# Patient Record
Sex: Male | Born: 1955 | Race: Black or African American | Hispanic: No | Marital: Married | Smoking: Former smoker
Health system: Southern US, Community
[De-identification: ages and names within clinical notes are randomized; demographics above are authoritative.]

## PROBLEM LIST (undated history)

## (undated) DIAGNOSIS — I10 Essential (primary) hypertension: Secondary | ICD-10-CM

## (undated) DIAGNOSIS — M5430 Sciatica, unspecified side: Secondary | ICD-10-CM

## (undated) DIAGNOSIS — J329 Chronic sinusitis, unspecified: Secondary | ICD-10-CM

## (undated) HISTORY — PX: HIP SURGERY: SHX245

## (undated) HISTORY — PX: BACK SURGERY: SHX140

## (undated) HISTORY — PX: HERNIA REPAIR: SHX51

## (undated) HISTORY — PX: NECK SURGERY: SHX720

---

## 2010-03-04 ENCOUNTER — Ambulatory Visit: Payer: Self-pay | Admitting: Interventional Radiology

## 2010-03-04 ENCOUNTER — Emergency Department (HOSPITAL_BASED_OUTPATIENT_CLINIC_OR_DEPARTMENT_OTHER): Admission: EM | Admit: 2010-03-04 | Discharge: 2010-03-04 | Payer: Self-pay | Admitting: Emergency Medicine

## 2012-07-23 ENCOUNTER — Emergency Department (HOSPITAL_BASED_OUTPATIENT_CLINIC_OR_DEPARTMENT_OTHER)
Admission: EM | Admit: 2012-07-23 | Discharge: 2012-07-23 | Disposition: A | Discharge status: Home / Self Care | Payer: Self-pay | Attending: Emergency Medicine | Admitting: Emergency Medicine

## 2012-07-23 ENCOUNTER — Encounter (HOSPITAL_BASED_OUTPATIENT_CLINIC_OR_DEPARTMENT_OTHER): Payer: Self-pay | Admitting: *Deleted

## 2012-07-23 DIAGNOSIS — H6120 Impacted cerumen, unspecified ear: Secondary | ICD-10-CM | POA: Insufficient documentation

## 2012-07-23 DIAGNOSIS — R42 Dizziness and giddiness: Secondary | ICD-10-CM | POA: Insufficient documentation

## 2012-07-23 MED ORDER — MECLIZINE HCL 12.5 MG PO TABS: 25.0000 mg | ORAL_TABLET | Freq: Three times a day (TID) | ORAL | Status: AC | PRN

## 2012-07-23 NOTE — ED Notes (Signed)
States that hes been dizzy all day, everytime he stands up "everything is spinning". Patient states that his ears have been "stopped up" for about 3 weeks. Denies N/V

## 2012-07-23 NOTE — ED Provider Notes (Signed)
History  This chart was scribed for Rolan Bucco, MD by Shari Heritage. The patient was seen in room MH12/MH12. Patient's care was started at 1632.     CSN: [id number]  Arrival date & time 07/23/12  1632   First MD Initiated Contact with Patient 07/23/12 1647      Chief Complaint  Patient presents with  . Dizziness    (Consider location/radiation/quality/duration/timing/severity/associated sxs/prior treatment) The history is provided by the patient. No language interpreter was used.   Phillip Hughes is a 56 y.o. male who presents to the Emergency Department complaining of dizziness onset 2 days ago. Patient says that when he woke up on Friday and tried to stand up, the room started to spin. The dizziness has been persistent today. Patient says that when he makes a movement to stand or when bends over at the waist, the spinning starts. He has also been experiencing ear congestion for 3 weeks, but there is no otalgia. Feels that his ears are "stopped up" Patient says that his hearing is normal. He states that he sometimes hears ringing in his left ear. There is no popping sensation. He says that he has balance problems, only with the spinning. No numbness or weakness to extremities.  No speech problems.  No Headache. He denies any obvious head injuries or trauma. No chest pain, abdominal pain, HA, nausea, vomiting, numbness, weakness or tingling. Patient denies nasal or sinus congestion. He has a history of osteoarthritis in his right hip. Patient reports no other significant past medical, surgical or family history.  No PCP  History reviewed. No pertinent past medical history.  History reviewed. No pertinent past surgical history.  No family history on file.  History  Substance Use Topics  . Smoking status: Never Smoker   . Smokeless tobacco: Not on file  . Alcohol Use: No      Review of Systems  Constitutional: Negative for fever, chills, diaphoresis and fatigue.  HENT: Negative  for ear pain, congestion, rhinorrhea and sneezing.   Eyes: Negative.   Respiratory: Negative for cough, chest tightness and shortness of breath.   Cardiovascular: Negative for chest pain and leg swelling.  Gastrointestinal: Negative for nausea, vomiting, abdominal pain, diarrhea and blood in stool.  Genitourinary: Negative for frequency, hematuria, flank pain and difficulty urinating.  Musculoskeletal: Negative for back pain and arthralgias.  Skin: Negative for rash.  Neurological: Positive for dizziness. Negative for speech difficulty, weakness, numbness and headaches.    Allergies  Review of patient's allergies indicates no known allergies.  Home Medications   Current Outpatient Rx  Name Route Sig Dispense Refill  . NAPROXEN 500 MG PO TABS Oral Take 500 mg by mouth 2 (two) times daily as needed. For pain    . NAPROXEN SODIUM 220 MG PO TABS Oral Take 440 mg by mouth daily as needed. For hip pain    . MECLIZINE HCL 12.5 MG PO TABS Oral Take 2 tablets (25 mg total) by mouth 3 (three) times daily as needed. 30 tablet 0    BP 130/79  Pulse 85  Temp 97.7 F (36.5 C) (Oral)  Resp 20  SpO2 99%  Physical Exam  Constitutional: He is oriented to person, place, and time. He appears well-developed and well-nourished.  HENT:  Head: Normocephalic and atraumatic.       Cerumen impactions to TMs bilaterally.  Eyes: Pupils are equal, round, and reactive to light.       Mild horizontal nystagmus, no vertical nystagmus  Neck: Normal  range of motion. Neck supple.  Cardiovascular: Normal rate, regular rhythm and normal heart sounds.   Pulmonary/Chest: Effort normal and breath sounds normal. No respiratory distress. He has no wheezes. He has no rales. He exhibits no tenderness.  Abdominal: Soft. Bowel sounds are normal. There is no tenderness. There is no rebound and no guarding.  Musculoskeletal: Normal range of motion. He exhibits no edema.  Lymphadenopathy:    He has no cervical adenopathy.    Neurological: He is alert and oriented to person, place, and time. He has normal strength. No cranial nerve deficit or sensory deficit. GCS eye subscore is 4. GCS verbal subscore is 5. GCS motor subscore is 6.       Finger to nose intact. Gait normal  Skin: Skin is warm and dry. No rash noted.  Psychiatric: He has a normal mood and affect.    ED Course  Procedures (including critical care time) DIAGNOSTIC STUDIES: Oxygen Saturation is 99% on room air, normal by my interpretation.    COORDINATION OF CARE: 4:50pm- Patient informed of current plan for treatment and evaluation and agrees with plan at this time.    Labs Reviewed - No data to display  No results found.   1. Vertigo   2. Cerumen impaction       MDM  I attempted to remove cerumen manually, but he is impacted against TM.  Doubt infection as pt does not have ear pain.  Advised to use OTC ear wax removal kit and if no improvement, referred him for f/u with ENT.  Feel that his symptoms are consistent with peripheral vertigo.  No neuro deficits.  No headache.  Gait normal.  Will tx with meclizine.        I personally performed the services described in this documentation, which was scribed in my presence.  The recorded information has been reviewed and considered.    Rolan Bucco, MD 07/23/12 [phone]

## 2012-07-23 NOTE — ED Notes (Signed)
Pt c/o dizziness "when I sit up" since Friday. Pt states that once he is standing, he doesn't experience dizziness. Pt states bilateral ears have " been stopped up" x 3 weeks. Today, he states his left ear is worse then his right.

## 2013-03-03 ENCOUNTER — Emergency Department (HOSPITAL_BASED_OUTPATIENT_CLINIC_OR_DEPARTMENT_OTHER)
Admission: EM | Admit: 2013-03-03 | Discharge: 2013-03-03 | Disposition: A | Discharge status: Home / Self Care | Payer: Self-pay | Attending: Emergency Medicine | Admitting: Emergency Medicine

## 2013-03-03 ENCOUNTER — Encounter (HOSPITAL_BASED_OUTPATIENT_CLINIC_OR_DEPARTMENT_OTHER): Payer: Self-pay | Admitting: *Deleted

## 2013-03-03 DIAGNOSIS — R21 Rash and other nonspecific skin eruption: Secondary | ICD-10-CM | POA: Insufficient documentation

## 2013-03-03 MED ORDER — TRIAMCINOLONE ACETONIDE 0.025 % EX OINT: TOPICAL_OINTMENT | Freq: Two times a day (BID) | CUTANEOUS | Status: DC

## 2013-03-03 MED ORDER — HYDROXYZINE HCL 25 MG PO TABS: 25.0000 mg | ORAL_TABLET | Freq: Four times a day (QID) | ORAL | Status: DC | PRN

## 2013-03-03 MED ORDER — PREDNISONE 10 MG PO TABS
ORAL_TABLET | ORAL | Status: AC
  Administered 2013-03-03: 10 mg
  Filled 2013-03-03: qty 1

## 2013-03-03 MED ORDER — PREDNISONE 20 MG PO TABS: 40.0000 mg | ORAL_TABLET | Freq: Every day | ORAL | Status: DC

## 2013-03-03 MED ORDER — HYDROXYZINE HCL 25 MG PO TABS
ORAL_TABLET | ORAL | Status: AC
  Administered 2013-03-03: 25 mg
  Filled 2013-03-03: qty 1

## 2013-03-03 MED ORDER — PREDNISONE 50 MG PO TABS
ORAL_TABLET | ORAL | Status: AC
  Administered 2013-03-03: 50 mg
  Filled 2013-03-03: qty 1

## 2013-03-03 NOTE — ED Notes (Addendum)
Pt states he used some new cologne on Monday. Wed began breaking out with a rash to his face, neck and arm (different-"comes every year") +itching Took Benadryl.

## 2013-03-03 NOTE — ED Provider Notes (Signed)
History     CSN: [id number]  Arrival date & time 03/03/13  1924   None     Chief Complaint  Patient presents with  . Rash    (Consider location/radiation/quality/duration/timing/severity/associated sxs/prior treatment) HPI Comments: Pt presenting to the ED for rash of neck and face after using a new colonge on Monday.  Also complains of rash to right AC fossa which he states comes every spring but will spontaneously resolve.  Has been applying cortisone cream and abx ointment to affected areas and taking benadryl without relief of sx.  No recent fever.  Patient is a 57 y.o. male presenting with rash. The history is provided by the patient.  Rash   History reviewed. No pertinent past medical history.  History reviewed. No pertinent past surgical history.  History reviewed. No pertinent family history.  History  Substance Use Topics  . Smoking status: Never Smoker   . Smokeless tobacco: Not on file  . Alcohol Use: No      Review of Systems  Skin: Positive for rash.  All other systems reviewed and are negative.    Allergies  Review of patient's allergies indicates no known allergies.  Home Medications   Current Outpatient Rx  Name  Route  Sig  Dispense  Refill  . naproxen (NAPROSYN) 500 MG tablet   Oral   Take 500 mg by mouth 2 (two) times daily as needed. For pain         . naproxen sodium (ANAPROX) 220 MG tablet   Oral   Take 440 mg by mouth daily as needed. For hip pain           BP 137/82  Pulse 85  Temp(Src) 97.8 F (36.6 C) (Oral)  Resp 20  Ht 5\' 7"  (1.702 m)  Wt 160 lb (72.576 kg)  BMI 25.05 kg/m2  SpO2 100%  Physical Exam  Nursing note and vitals reviewed. Constitutional: He is oriented to person, place, and time. He appears well-developed and well-nourished.  HENT:  Head: Normocephalic and atraumatic.  Mouth/Throat: Oropharynx is clear and moist.  Eyes: Conjunctivae and EOM are normal. Pupils are equal, round, and reactive to light.   Neck: Normal range of motion.  Cardiovascular: Normal rate, regular rhythm and normal heart sounds.   Pulmonary/Chest: Effort normal and breath sounds normal. He has no wheezes.  Abdominal: Soft. Bowel sounds are normal.  Neurological: He is alert and oriented to person, place, and time.  Skin: Skin is warm and dry. Rash noted. Rash is urticarial (face and neck).     Psychiatric: He has a normal mood and affect.    ED Course  Procedures (including critical care time)  Labs Reviewed - No data to display No results found.   1. Rash and nonspecific skin eruption       MDM   Pt presenting to the ED for rash of neck and face after using a new colonge on Monday.  Only applied to neck and face.  Has been taking benadryl without relief of sx.  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  Area is not TTP, no increased warmth, no abscess, fluctuance, or drainage noted- low suspicion for cellulitic changes.  Pt given prednisone 60mg  and atarax 25mg  in ED.  Rxs given for home use and Instructed on care of rash.  Return precautions advised.         Garlon Hatchet, PA-C 03/04/13 1642

## 2013-03-05 NOTE — ED Provider Notes (Signed)
Medical screening examination/treatment/procedure(s) were performed by non-physician practitioner and as supervising physician I was immediately available for consultation/collaboration.  Abad Manard, MD 03/05/13 0831 

## 2014-08-02 ENCOUNTER — Emergency Department (HOSPITAL_BASED_OUTPATIENT_CLINIC_OR_DEPARTMENT_OTHER)
Admission: EM | Admit: 2014-08-02 | Discharge: 2014-08-02 | Disposition: A | Discharge status: Home / Self Care | Payer: Self-pay | Attending: Emergency Medicine | Admitting: Emergency Medicine

## 2014-08-02 ENCOUNTER — Encounter (HOSPITAL_BASED_OUTPATIENT_CLINIC_OR_DEPARTMENT_OTHER): Payer: Self-pay | Admitting: Emergency Medicine

## 2014-08-02 DIAGNOSIS — IMO0002 Reserved for concepts with insufficient information to code with codable children: Secondary | ICD-10-CM | POA: Insufficient documentation

## 2014-08-02 DIAGNOSIS — R112 Nausea with vomiting, unspecified: Secondary | ICD-10-CM | POA: Insufficient documentation

## 2014-08-02 DIAGNOSIS — Y9389 Activity, other specified: Secondary | ICD-10-CM | POA: Insufficient documentation

## 2014-08-02 DIAGNOSIS — Z79899 Other long term (current) drug therapy: Secondary | ICD-10-CM | POA: Insufficient documentation

## 2014-08-02 DIAGNOSIS — X30XXXA Exposure to excessive natural heat, initial encounter: Secondary | ICD-10-CM | POA: Insufficient documentation

## 2014-08-02 DIAGNOSIS — R42 Dizziness and giddiness: Secondary | ICD-10-CM | POA: Insufficient documentation

## 2014-08-02 DIAGNOSIS — R51 Headache: Secondary | ICD-10-CM | POA: Insufficient documentation

## 2014-08-02 DIAGNOSIS — Y9289 Other specified places as the place of occurrence of the external cause: Secondary | ICD-10-CM | POA: Insufficient documentation

## 2014-08-02 DIAGNOSIS — T675XXA Heat exhaustion, unspecified, initial encounter: Secondary | ICD-10-CM | POA: Insufficient documentation

## 2014-08-02 LAB — COMPREHENSIVE METABOLIC PANEL
ALBUMIN: 3.8 g/dL (ref 3.5–5.2)
ALK PHOS: 69 U/L (ref 39–117)
ALT: 30 U/L (ref 0–53)
AST: 29 U/L (ref 0–37)
Anion gap: 15 (ref 5–15)
BILIRUBIN TOTAL: 1 mg/dL (ref 0.3–1.2)
BUN: 10 mg/dL (ref 6–23)
CALCIUM: 9.7 mg/dL (ref 8.4–10.5)
CO2: 24 mEq/L (ref 19–32)
CREATININE: 1.1 mg/dL (ref 0.50–1.35)
Chloride: 97 mEq/L (ref 96–112)
GFR, EST AFRICAN AMERICAN: 83 mL/min — AB (ref >90)
GFR, EST NON AFRICAN AMERICAN: 72 mL/min — AB (ref >90)
GLUCOSE: 118 mg/dL — AB (ref 70–99)
POTASSIUM: 4 meq/L (ref 3.7–5.3)
Sodium: 136 mEq/L — ABNORMAL LOW (ref 137–147)
Total Protein: 8.4 g/dL — ABNORMAL HIGH (ref 6.0–8.3)

## 2014-08-02 LAB — CBC
HCT: 38.2 % — ABNORMAL LOW (ref 39.0–52.0)
Hemoglobin: 12.8 g/dL — ABNORMAL LOW (ref 13.0–17.0)
MCH: 29.4 pg (ref 26.0–34.0)
MCHC: 33.5 g/dL (ref 30.0–36.0)
MCV: 87.6 fL (ref 78.0–100.0)
PLATELETS: 188 10*3/uL (ref 150–400)
RBC: 4.36 MIL/uL (ref 4.22–5.81)
RDW: 12.2 % (ref 11.5–15.5)
WBC: 13.2 10*3/uL — AB (ref 4.0–10.5)

## 2014-08-02 LAB — I-STAT CG4 LACTIC ACID, ED: Lactic Acid, Venous: 1.03 mmol/L (ref 0.5–2.2)

## 2014-08-02 LAB — CK: Total CK: 421 U/L — ABNORMAL HIGH (ref 7–232)

## 2014-08-02 MED ORDER — SODIUM CHLORIDE 0.9 % IV BOLUS (SEPSIS)
1000.0000 mL | Freq: Once | INTRAVENOUS | Status: AC
  Administered 2014-08-02: 1000 mL via INTRAVENOUS

## 2014-08-02 MED ORDER — METOCLOPRAMIDE HCL 5 MG/ML IJ SOLN
10.0000 mg | Freq: Once | INTRAMUSCULAR | Status: AC
  Administered 2014-08-02: 10 mg via INTRAVENOUS
  Filled 2014-08-02: qty 2

## 2014-08-02 MED ORDER — DIPHENHYDRAMINE HCL 50 MG/ML IJ SOLN
25.0000 mg | Freq: Once | INTRAMUSCULAR | Status: AC
  Administered 2014-08-02: 25 mg via INTRAVENOUS
  Filled 2014-08-02: qty 1

## 2014-08-02 NOTE — ED Notes (Signed)
MD at bedside. 

## 2014-08-02 NOTE — ED Notes (Signed)
Pt lying in bed with eyes closed but rouses to voice. Pt sts he feels much better than when he came in.

## 2014-08-02 NOTE — ED Provider Notes (Signed)
CSN: [id number]     Arrival date & time 08/02/14  1633 History   First MD Initiated Contact with Patient 08/02/14 1636     Chief Complaint  Patient presents with  . Dizziness     (Consider location/radiation/quality/duration/timing/severity/associated sxs/prior Treatment) Patient is a 58 y.o. male presenting with dizziness. The history is provided by the patient.  Dizziness Quality:  Lightheadedness Severity:  Moderate Onset quality:  Gradual Duration:  1 day Timing:  Constant Progression:  Unchanged Chronicity:  New Context comment:  Working outside Relieved by:  Nothing Worsened by:  Nothing tried Associated symptoms: headaches, nausea and vomiting   Associated symptoms: no chest pain and no shortness of breath     History reviewed. No pertinent past medical history. History reviewed. No pertinent past surgical history. No family history on file. History  Substance Use Topics  . Smoking status: Never Smoker   . Smokeless tobacco: Not on file  . Alcohol Use: No    Review of Systems  Constitutional: Negative for fever and chills.  Respiratory: Negative for cough and shortness of breath.   Cardiovascular: Negative for chest pain and leg swelling.  Gastrointestinal: Positive for nausea and vomiting. Negative for abdominal pain.  Skin: Negative for rash.  Neurological: Positive for dizziness and headaches.  All other systems reviewed and are negative.     Allergies  Review of patient's allergies indicates no known allergies.  Home Medications   Prior to Admission medications   Medication Sig Start Date End Date Taking? Authorizing Provider  hydrOXYzine (ATARAX/VISTARIL) 25 MG tablet Take 1 tablet (25 mg total) by mouth every 6 (six) hours as needed for itching. 03/03/13   Larene Pickett, PA-C  naproxen (NAPROSYN) 500 MG tablet Take 500 mg by mouth 2 (two) times daily as needed. For pain    Historical Provider, MD  naproxen sodium (ANAPROX) 220 MG tablet Take 440  mg by mouth daily as needed. For hip pain    Historical Provider, MD  predniSONE (DELTASONE) 20 MG tablet Take 2 tablets (40 mg total) by mouth daily. 03/03/13   Larene Pickett, PA-C  triamcinolone (KENALOG) 0.025 % ointment Apply topically 2 (two) times daily. Do not apply to face 03/03/13   Larene Pickett, PA-C   BP 129/69  Pulse 120  Temp(Src) 98 F (36.7 C) (Oral)  Resp 16  Ht 5\' 7"  (1.702 m)  Wt 155 lb (70.308 kg)  BMI 24.27 kg/m2  SpO2 98% Physical Exam  Nursing note and vitals reviewed. Constitutional: He is oriented to person, place, and time. He appears well-developed and well-nourished. No distress.  HENT:  Head: Normocephalic and atraumatic.  Mouth/Throat: Oropharynx is clear and moist. No oropharyngeal exudate.  Eyes: EOM are normal. Pupils are equal, round, and reactive to light.  Neck: Normal range of motion. Neck supple.  Cardiovascular: Regular rhythm.  Tachycardia present.  Exam reveals no friction rub.   No murmur heard. Pulmonary/Chest: Effort normal and breath sounds normal. No respiratory distress. He has no wheezes. He has no rales.  Abdominal: He exhibits no distension. There is no tenderness. There is no rebound.  Musculoskeletal: Normal range of motion. He exhibits no edema.  Neurological: He is alert and oriented to person, place, and time. No cranial nerve deficit. He exhibits normal muscle tone. Coordination normal.  Skin: Skin is warm. No rash noted. He is not diaphoretic.    ED Course  Procedures (including critical care time) Labs Review Labs Reviewed  CBC - Abnormal; Notable  for the following:    WBC 13.2 (*)    Hemoglobin 12.8 (*)    HCT 38.2 (*)    All other components within normal limits  COMPREHENSIVE METABOLIC PANEL - Abnormal; Notable for the following:    Sodium 136 (*)    Glucose, Bld 118 (*)    Total Protein 8.4 (*)    GFR calc non Af Amer 72 (*)    GFR calc Af Amer 83 (*)    All other components within normal limits  CK -  Abnormal; Notable for the following:    Total CK 421 (*)    All other components within normal limits  I-STAT CG4 LACTIC ACID, ED    Imaging Review No results found.   EKG Interpretation None      MDM   Final diagnoses:  Heat exhaustion, initial encounter    32M here with headache, N/V. All began yesterday while working outside, began at that time. Unable to get cooled off last night. Vomited today. Persistent frontal headache that gradually started once he began feeling bad. No hx of headaches. No fevers, SOB, CP. Here afebrile, tachycardic. Normal BPs. Exam benign, normal cranial nerves, normal strength and sensation. Concern for dehydration, possible heat exhaustion. Will check labs, give fluids, given headache medicine. HA improving. Labs with elevated CK, normal otherwise. HRs improving with fluids. Clinical picture c/w heat exhaustion. With improvement, stable for discharge, given resource guide to establish f/u.  Evelina Bucy, MD 08/02/14 2011

## 2014-08-02 NOTE — Discharge Instructions (Signed)
Heat-Related Illness °Heat-related illnesses occur when the body is unable to properly cool itself. The body normally cools itself by sweating. However, under some conditions sweating is not enough. In these cases, a person's body temperature rises rapidly. Very high body temperatures may damage the brain or other vital organs. Some examples of heat-related illnesses include: °· Heat stroke. This occurs when the body is unable to regulate its temperature. The body's temperature rises rapidly, the sweating mechanism fails, and the body is unable to cool down. Body temperature may rise to 106° F (41° C) or higher within 10 to 15 minutes. Heat stroke can cause death or permanent disability if emergency treatment is not provided. °· Heat exhaustion. This is a milder form of heat-related illness that can develop after several days of exposure to high temperatures and not enough fluids. It is the body's response to an excessive loss of the water and salt contained in sweat. °· Heat cramps. These usually affect people who sweat a lot during heavy activity. This sweating drains the body's salt and moisture. The low salt level in the muscles causes painful cramps. Heat cramps may also be a symptom of heat exhaustion. Heat cramps usually occur in the abdomen, arms, or legs. Get medical attention for cramps if you have heart problems or are on a low-sodium diet. °Those that are at greatest risk for heat-related illnesses include:  °· The elderly. °· Infant and the very young. °· People with mental illness and chronic diseases. °· People who are overweight (obese). °· Young and healthy people can even succumb to heat if they participate in strenuous physical activities during hot weather. °CAUSES  °Several factors affect the body's ability to cool itself during extremely hot weather. When the humidity is high, sweat will not evaporate as quickly. This prevents the body from releasing heat quickly. Other factors that can affect  the body's ability to cool down include:  °· Age. °· Obesity. °· Fever. °· Dehydration. °· Heart disease. °· Mental illness. °· Poor circulation. °· Sunburn. °· Prescription drug use. °· Alcohol use. °SYMPTOMS  °Heat stroke: Warning signs of heat stroke vary, but may include: °· An extremely high body temperature (above 103°F orally). °· A fast, strong pulse. °· Dizziness. °· Confusion. °· Red, hot, and dry skin. °· No sweating. °· Throbbing headache. °· Feeling sick to your stomach (nauseous). °· Unconsciousness. °Heat exhaustion: Warning signs of heat exhaustion include: °· Heavy sweating. °· Tiredness. °· Headache. °· Paleness. °· Weakness. °· Feeling sick to your stomach (nauseous) or vomiting. °· Muscle cramps. °Heat cramps °· Muscle pains or spasms. °TREATMENT  °Heat stroke °· Get into a cool environment. An indoor place that is air-conditioned may be best. °· Take a cool shower or bath. Have someone around to make sure you are okay. °· Take your temperature. Make sure it is going down. °Heat exhaustion °· Drink plenty of fluids. Do not drink liquids that contain caffeine, alcohol, or large amounts of sugar. These cause you to lose more body fluid. Also, avoid very cold drinks. They can cause stomach cramps. °· Get into a cool environment. An indoor place that is air-conditioned may be best. °· Take a cool shower or bath. Have someone around to make sure you are okay. °· Put on lightweight clothing. °Heat cramps °· Stop whatever activity you were doing. Do not attempt to do that activity for at least 3 hours after the cramps have gone away. °· Get into a cool environment. An indoor   place that is air-conditioned may be best. °HOME CARE INSTRUCTIONS  °To protect your health when temperatures are extremely high, follow these tips: °· During heavy exercise in a hot environment, drink two to four glasses (16-32 ounces) of cool fluids each hour. Do not wait until you are thirsty to drink. Warning: If your caregiver  limits the amount of fluid you drink or has you on water pills, ask how much you should drink while the weather is hot. °· Do not drink liquids that contain caffeine, alcohol, or large amounts of sugar. These cause you to lose more body fluid. °· Avoid very cold drinks. They can cause stomach cramps. °· Wear appropriate clothing. Choose lightweight, light-colored, loose-fitting clothing. °· If you must be outdoors, try to limit your outdoor activity to morning and evening hours. Try to rest often in shady areas. °· If you are not used to working or exercising in a hot environment, start slowly and pick up the pace gradually. °· Stay cool in an air-conditioned place if possible. If your home does not have air conditioning, go to the shopping mall or public library. °· Taking a cool shower or bath may help you cool off. °SEEK MEDICAL CARE IF:  °· You see any of the symptoms listed above. You may be dealing with a life-threatening emergency. °· Symptoms worsen or last longer than 1 hour. °· Heat cramps do not get better in 1 hour. °MAKE SURE YOU:  °· Understand these instructions. °· Will watch your condition. °· Will get help right away if you are not doing well or get worse. °Document Released: 09/07/2008 Document Revised: 02/21/2012 Document Reviewed: 09/07/2008 °ExitCare® Patient Information ©2015 [employer]. This information is not intended to replace advice given to you by your health care provider. Make sure you discuss any questions you have with your health care provider. ° °Emergency Department Resource Guide °1) Find a Doctor and Pay Out of Pocket °Although you won't have to find out who is covered by your insurance plan, it is a good idea to ask around and get recommendations. You will then need to call the office and see if the doctor you have chosen will accept you as a new patient and what types of options they offer for patients who are self-pay. Some doctors offer discounts or will set up payment  plans for their patients who do not have insurance, but you will need to ask so you aren't surprised when you get to your appointment. ° °2) Contact Your Local Health Department °Not all health departments have doctors that can see patients for sick visits, but many do, so it is worth a call to see if yours does. If you don't know where your local health department is, you can check in your phone book. The CDC also has a tool to help you locate your state's health department, and many state websites also have listings of all of their local health departments. ° °3) Find a Walk-in Clinic °If your illness is not likely to be very severe or complicated, you may want to try a walk in clinic. These are popping up all over the country in pharmacies, drugstores, and shopping centers. They're usually staffed by nurse practitioners or physician assistants that have been trained to treat common illnesses and complaints. They're usually fairly quick and inexpensive. However, if you have serious medical issues or chronic medical problems, these are probably not your best option. ° °No Primary Care Doctor: °- Call Health Connect at    [phone] - they can help you locate a primary care doctor that  accepts your insurance, provides certain services, etc. °- Physician Referral Service- 1-800-533-3463 ° °Chronic Pain Problems: °Organization         Address  Phone   Notes  °[facility]  ([phone]) [phone] Patients need to be referred by their primary care doctor.  ° °Medication Assistance: °Organization         Address  Phone   Notes  °[place] [facility] [address] °[place], [place] [address] (336) 641-8030 --Must be a resident of [place] °-- Must have NO insurance coverage whatsoever (no Medicaid/ Medicare, etc.) °-- The pt. MUST have a primary care doctor that directs their care regularly and follows them in the community °  °[facility]  (866) 331-1348   °[employer]   (888) 892-1162   ° °Agencies that provide inexpensive medical care: °Organization         Address  Phone   Notes  °[facility]  ([phone]) 832-8035   °[facility]    (336) 832-7272   °[facility] [address] °[place], [place] [address] (336) 832-4777   °[facility] [place] [address], °[place] (336) 271-4999   °[facility]    (336) 373-0678   °[facility]    (336) 272-1050   °[facility] ° [address], [place] Phone:  (336) 832-4444, Fax:  (336) [phone] Hours of Operation:  9 am - 6 pm, M-F.  Also accepts Medicaid/Medicare and self-pay.  °[facility] ° [address], [place] Phone: (336) [phone], Fax: (336) [phone]. Hours of Operation:  8:30 am - 5:30 pm, M-F.  Also accepts Medicaid and self-pay.  °[facility] [place] [address], [place] Phone: (336) 878-6027   °[facility] [address], [place], [place] (336)723-1848, Ext. 123 Mondays & Thursdays: 7-9 AM.  First 15 patients are seen on a first come, first serve basis. °  ° °Medicaid-accepting [place] Providers: ° °Organization         Address  Phone   Notes  °[facility] [address], [place] ([phone]) [phone] Also accepts self-pay patients.  °[facility] [address], [place] ° (336) [phone]   °[facility] [address], [place] (336) 288-8857   °[facility] [address] [place] [address], [place] (336) [phone]   °Veita Bland [address], [place]  ° (336) [phone] Only accepts [place] Access Medicaid patients after they have their name applied to their card.  ° °Self-Pay (no insurance) in [place]: ° °Organization         Address  Phone   Notes  °Sickle Cell Patients, [facility] [address], [place] ([phone])  [phone]   °[facility] [address], [place] (336) 832-4400   °[facility] [place] ° [address], [place] (336) 992-4800   °[facility]/Dr. Osei-Bonsu ° [address] [place] [address], [place] or [address], [place] ([phone]) [phone] Phone number for both [place] and [place] locations is the same.  °[facility] [address], [place] (336) [phone]   °[facility] [address] [place] [address],    or [address] (336) 852-7530 °(336) 878-2260   °[facility] [address], [place] (336) 350-1642, phone; (336) 294-5005, fax Sees patients 1st and 3rd Saturday of every month.  Must not qualify for public or private insurance (i.e. Medicaid, Medicare, [place] Health Choice, Veterans' Benefits) • Household income should be no more than 200% of the poverty level •The clinic cannot treat you if you are pregnant or think you are pregnant • Sexually transmitted diseases are not treated at the clinic.  ° ° °Dental Care: °Organization         Address  Phone  Notes  °[place] [facility] [address], [place] (336) 641-6152 Accepts children up to age 21 who are enrolled in Medicaid or [place] Health Choice; pregnant women with a Medicaid card; and children who have applied for Medicaid or [place] Health Choice, but were declined, whose parents can pay a reduced fee at time of service.  °[place] [place]  [address], [place] (336) 641-7733 Accepts children up to age 21 who are enrolled in Medicaid or [place] Health Choice; pregnant women with a Medicaid card; and children who have applied for Medicaid or [place] Health Choice, but were declined, whose parents can pay a reduced fee at time of service.  °[facility] ° [address], [place] (336) 641-4533 Patients are  seen by appointment only. Walk-ins are not accepted. [facility] will see patients 18 years of age and older. °Monday - Tuesday (8am-5pm) °Most Wednesdays (8:30-5pm) °$30 per visit, cash only  °[facility] ° [address], [place] (336) [phone] Patients are seen by appointment only. Walk-ins are not accepted. [facility] will see patients 18 years of age and older. °One Wednesday Evening (Monthly: Volunteer Based).  $30 per visit, cash only  °[facility]  (919) [phone] for adults; Children under age 4, call Graduate Pediatric Dentistry at ([phone]) [phone]. Children aged 4-14, please call ([phone]) [phone] to request a pediatric application. ° Dental services are provided in all areas of dental care including fillings, crowns and bridges, complete and partial dentures, implants, gum treatment, root canals, and extractions. Preventive care is also provided. Treatment is provided to both adults and children. °Patients are selected via a lottery and there is often a waiting list. °  °[facility] [address], °[place] ° (336) 763-8833 [email or web address] °  °[facility] [address], [place], [place] (336)723-1848, Ext. 123 Second and Fourth Thursday of each month, opens at 6:30 AM; Clinic ends at 9 AM.  Patients are seen on a first-come first-served basis, and a limited number are seen during each clinic.  ° °[facility] ° [address], [place], [place] (336) [phone]   Eligibility Requirements °You must have lived in [place], [place], or [place] counties for at least the last three months. °  You cannot be eligible for state or federal sponsored healthcare insurance, including Veterans Administration, Medicaid, or Medicare. °  You generally cannot be eligible for healthcare insurance through your employer.  °  How to apply: °Eligibility screenings are held every Tuesday and Wednesday afternoon from 1:00 pm until 4:00  pm. You do not need an appointment for the interview!  °[facility] [address], [place], [place] [phone]   °[facility]  336-342-8273   °  [place] [facility]  [place]  867-511-2993    Behavioral Health Resources in the Community: Intensive Outpatient Programs Organization         Address  Phone  Notes  [facility] [address] [place], [place] 719-134-6934   [facility] Outpatient [address] [place], [place]   [facility] [address], [place], [place]   [place] [address],  [place], [place] or (410)729-7919   Substance Abuse Resources Organization         Address  Phone  Notes  [facility]  725-167-0162   [place]  (501)436-6992   [facility]   Chinita Pester  (984)769-6666   Residential & Outpatient Substance Abuse Program  (712)360-3116   Psychological Services Organization         Address  Phone  Notes  [facility]  [place]  709-743-4845   [place] [address] [place] or 810-421-1211    Mobile Crisis Teams Organization         Address  Phone  Notes  [facility], Mobile Crisis Care Unit  731-783-9109   [facility]  [address] [place], [place]   [place] [address], [place] [phone]    Self-Help/Support Groups Organization         Address  Phone             Notes  [place] [place] - variety of support groups  [place] Call for more information  Narcotics Anonymous (NA), [facility] [address], [employer] [place]  2 meetings at this location   Special educational needs teacher          Address  Phone  Notes  [facility] [place],    [place]  1-(614)804-9010   [facility]  [address], [place] [address], [place], [place]   [place], [place] [phone] Admissions: 8am-3pm M-F  Incentives Substance [place] [address]    [place], [place] [phone]   [facility] [address] [place], [place], [place]   [facility] [address],  [place], [place]   [facility] - Intensive Outpatient [address], [place], [place]   [facility] [place]  [place], [place] [phone] or 6151632354   [facility] [address], [place], [place] Accepts Medicaid  [facility] [facility] [address],  [place] [phone] Substance Abuse/Addiction Treatment   [facility] Organization         Address  Phone  Notes  [facility]  629-381-7717   Domenic Schwab, PhD [address] [place], [place]   (424) [phone] or 458-722-2082   Maynard Crewe [place], [place] [phone]   [place] [address], [place], [place] [phone] Insurance/Medicaid/sponsorship through [employer] and Families [address], [place] [address]  [place], [place] [phone] [place], [place] [phone]    Dr. Adele Schilder  [phone]   [facility] Dept. 1) [address] [place] 2) [place] 3)  [address], [place] [phone]  [phone]   [place] ([phone]) [phone] or [phone] (After Hours)

## 2014-08-02 NOTE — ED Notes (Signed)
Reports dizziness since yesterday when working in sun. Sts some nausea and vomiting. Pt ambulatory to room.

## 2014-08-05 ENCOUNTER — Encounter (HOSPITAL_BASED_OUTPATIENT_CLINIC_OR_DEPARTMENT_OTHER): Payer: Self-pay | Admitting: Emergency Medicine

## 2014-08-05 ENCOUNTER — Emergency Department (HOSPITAL_BASED_OUTPATIENT_CLINIC_OR_DEPARTMENT_OTHER)
Admission: EM | Admit: 2014-08-05 | Discharge: 2014-08-05 | Disposition: A | Discharge status: Home / Self Care | Payer: Self-pay | Attending: Emergency Medicine | Admitting: Emergency Medicine

## 2014-08-05 DIAGNOSIS — L03315 Cellulitis of perineum: Secondary | ICD-10-CM

## 2014-08-05 DIAGNOSIS — L259 Unspecified contact dermatitis, unspecified cause: Secondary | ICD-10-CM | POA: Insufficient documentation

## 2014-08-05 DIAGNOSIS — L03319 Cellulitis of trunk, unspecified: Principal | ICD-10-CM

## 2014-08-05 DIAGNOSIS — K6289 Other specified diseases of anus and rectum: Secondary | ICD-10-CM | POA: Insufficient documentation

## 2014-08-05 DIAGNOSIS — L02219 Cutaneous abscess of trunk, unspecified: Secondary | ICD-10-CM | POA: Insufficient documentation

## 2014-08-05 DIAGNOSIS — IMO0002 Reserved for concepts with insufficient information to code with codable children: Secondary | ICD-10-CM | POA: Insufficient documentation

## 2014-08-05 LAB — CBC WITH DIFFERENTIAL/PLATELET
BASOS PCT: 0 % (ref 0–1)
Basophils Absolute: 0 10*3/uL (ref 0.0–0.1)
EOS PCT: 1 % (ref 0–5)
Eosinophils Absolute: 0.1 10*3/uL (ref 0.0–0.7)
HCT: 34.9 % — ABNORMAL LOW (ref 39.0–52.0)
HEMOGLOBIN: 11.8 g/dL — AB (ref 13.0–17.0)
LYMPHS ABS: 1 10*3/uL (ref 0.7–4.0)
LYMPHS PCT: 14 % (ref 12–46)
MCH: 29.1 pg (ref 26.0–34.0)
MCHC: 33.8 g/dL (ref 30.0–36.0)
MCV: 86 fL (ref 78.0–100.0)
Monocytes Absolute: 0.8 10*3/uL (ref 0.1–1.0)
Monocytes Relative: 11 % (ref 3–12)
Neutro Abs: 5.1 10*3/uL (ref 1.7–7.7)
Neutrophils Relative %: 74 % (ref 43–77)
PLATELETS: 197 10*3/uL (ref 150–400)
RBC: 4.06 MIL/uL — AB (ref 4.22–5.81)
RDW: 11.5 % (ref 11.5–15.5)
WBC: 6.9 10*3/uL (ref 4.0–10.5)

## 2014-08-05 LAB — COMPREHENSIVE METABOLIC PANEL
ALT: 39 U/L (ref 0–53)
ANION GAP: 14 (ref 5–15)
AST: 58 U/L — ABNORMAL HIGH (ref 0–37)
Albumin: 3.2 g/dL — ABNORMAL LOW (ref 3.5–5.2)
Alkaline Phosphatase: 61 U/L (ref 39–117)
BUN: 11 mg/dL (ref 6–23)
CHLORIDE: 97 meq/L (ref 96–112)
CO2: 25 mEq/L (ref 19–32)
CREATININE: 0.9 mg/dL (ref 0.50–1.35)
Calcium: 9.8 mg/dL (ref 8.4–10.5)
GFR calc non Af Amer: >90 mL/min (ref >90)
GLUCOSE: 125 mg/dL — AB (ref 70–99)
POTASSIUM: 3.6 meq/L — AB (ref 3.7–5.3)
SODIUM: 136 meq/L — AB (ref 137–147)
Total Bilirubin: 1 mg/dL (ref 0.3–1.2)
Total Protein: 8.3 g/dL (ref 6.0–8.3)

## 2014-08-05 MED ORDER — HYDROMORPHONE HCL PF 1 MG/ML IJ SOLN
1.0000 mg | Freq: Once | INTRAMUSCULAR | Status: AC
  Administered 2014-08-05: 1 mg via INTRAVENOUS
  Filled 2014-08-05: qty 1

## 2014-08-05 MED ORDER — SODIUM CHLORIDE 0.9 % IV SOLN
Freq: Once | INTRAVENOUS | Status: AC
  Administered 2014-08-05: 18:00:00 via INTRAVENOUS

## 2014-08-05 MED ORDER — CEPHALEXIN 500 MG PO CAPS: 500.0000 mg | ORAL_CAPSULE | Freq: Four times a day (QID) | ORAL | Status: DC

## 2014-08-05 MED ORDER — CEFTRIAXONE SODIUM 1 G IJ SOLR
INTRAMUSCULAR | Status: AC
  Filled 2014-08-05: qty 10

## 2014-08-05 MED ORDER — ONDANSETRON HCL 4 MG/2ML IJ SOLN
4.0000 mg | Freq: Once | INTRAMUSCULAR | Status: AC
  Administered 2014-08-05: 4 mg via INTRAVENOUS
  Filled 2014-08-05: qty 2

## 2014-08-05 MED ORDER — HYDROCODONE-ACETAMINOPHEN 5-325 MG PO TABS: 2.0000 | ORAL_TABLET | ORAL | Status: DC | PRN

## 2014-08-05 MED ORDER — CEFTRIAXONE SODIUM 1 G IJ SOLR
1.0000 g | Freq: Once | INTRAMUSCULAR | Status: AC
  Administered 2014-08-05: 1 g via INTRAVENOUS

## 2014-08-05 MED ORDER — SULFAMETHOXAZOLE-TRIMETHOPRIM 800-160 MG PO TABS: 1.0000 | ORAL_TABLET | Freq: Two times a day (BID) | ORAL | Status: AC

## 2014-08-05 NOTE — ED Notes (Signed)
C/o scrotal pain x 3 days-denies injry

## 2014-08-05 NOTE — ED Provider Notes (Signed)
CSN: [id number]     Arrival date & time 08/05/14  1629 History   First MD Initiated Contact with Patient 08/05/14 1718     Chief Complaint  Patient presents with  . Rectal Pain     (Consider location/radiation/quality/duration/timing/severity/associated sxs/prior Treatment) Patient is a 58 y.o. male presenting with rash. The history is provided by the patient. No language interpreter was used.  Rash Location:  Ano-genital Ano-genital rash location:  Groin Quality: redness and swelling   Severity:  Moderate Onset quality:  Gradual Duration:  2 days Timing:  Constant Progression:  Worsening Chronicity:  New Relieved by:  Nothing Worsened by:  Nothing tried Ineffective treatments:  None tried Associated symptoms: no nausea     History reviewed. No pertinent past medical history. Past Surgical History  Procedure Laterality Date  . Hernia repair     No family history on file. History  Substance Use Topics  . Smoking status: Never Smoker   . Smokeless tobacco: Not on file  . Alcohol Use: No    Review of Systems  Gastrointestinal: Negative for nausea.  Skin: Positive for rash.  All other systems reviewed and are negative.     Allergies  Review of patient's allergies indicates no known allergies.  Home Medications   Prior to Admission medications   Medication Sig Start Date End Date Taking? Authorizing Provider  hydrOXYzine (ATARAX/VISTARIL) 25 MG tablet Take 1 tablet (25 mg total) by mouth every 6 (six) hours as needed for itching. 03/03/13   Larene Pickett, PA-C  naproxen (NAPROSYN) 500 MG tablet Take 500 mg by mouth 2 (two) times daily as needed. For pain    Historical Provider, MD  naproxen sodium (ANAPROX) 220 MG tablet Take 440 mg by mouth daily as needed. For hip pain    Historical Provider, MD  predniSONE (DELTASONE) 20 MG tablet Take 2 tablets (40 mg total) by mouth daily. 03/03/13   Larene Pickett, PA-C  triamcinolone (KENALOG) 0.025 % ointment Apply  topically 2 (two) times daily. Do not apply to face 03/03/13   Larene Pickett, PA-C   BP 115/81  Pulse 110  Temp(Src) 98.3 F (36.8 C) (Oral)  Resp 20  Ht 5\' 7"  (1.702 m)  Wt 155 lb (70.308 kg)  BMI 24.27 kg/m2  SpO2 97% Physical Exam  Nursing note and vitals reviewed. Constitutional: He is oriented to person, place, and time. He appears well-developed and well-nourished.  HENT:  Head: Normocephalic and atraumatic.  Eyes: EOM are normal. Pupils are equal, round, and reactive to light.  Neck: Normal range of motion.  Cardiovascular: Normal rate and normal heart sounds.   Pulmonary/Chest: Effort normal.  Abdominal: He exhibits no distension.  Musculoskeletal: Normal range of motion.  Neurological: He is alert and oriented to person, place, and time.  Skin: There is erythema.  Erythema perineal area  No scrotal or rectal involvement.  Tender firm area.  Warm to touch,  (pt has eczema with open areas legs and base of scrotm)  Psychiatric: He has a normal mood and affect.    ED Course  Procedures (including critical care time) Labs Review Labs Reviewed  CBC WITH DIFFERENTIAL - Abnormal; Notable for the following:    RBC 4.06 (*)    Hemoglobin 11.8 (*)    HCT 34.9 (*)    All other components within normal limits  COMPREHENSIVE METABOLIC PANEL - Abnormal; Notable for the following:    Sodium 136 (*)    Potassium 3.6 (*)  Glucose, Bld 125 (*)    Albumin 3.2 (*)    AST 58 (*)    All other components within normal limits    Imaging Review No results found.   EKG Interpretation None      MDM I suspect cellulitis is second to open areas due to eczema.   I do not feel a fluctuant area.     Final diagnoses:  None    Pt given IV rocephin and pain medication.   Pt feels better.   Pt given rx for bactrim and keflex.  Hydrocodone for pain.   Pt advised to recheck here tomorrow    Fransico Meadow, PA-C 08/05/14 1856

## 2014-08-05 NOTE — Discharge Instructions (Signed)

## 2014-08-05 NOTE — ED Provider Notes (Signed)
History/physical exam/procedure(s) were performed by non-physician practitioner and as supervising physician I was immediately available for consultation/collaboration. I have reviewed all notes and am in agreement with care and plan.   Shaune Pollack, MD 08/05/14 2027

## 2014-08-06 ENCOUNTER — Emergency Department (HOSPITAL_BASED_OUTPATIENT_CLINIC_OR_DEPARTMENT_OTHER): Payer: Self-pay

## 2014-08-06 ENCOUNTER — Emergency Department (HOSPITAL_BASED_OUTPATIENT_CLINIC_OR_DEPARTMENT_OTHER)
Admission: EM | Admit: 2014-08-06 | Discharge: 2014-08-06 | Disposition: A | Discharge status: Home / Self Care | Payer: Self-pay | Attending: Emergency Medicine | Admitting: Emergency Medicine

## 2014-08-06 ENCOUNTER — Encounter (HOSPITAL_BASED_OUTPATIENT_CLINIC_OR_DEPARTMENT_OTHER): Payer: Self-pay | Admitting: Emergency Medicine

## 2014-08-06 DIAGNOSIS — L02215 Cutaneous abscess of perineum: Secondary | ICD-10-CM

## 2014-08-06 DIAGNOSIS — Z792 Long term (current) use of antibiotics: Secondary | ICD-10-CM | POA: Insufficient documentation

## 2014-08-06 DIAGNOSIS — Z4800 Encounter for change or removal of nonsurgical wound dressing: Secondary | ICD-10-CM | POA: Insufficient documentation

## 2014-08-06 DIAGNOSIS — Z79899 Other long term (current) drug therapy: Secondary | ICD-10-CM | POA: Insufficient documentation

## 2014-08-06 DIAGNOSIS — K612 Anorectal abscess: Secondary | ICD-10-CM | POA: Insufficient documentation

## 2014-08-06 DIAGNOSIS — IMO0002 Reserved for concepts with insufficient information to code with codable children: Secondary | ICD-10-CM | POA: Insufficient documentation

## 2014-08-06 MED ORDER — IOHEXOL 300 MG/ML  SOLN
100.0000 mL | Freq: Once | INTRAMUSCULAR | Status: AC | PRN
  Administered 2014-08-06: 100 mL via INTRAVENOUS

## 2014-08-06 MED ORDER — SODIUM CHLORIDE 0.9 % IV BOLUS (SEPSIS)
250.0000 mL | Freq: Once | INTRAVENOUS | Status: AC
  Administered 2014-08-06: 250 mL via INTRAVENOUS

## 2014-08-06 MED ORDER — SODIUM CHLORIDE 0.9 % IV SOLN: INTRAVENOUS | Status: DC

## 2014-08-06 MED ORDER — HYDROCODONE-ACETAMINOPHEN 5-325 MG PO TABS
1.0000 | ORAL_TABLET | Freq: Once | ORAL | Status: AC
Start: 2014-08-06 — End: 2014-08-06
  Administered 2014-08-06: 1 via ORAL
  Filled 2014-08-06: qty 1

## 2014-08-06 MED ORDER — ONDANSETRON HCL 4 MG/2ML IJ SOLN
4.0000 mg | Freq: Once | INTRAMUSCULAR | Status: AC
  Administered 2014-08-06: 4 mg via INTRAVENOUS
  Filled 2014-08-06: qty 2

## 2014-08-06 NOTE — Discharge Instructions (Signed)
Call Gen. surgery tomorrow referral information provided. For followup. Continue the antibiotics. Continue soaks and warm water. Return for any new or worse symptoms return for any swelling in the scrotal area immediately. Return for fevers return for persistent nausea vomiting or worsening of the swelling in the area. CT scan shows a abscess in the soft tissue tear that will require drainage

## 2014-08-06 NOTE — ED Notes (Signed)
Pt. Reports he is back for recheck.  Pt. Reports he has been taking his meds given yesterday.

## 2014-08-06 NOTE — ED Notes (Signed)
Pt discharged to home with family. NAD.  

## 2014-08-06 NOTE — ED Provider Notes (Addendum)
CSN: [id number]     Arrival date & time 08/06/14  1707 History   First MD Initiated Contact with Patient 08/06/14 1709     Chief Complaint  Patient presents with  . Follow-up     (Consider location/radiation/quality/duration/timing/severity/associated sxs/prior Treatment) The history is provided by the patient.   58 year old male seen August 21 no evidence of any swelling in the groin or buttocks area then. Seen August 24 13/21 24th he had developed some discomfort from below the right testicle back towards his anal area. Patient's been soaking it in warm water. Yesterday he was started on antibiotics Keflex and Septra. Patient here for recheck symptoms aren't any worse. Has no scrotal swelling not running fevers no nausea no vomiting no bowel pain no back pain. Patient urinating fine. Patient's past medical history is negative. Patient was started on Keflex and Septra stated above. Patient has some mild discomfort in the area probably 2-3/10.  History reviewed. No pertinent past medical history. Past Surgical History  Procedure Laterality Date  . Hernia repair     No family history on file. History  Substance Use Topics  . Smoking status: Never Smoker   . Smokeless tobacco: Not on file  . Alcohol Use: No    Review of Systems  Constitutional: Negative for fever.  HENT: Negative for congestion.   Eyes: Negative for visual disturbance.  Respiratory: Negative for shortness of breath.   Cardiovascular: Negative for chest pain.  Gastrointestinal: Negative for nausea, vomiting and abdominal pain.  Genitourinary: Negative for dysuria, urgency, flank pain, discharge, penile swelling, scrotal swelling, genital sores and testicular pain.  Musculoskeletal: Negative for back pain.  Skin: Negative for rash.  Neurological: Negative for headaches.  Hematological: Does not bruise/bleed easily.  Psychiatric/Behavioral: Negative for confusion.      Allergies  Review of patient's allergies  indicates no known allergies.  Home Medications   Prior to Admission medications   Medication Sig Start Date End Date Taking? Authorizing Provider  cephALEXin (KEFLEX) 500 MG capsule Take 1 capsule (500 mg total) by mouth 4 (four) times daily. 08/05/14   Fransico Meadow, PA-C  HYDROcodone-acetaminophen (NORCO/VICODIN) 5-325 MG per tablet Take 2 tablets by mouth every 4 (four) hours as needed. 08/05/14   Fransico Meadow, PA-C  hydrOXYzine (ATARAX/VISTARIL) 25 MG tablet Take 1 tablet (25 mg total) by mouth every 6 (six) hours as needed for itching. 03/03/13   Larene Pickett, PA-C  naproxen (NAPROSYN) 500 MG tablet Take 500 mg by mouth 2 (two) times daily as needed. For pain    Historical Provider, MD  naproxen sodium (ANAPROX) 220 MG tablet Take 440 mg by mouth daily as needed. For hip pain    Historical Provider, MD  predniSONE (DELTASONE) 20 MG tablet Take 2 tablets (40 mg total) by mouth daily. 03/03/13   Larene Pickett, PA-C  sulfamethoxazole-trimethoprim (BACTRIM DS,SEPTRA DS) 800-160 MG per tablet Take 1 tablet by mouth 2 (two) times daily. 08/05/14 08/12/14  Fransico Meadow, PA-C  triamcinolone (KENALOG) 0.025 % ointment Apply topically 2 (two) times daily. Do not apply to face 03/03/13   Larene Pickett, PA-C   BP 132/75  Pulse 82  Temp(Src) 98.2 F (36.8 C) (Oral)  Resp 16  SpO2 98% Physical Exam  Nursing note and vitals reviewed. Constitutional: He is oriented to person, place, and time. He appears well-developed and well-nourished.  HENT:  Head: Normocephalic and atraumatic.  Mouth/Throat: Oropharynx is clear and moist.  Eyes: Conjunctivae and EOM are  normal. Pupils are equal, round, and reactive to light.  Neck: Normal range of motion.  Cardiovascular: Normal rate, regular rhythm and normal heart sounds.   Pulmonary/Chest: Effort normal and breath sounds normal. No respiratory distress.  Abdominal: Soft. Bowel sounds are normal. There is no tenderness.  Genitourinary: Penis normal.   Normal penis. Scrotum with no swelling no tenderness testicles nontender no mass. No groin hernias. Perineal area on the posterior aspect of the scrotum to the perianal area on the right side has an area of induration. No drainage possible some tenderness but no severe tenderness. Again no scrotal swelling.  Musculoskeletal: Normal range of motion.  Neurological: He is alert and oriented to person, place, and time. No cranial nerve deficit. He exhibits abnormal muscle tone. Coordination normal.  Skin: Skin is warm. No rash noted.    ED Course  Procedures (including critical care time) Labs Review Labs Reviewed - No data to display  Imaging Review Ct Abdomen Pelvis W Contrast  08/06/2014   CLINICAL DATA:  Rectal and scrotal pain 3 days.  No injury.  EXAM: CT ABDOMEN AND PELVIS WITH CONTRAST  TECHNIQUE: Multidetector CT imaging of the abdomen and pelvis was performed using the standard protocol following bolus administration of intravenous contrast.  CONTRAST:  168mL OMNIPAQUE IOHEXOL 300 MG/ML  SOLN  COMPARISON:  None.  FINDINGS: Lung bases are within normal.  Abdominal images demonstrate a normal liver, spleen, gallbladder, pancreas and adrenal glands. Kidneys are normal in size without hydronephrosis or nephrolithiasis. There is a sub cm hypodensity over the upper pole right renal cortex too small to characterize but likely a cyst. Ureters are within normal. The appendix is normal. There is minimal calcified plaque over the abdominal aorta.  Pelvic images demonstrate a normal rectum, prostate and bladder. There are a few prominent bilateral inguinal and external iliac lymph nodes. Additional images inferior to the pelvis demonstrate subcutaneous edema over the posterior medial right inferior gluteal region worse towards the midline extending towards the posterior scrotum. There is a slightly rim enhancing oval fluid collection immediately deep to the skin just right of midline over the medial right  inferior gluteal crease/perineum measuring 2.2 x 6.6 cm in its transverse in AP dimensions. This area is only evaluated on the axial images.  There mild degenerate changes of the spine. There is severe degenerative change of the right hip and mild degenerate change of the left hip.  IMPRESSION: Oval rim enhancing fluid collection just right of midline over the inferior medial gluteal crease/perineum at the junction to the posterior scrotum measuring 2.2 x 6.6 cm in transverse and AP dimensions. There is adjacent subcutaneous edema over the posterior medial inferior gluteal region as well as mild reactive superficial inguinal and external iliac lymph nodes. Findings are likely due to the abscess/ infection.  Sub cm right renal cyst.  These results were called by telephone at the time of interpretation on 08/06/2014 at 9:55 pm to Dr. Fredia Sorrow , who verbally acknowledged these results.   Electronically Signed   By: Marin Olp M.D.   On: 08/06/2014 21:56   Results for orders placed during the hospital encounter of 08/05/14  CBC WITH DIFFERENTIAL      Result Value Ref Range   WBC 6.9  4.0 - 10.5 K/uL   RBC 4.06 (*) 4.22 - 5.81 MIL/uL   Hemoglobin 11.8 (*) 13.0 - 17.0 g/dL   HCT 34.9 (*) 39.0 - 52.0 %   MCV 86.0  78.0 - 100.0 fL  MCH 29.1  26.0 - 34.0 pg   MCHC 33.8  30.0 - 36.0 g/dL   RDW 11.5  11.5 - 15.5 %   Platelets 197  150 - 400 K/uL   Neutrophils Relative % 74  43 - 77 %   Neutro Abs 5.1  1.7 - 7.7 K/uL   Lymphocytes Relative 14  12 - 46 %   Lymphs Abs 1.0  0.7 - 4.0 K/uL   Monocytes Relative 11  3 - 12 %   Monocytes Absolute 0.8  0.1 - 1.0 K/uL   Eosinophils Relative 1  0 - 5 %   Eosinophils Absolute 0.1  0.0 - 0.7 K/uL   Basophils Relative 0  0 - 1 %   Basophils Absolute 0.0  0.0 - 0.1 K/uL  COMPREHENSIVE METABOLIC PANEL      Result Value Ref Range   Sodium 136 (*) 137 - 147 mEq/L   Potassium 3.6 (*) 3.7 - 5.3 mEq/L   Chloride 97  96 - 112 mEq/L   CO2 25  19 - 32 mEq/L    Glucose, Bld 125 (*) 70 - 99 mg/dL   BUN 11  6 - 23 mg/dL   Creatinine, Ser 0.90  0.50 - 1.35 mg/dL   Calcium 9.8  8.4 - 10.5 mg/dL   Total Protein 8.3  6.0 - 8.3 g/dL   Albumin 3.2 (*) 3.5 - 5.2 g/dL   AST 58 (*) 0 - 37 U/L   ALT 39  0 - 53 U/L   Alkaline Phosphatase 61  39 - 117 U/L   Total Bilirubin 1.0  0.3 - 1.2 mg/dL   GFR calc non Af Amer >90  >90 mL/min   GFR calc Af Amer >90  >90 mL/min   Anion gap 14  5 - 15     EKG Interpretation None      MDM   Final diagnoses:  Perineal abscess    CT scan shows a abscess in the perineal area. No direct involvement with scrotum or with the perirectal area. Patient has had symptoms for a few days. Started on antibiotics yesterday. We'll continue them. Based on the location recommend drainage by general surgery. Referral information provided. Patient has a per cautioned to return for any development of any scrotal swelling or any new or worse symptoms.    Fredia Sorrow, MD 08/06/14 [place], MD 08/06/14 [phone]

## 2015-06-02 ENCOUNTER — Encounter (HOSPITAL_BASED_OUTPATIENT_CLINIC_OR_DEPARTMENT_OTHER): Payer: Self-pay | Admitting: *Deleted

## 2015-06-02 ENCOUNTER — Emergency Department (HOSPITAL_BASED_OUTPATIENT_CLINIC_OR_DEPARTMENT_OTHER)
Admission: EM | Admit: 2015-06-02 | Discharge: 2015-06-02 | Disposition: A | Discharge status: Home / Self Care | Payer: Self-pay | Attending: Emergency Medicine | Admitting: Emergency Medicine

## 2015-06-02 DIAGNOSIS — Z792 Long term (current) use of antibiotics: Secondary | ICD-10-CM | POA: Insufficient documentation

## 2015-06-02 DIAGNOSIS — J301 Allergic rhinitis due to pollen: Secondary | ICD-10-CM | POA: Insufficient documentation

## 2015-06-02 DIAGNOSIS — Z9109 Other allergy status, other than to drugs and biological substances: Secondary | ICD-10-CM

## 2015-06-02 DIAGNOSIS — Z79899 Other long term (current) drug therapy: Secondary | ICD-10-CM | POA: Insufficient documentation

## 2015-06-02 DIAGNOSIS — Z7952 Long term (current) use of systemic steroids: Secondary | ICD-10-CM | POA: Insufficient documentation

## 2015-06-02 HISTORY — DX: Chronic sinusitis, unspecified: J32.9

## 2015-06-02 MED ORDER — CETIRIZINE-PSEUDOEPHEDRINE ER 5-120 MG PO TB12: 1.0000 | ORAL_TABLET | Freq: Every day | ORAL | Status: DC

## 2015-06-02 NOTE — ED Provider Notes (Signed)
CSN: [id number]     Arrival date & time 06/02/15  2127 History   First MD Initiated Contact with Patient 06/02/15 2234     Chief Complaint  Patient presents with  . Sinusitis     (Consider location/radiation/quality/duration/timing/severity/associated sxs/prior Treatment) HPI Phillip Hughes is a 59 y.o. male who comes in for evaluation of itchy eyes and nasal congestion. Patient states for the past week he has had watery eyes as well as nasal congestion. He denies fevers, chills,sore throat, facial pain, headache, cough, shortness of breath, runny nose. He has not tried anything to improve the symptoms. He reports being outside in the pollen exacerbates his symptoms. No other aggravating or modifying factors.  Past Medical History  Diagnosis Date  . Sinusitis    Past Surgical History  Procedure Laterality Date  . Hernia repair     No family history on file. History  Substance Use Topics  . Smoking status: Never Smoker   . Smokeless tobacco: Not on file  . Alcohol Use: No    Review of Systems A 10 point review of systems was completed and was negative except for pertinent positives and negatives as mentioned in the history of present illness     Allergies  Review of patient's allergies indicates no known allergies.  Home Medications   Prior to Admission medications   Medication Sig Start Date End Date Taking? Authorizing Provider  cephALEXin (KEFLEX) 500 MG capsule Take 1 capsule (500 mg total) by mouth 4 (four) times daily. 08/05/14   Fransico Meadow, PA-C  cetirizine-pseudoephedrine (ZYRTEC-D) 5-120 MG per tablet Take 1 tablet by mouth daily. 06/02/15   Comer Locket, PA-C  HYDROcodone-acetaminophen (NORCO/VICODIN) 5-325 MG per tablet Take 2 tablets by mouth every 4 (four) hours as needed. 08/05/14   Fransico Meadow, PA-C  hydrOXYzine (ATARAX/VISTARIL) 25 MG tablet Take 1 tablet (25 mg total) by mouth every 6 (six) hours as needed for itching. 03/03/13   Larene Pickett, PA-C   naproxen (NAPROSYN) 500 MG tablet Take 500 mg by mouth 2 (two) times daily as needed. For pain    Historical Provider, MD  naproxen sodium (ANAPROX) 220 MG tablet Take 440 mg by mouth daily as needed. For hip pain    Historical Provider, MD  predniSONE (DELTASONE) 20 MG tablet Take 2 tablets (40 mg total) by mouth daily. 03/03/13   Larene Pickett, PA-C  triamcinolone (KENALOG) 0.025 % ointment Apply topically 2 (two) times daily. Do not apply to face 03/03/13   Larene Pickett, PA-C   BP 131/71 mmHg  Pulse 82  Temp(Src) 97.6 F (36.4 C) (Oral)  Resp 18  Ht 5\' 7"  (1.702 m)  Wt 155 lb (70.308 kg)  BMI 24.27 kg/m2  SpO2 98% Physical Exam  Constitutional:  Awake, alert, nontoxic appearance.  HENT:  Head: Atraumatic.  Eyes: Conjunctivae are normal. Right eye exhibits no discharge. Left eye exhibits no discharge.  Neck: Normal range of motion. Neck supple.  Pulmonary/Chest: Effort normal. He exhibits no tenderness.  Abdominal: Soft. There is no tenderness. There is no rebound.  Musculoskeletal: He exhibits no tenderness.  Baseline ROM, no obvious new focal weakness.  Lymphadenopathy:    He has no cervical adenopathy.  Neurological:  Mental status and motor strength appears baseline for patient and situation.  Skin: No rash noted.  Psychiatric: He has a normal mood and affect.  Nursing note and vitals reviewed.   ED Course  Procedures (including critical care time) Labs Review Labs Reviewed -  No data to display  Imaging Review No results found.   EKG Interpretation None     Meds given in ED:  Medications - No data to display  New Prescriptions   CETIRIZINE-PSEUDOEPHEDRINE (ZYRTEC-D) 5-120 MG PER TABLET    Take 1 tablet by mouth daily.   Filed Vitals:   06/02/15 2135  BP: 131/71  Pulse: 82  Temp: 97.6 F (36.4 C)  TempSrc: Oral  Resp: 18  Height: 5\' 7"  (1.702 m)  Weight: 155 lb (70.308 kg)  SpO2: 98%    MDM  Vitals stable - WNL -afebrile Pt resting  comfortably in ED. Patient with symptoms likely secondary to seasonal allergies. No evidence of acute bacterial infection at this time. DC with anti-histamine's and decongestant. Discussed further evaluation and management by his primary care. I discussed all relevant lab findings and imaging results with pt and they verbalized understanding. Discussed f/u with PCP within 48 hrs and return precautions, pt very amenable to plan.  Final diagnoses:  Pollen allergies        Comer Locket, PA-C 06/02/15 2308  Blanchie Dessert, MD 06/02/15 2356

## 2015-06-02 NOTE — Discharge Instructions (Signed)
Take your medications as prescribed. Follow-up with primary care for further evaluation and management of your symptoms. Return to ED for any worsening symptoms  Allergies Allergies may happen from anything your body is sensitive to. This may be food, medicines, pollens, chemicals, and nearly anything around you in everyday life that produces allergens. An allergen is anything that causes an allergy producing substance. Heredity is often a factor in causing these problems. This means you may have some of the same allergies as your parents. Food allergies happen in all age groups. Food allergies are some of the most severe and life threatening. Some common food allergies are cow's milk, seafood, eggs, nuts, wheat, and soybeans. SYMPTOMS   Swelling around the mouth.  An itchy red rash or hives.  Vomiting or diarrhea.  Difficulty breathing. SEVERE ALLERGIC REACTIONS ARE LIFE-THREATENING. This reaction is called anaphylaxis. It can cause the mouth and throat to swell and cause difficulty with breathing and swallowing. In severe reactions only a trace amount of food (for example, peanut oil in a salad) may cause death within seconds. Seasonal allergies occur in all age groups. These are seasonal because they usually occur during the same season every year. They may be a reaction to molds, grass pollens, or tree pollens. Other causes of problems are house dust mite allergens, pet dander, and mold spores. The symptoms often consist of nasal congestion, a runny itchy nose associated with sneezing, and tearing itchy eyes. There is often an associated itching of the mouth and ears. The problems happen when you come in contact with pollens and other allergens. Allergens are the particles in the air that the body reacts to with an allergic reaction. This causes you to release allergic antibodies. Through a chain of events, these eventually cause you to release histamine into the blood stream. Although it is  meant to be protective to the body, it is this release that causes your discomfort. This is why you were given anti-histamines to feel better. If you are unable to pinpoint the offending allergen, it may be determined by skin or blood testing. Allergies cannot be cured but can be controlled with medicine. Hay fever is a collection of all or some of the seasonal allergy problems. It may often be treated with simple over-the-counter medicine such as diphenhydramine. Take medicine as directed. Do not drink alcohol or drive while taking this medicine. Check with your caregiver or package insert for child dosages. If these medicines are not effective, there are many new medicines your caregiver can prescribe. Stronger medicine such as nasal spray, eye drops, and corticosteroids may be used if the first things you try do not work well. Other treatments such as immunotherapy or desensitizing injections can be used if all else fails. Follow up with your caregiver if problems continue. These seasonal allergies are usually not life threatening. They are generally more of a nuisance that can often be handled using medicine. HOME CARE INSTRUCTIONS   If unsure what causes a reaction, keep a diary of foods eaten and symptoms that follow. Avoid foods that cause reactions.  If hives or rash are present:  Take medicine as directed.  You may use an over-the-counter antihistamine (diphenhydramine) for hives and itching as needed.  Apply cold compresses (cloths) to the skin or take baths in cool water. Avoid hot baths or showers. Heat will make a rash and itching worse.  If you are severely allergic:  Following a treatment for a severe reaction, hospitalization is often required for  closer follow-up.  Wear a medic-alert bracelet or necklace stating the allergy.  You and your family must learn how to give adrenaline or use an anaphylaxis kit.  If you have had a severe reaction, always carry your anaphylaxis kit  or EpiPen with you. Use this medicine as directed by your caregiver if a severe reaction is occurring. Failure to do so could have a fatal outcome. SEEK MEDICAL CARE IF:  You suspect a food allergy. Symptoms generally happen within 30 minutes of eating a food.  Your symptoms have not gone away within 2 days or are getting worse.  You develop new symptoms.  You want to retest yourself or your child with a food or drink you think causes an allergic reaction. Never do this if an anaphylactic reaction to that food or drink has happened before. Only do this under the care of a caregiver. SEEK IMMEDIATE MEDICAL CARE IF:   You have difficulty breathing, are wheezing, or have a tight feeling in your chest or throat.  You have a swollen mouth, or you have hives, swelling, or itching all over your body.  You have had a severe reaction that has responded to your anaphylaxis kit or an EpiPen. These reactions may return when the medicine has worn off. These reactions should be considered life threatening. MAKE SURE YOU:   Understand these instructions.  Will watch your condition.  Will get help right away if you are not doing well or get worse. Document Released: 02/22/2003 Document Revised: 03/26/2013 Document Reviewed: 07/29/2008 [facility] Patient Information 2015 [place], [place]. This information is not intended to replace advice given to you by your health care provider. Make sure you discuss any questions you have with your health care provider.

## 2015-06-02 NOTE — ED Notes (Signed)
Eyes swollen. States he has sinusitis.

## 2017-02-27 ENCOUNTER — Encounter (HOSPITAL_BASED_OUTPATIENT_CLINIC_OR_DEPARTMENT_OTHER): Payer: Self-pay | Admitting: Emergency Medicine

## 2017-02-27 ENCOUNTER — Emergency Department (HOSPITAL_BASED_OUTPATIENT_CLINIC_OR_DEPARTMENT_OTHER)
Admission: EM | Admit: 2017-02-27 | Discharge: 2017-02-27 | Disposition: A | Discharge status: Home / Self Care | Payer: Medicare Other | Attending: Emergency Medicine | Admitting: Emergency Medicine

## 2017-02-27 DIAGNOSIS — L509 Urticaria, unspecified: Secondary | ICD-10-CM | POA: Diagnosis not present

## 2017-02-27 DIAGNOSIS — R21 Rash and other nonspecific skin eruption: Secondary | ICD-10-CM | POA: Diagnosis present

## 2017-02-27 MED ORDER — EPINEPHRINE 0.3 MG/0.3ML IJ SOAJ: 0.3000 mg | Freq: Once | INTRAMUSCULAR | 1 refills | Status: AC

## 2017-02-27 MED ORDER — PREDNISONE 10 MG (21) PO TBPK: ORAL_TABLET | ORAL | 0 refills | Status: DC

## 2017-02-27 MED ORDER — PREDNISONE 50 MG PO TABS
60.0000 mg | ORAL_TABLET | Freq: Once | ORAL | Status: AC
  Administered 2017-02-27: 60 mg via ORAL
  Filled 2017-02-27: qty 1

## 2017-02-27 NOTE — Discharge Instructions (Signed)
Carry the epipen with you in case you develop a severe allergic reaction as we discussed, consider seeing an allergist, like Dr Donneta Romberg for allergy testing

## 2017-02-27 NOTE — ED Provider Notes (Signed)
[facility] Provider Note   CSN: [id number] Arrival date & time: 02/27/17  4627  By signing my name below, I, Phillip Hughes, attest that this documentation has been prepared under the direction and in the presence of physician practitioner, Dorie Rank, MD. Electronically Signed: Dora Hughes, Scribe. 02/27/2017. 12:51 AM.  History   Chief Complaint Chief Complaint  Patient presents with  . Rash    The history is provided by the patient. No language interpreter was used.    HPI Comments: Phillip Hughes is a 61 y.o. male who presents to the Emergency Department complaining of a generalized rash to his extremities that began about an hour ago. He states he ate an [employer] from [employer] for the first time shortly prior to onset of his rash. No h/o the same. He took Benadryl PTA with some improvement of the rash. Pt denies SOB, wheezing, or any other associated symptoms.  Past Medical History:  Diagnosis Date  . Sinusitis     There are no active problems to display for this patient.   Past Surgical History:  Procedure Laterality Date  . HERNIA REPAIR         Home Medications    Prior to Admission medications   Medication Sig Start Date End Date Taking? Authorizing Provider  cephALEXin (KEFLEX) 500 MG capsule Take 1 capsule (500 mg total) by mouth 4 (four) times daily. 08/05/14   Fransico Meadow, PA-C  cetirizine-pseudoephedrine (ZYRTEC-D) 5-120 MG per tablet Take 1 tablet by mouth daily. 06/02/15   Comer Locket, PA-C  EPINEPHrine 0.3 mg/0.3 mL IJ SOAJ injection Inject 0.3 mLs (0.3 mg total) into the muscle once. For severe allergic reaction 02/27/17 02/27/17  Dorie Rank, MD  HYDROcodone-acetaminophen (NORCO/VICODIN) 5-325 MG per tablet Take 2 tablets by mouth every 4 (four) hours as needed. 08/05/14   Fransico Meadow, PA-C  hydrOXYzine (ATARAX/VISTARIL) 25 MG tablet Take 1 tablet (25 mg total) by mouth every 6 (six) hours as needed for itching. 03/03/13    Larene Pickett, PA-C  naproxen (NAPROSYN) 500 MG tablet Take 500 mg by mouth 2 (two) times daily as needed. For pain    Historical Provider, MD  naproxen sodium (ANAPROX) 220 MG tablet Take 440 mg by mouth daily as needed. For hip pain    Historical Provider, MD  predniSONE (STERAPRED UNI-PAK 21 TAB) 10 MG (21) TBPK tablet Take 6 tabs by mouth daily  for 1 days, then 5 tabs for 1 days, then 4 tabs for 1 days, then 3 tabs for 1 days, 2 tabs for 1 days, then 1 tab by mouth daily for 1 days 02/27/17   Dorie Rank, MD  triamcinolone (KENALOG) 0.025 % ointment Apply topically 2 (two) times daily. Do not apply to face 03/03/13   Larene Pickett, PA-C    Family History No family history on file.  Social History Social History  Substance Use Topics  . Smoking status: Never Smoker  . Smokeless tobacco: Not on file  . Alcohol use No     Allergies   Patient has no known allergies.   Review of Systems Review of Systems  Respiratory: Negative for shortness of breath and wheezing.   Skin: Positive for rash.  All other systems reviewed and are negative.   Physical Exam Updated Vital Signs BP 140/72 (BP Location: Left Arm)   Pulse 93   Temp 97.6 F (36.4 C) (Oral)   Resp 18   Ht 5\' 7"  (1.702 m)  Wt 74.8 kg   SpO2 100%   BMI 25.84 kg/m   Physical Exam  Constitutional: He appears well-developed and well-nourished. No distress.  HENT:  Head: Normocephalic and atraumatic.  Mouth/Throat: Oropharynx is clear and moist. No uvula swelling.  Eyes: Conjunctivae are normal. Right eye exhibits no discharge. Left eye exhibits no discharge. No scleral icterus.  Neck: Neck supple. No tracheal deviation present.  Cardiovascular: Normal rate, regular rhythm and intact distal pulses.   Pulmonary/Chest: Effort normal and breath sounds normal. No stridor. No respiratory distress. He has no wheezes. He has no rales.  Abdominal: Soft. Bowel sounds are normal. He exhibits no distension. There is no  tenderness. There is no rebound and no guarding.  Musculoskeletal: He exhibits no edema or tenderness.  Neurological: He is alert. He has normal strength. No cranial nerve deficit (no facial droop, extraocular movements intact, no slurred speech) or sensory deficit. He exhibits normal muscle tone. He displays no seizure activity. Coordination normal.  Skin: Skin is warm and dry. Rash noted. Rash is urticarial.  Psychiatric: He has a normal mood and affect.  Nursing note and vitals reviewed.   ED Treatments / Results     Procedures Procedures (including critical care time)  DIAGNOSTIC STUDIES: Oxygen Saturation is 100% on RA, normal by my interpretation.    COORDINATION OF CARE: 12:56 AM Discussed treatment plan with pt at bedside and pt agreed to plan.  Medications Ordered in ED Medications  predniSONE (DELTASONE) tablet 60 mg (not administered)     Initial Impression / Assessment and Plan / ED Course  I have reviewed the triage vital signs and the nursing notes.  Pertinent labs & imaging results that were available during my care of the patient were reviewed by me and considered in my medical decision making (see chart for details).   patient has an urticarial rash. He has no other systemic symptoms.  It's unclear what triggered this reaction although it's possible he could be related to something he ate. I will discharge him home with an EpiPen. Continue oral steroids. He will continue the antihistamines.  Outpatient referral to an allergist  Final Clinical Impressions(s) / ED Diagnoses   Final diagnoses:  Urticaria    New Prescriptions New Prescriptions   EPINEPHRINE 0.3 MG/0.3 ML IJ SOAJ INJECTION    Inject 0.3 mLs (0.3 mg total) into the muscle once. For severe allergic reaction   PREDNISONE (STERAPRED UNI-PAK 21 TAB) 10 MG (21) TBPK TABLET    Take 6 tabs by mouth daily  for 1 days, then 5 tabs for 1 days, then 4 tabs for 1 days, then 3 tabs for 1 days, 2 tabs for 1  days, then 1 tab by mouth daily for 1 days   I personally performed the services described in this documentation, which was scribed in my presence.  The recorded information has been reviewed and is accurate.    Dorie Rank, MD 02/27/17 [phone]

## 2017-02-27 NOTE — ED Triage Notes (Signed)
PT parents to ED with complaints of rash on arms legs and trunk that started tonight after eating an Enoch from [employer]. Pt took benadryl at midnight.

## 2018-02-26 ENCOUNTER — Encounter (HOSPITAL_BASED_OUTPATIENT_CLINIC_OR_DEPARTMENT_OTHER): Payer: Self-pay | Admitting: *Deleted

## 2018-02-26 ENCOUNTER — Other Ambulatory Visit: Payer: Self-pay

## 2018-02-26 ENCOUNTER — Emergency Department (HOSPITAL_BASED_OUTPATIENT_CLINIC_OR_DEPARTMENT_OTHER): Payer: Medicare Other

## 2018-02-26 ENCOUNTER — Emergency Department (HOSPITAL_BASED_OUTPATIENT_CLINIC_OR_DEPARTMENT_OTHER)
Admission: EM | Admit: 2018-02-26 | Discharge: 2018-02-26 | Disposition: A | Discharge status: Home / Self Care | Payer: Medicare Other | Attending: Emergency Medicine | Admitting: Emergency Medicine

## 2018-02-26 DIAGNOSIS — J069 Acute upper respiratory infection, unspecified: Secondary | ICD-10-CM

## 2018-02-26 DIAGNOSIS — B9789 Other viral agents as the cause of diseases classified elsewhere: Secondary | ICD-10-CM

## 2018-02-26 MED ORDER — BENZONATATE 100 MG PO CAPS: 100.0000 mg | ORAL_CAPSULE | Freq: Three times a day (TID) | ORAL | 0 refills | Status: DC

## 2018-02-26 MED ORDER — FLUTICASONE PROPIONATE 50 MCG/ACT NA SUSP: 1.0000 | Freq: Every day | NASAL | 2 refills | Status: DC

## 2018-02-26 MED ORDER — IBUPROFEN 400 MG PO TABS
600.0000 mg | ORAL_TABLET | Freq: Once | ORAL | Status: AC
  Administered 2018-02-26: 11:00:00 600 mg via ORAL
  Filled 2018-02-26: qty 1

## 2018-02-26 NOTE — ED Provider Notes (Signed)
[facility] EMERGENCY DEPARTMENT Provider Note   CSN: [id number] Arrival date & time: 02/26/18  1046     History   Chief Complaint Chief Complaint  Patient presents with  . Generalized Body Aches    HPI Phillip Hughes is a 62 y.o. male.  HPI  Patient with no significant medical history presents with complaint of body aches nasal congestion cough and headache.  Symptoms began 3 days ago.  He has not had any fever.  No vomiting or abdominal pain.  No difficulty breathing.  He tried his daughter's Flonase this morning which seemed to help his nose.  He has 2 family members that have viral respiratory infections at this time.  No recent travel.  No chest pain.  No sore throat or difficulty swallowing.  There are no other associated systemic symptoms, there are no other alleviating or modifying factors.   Past Medical History:  Diagnosis Date  . Sinusitis     There are no active problems to display for this patient.   Past Surgical History:  Procedure Laterality Date  . HERNIA REPAIR         Home Medications    Prior to Admission medications   Medication Sig Start Date End Date Taking? Authorizing Provider  benzonatate (TESSALON) 100 MG capsule Take 1 capsule (100 mg total) by mouth every 8 (eight) hours. 02/26/18   Mabe, Forbes Cellar, MD  fluticasone (FLONASE) 50 MCG/ACT nasal spray Place 1 spray into both nostrils daily. 02/26/18   Mabe, Forbes Cellar, MD    Family History History reviewed. No pertinent family history.  Social History Social History   Tobacco Use  . Smoking status: Never Smoker  Substance Use Topics  . Alcohol use: No  . Drug use: No     Allergies   Patient has no known allergies.   Review of Systems Review of Systems  ROS reviewed and all otherwise negative except for mentioned in HPI   Physical Exam Updated Vital Signs BP (!) 154/91 (BP Location: Right Arm)   Pulse 85   Temp (!) 97.5 F (36.4 C) (Oral)   Resp 18   Ht 5\' 7"   (1.702 m)   Wt 72.6 kg (160 lb)   SpO2 99%   BMI 25.06 kg/m  Vitals reviewed Physical Exam  Physical Examination: General appearance - alert, well appearing, and in no distress Mental status - alert, oriented to person, place, and time Eyes - no conjunctival injection, no scleral icterus Mouth - mucous membranes moist, pharynx normal without lesions Neck - supple, no significant adenopathy Chest - clear to auscultation, no wheezes, rales or rhonchi, symmetric air entry Heart - normal rate, regular rhythm, normal S1, S2, no murmurs, rubs, clicks or gallops Abdomen - soft, nontender, nondistended, no masses or organomegaly Extremities - peripheral pulses normal, no swelling Skin - normal coloration and turgor, no rashes   ED Treatments / Results  Labs (all labs ordered are listed, but only abnormal results are displayed) Labs Reviewed - No data to display  EKG  EKG Interpretation None       Radiology Dg Chest 2 View  Result Date: 02/26/2018 CLINICAL DATA:  Cough. EXAM: CHEST - 2 VIEW COMPARISON:  January 13, 2016 FINDINGS: The left costophrenic angle was excluded from the image. The heart, hila, mediastinum, visualized portions of the lungs, and pleura are normal. No cause for cough identified. IMPRESSION: No active cardiopulmonary disease. Electronically Signed   By: Dorise Bullion III M.D   On:  02/26/2018 11:46    Procedures Procedures (including critical care time)  Medications Ordered in ED Medications  ibuprofen (ADVIL,MOTRIN) tablet 600 mg (600 mg Oral Given 02/26/18 1117)     Initial Impression / Assessment and Plan / ED Course  I have reviewed the triage vital signs and the nursing notes.  Pertinent labs & imaging results that were available during my care of the patient were reviewed by me and considered in my medical decision making (see chart for details).     Patient presenting with complaint of cough nasal congestion and sore throat.  Symptoms have been  ongoing for the past several days.  He has sick contacts with similar viral symptoms.  His lungs are clear and he has normal work of breathing.  His oropharynx is clear.  He is overall nontoxic in appearance.  Chest x-ray reassuring.  He states Flonase has helped him with congestion so given prescription for Flonase and Tessalon Perles for cough.  Discharged with strict return precautions.  Pt agreeable with plan.  Final Clinical Impressions(s) / ED Diagnoses   Final diagnoses:  Viral URI with cough    ED Discharge Orders        Ordered    fluticasone (FLONASE) 50 MCG/ACT nasal spray  Daily     02/26/18 1159    benzonatate (TESSALON) 100 MG capsule  Every 8 hours     02/26/18 1159       Mabe, Forbes Cellar, MD 02/26/18 1328

## 2018-02-26 NOTE — Discharge Instructions (Signed)
Return to the ED with any concerns including difficulty breathing, vomiting and not able to keep down liquids, decreased urine output, decreased level of alertness/lethargy, or any other alarming symptoms  °

## 2018-02-26 NOTE — ED Triage Notes (Signed)
Cough, body aches, HA x 2 days. Ambulatory.

## 2019-06-06 ENCOUNTER — Other Ambulatory Visit: Payer: Self-pay

## 2019-06-06 ENCOUNTER — Encounter (HOSPITAL_BASED_OUTPATIENT_CLINIC_OR_DEPARTMENT_OTHER): Payer: Self-pay

## 2019-06-06 ENCOUNTER — Emergency Department (HOSPITAL_BASED_OUTPATIENT_CLINIC_OR_DEPARTMENT_OTHER): Payer: Medicare Other

## 2019-06-06 ENCOUNTER — Emergency Department (HOSPITAL_BASED_OUTPATIENT_CLINIC_OR_DEPARTMENT_OTHER)
Admission: EM | Admit: 2019-06-06 | Discharge: 2019-06-06 | Disposition: A | Discharge status: Home / Self Care | Payer: Medicare Other | Attending: Emergency Medicine | Admitting: Emergency Medicine

## 2019-06-06 DIAGNOSIS — Z79899 Other long term (current) drug therapy: Secondary | ICD-10-CM | POA: Insufficient documentation

## 2019-06-06 DIAGNOSIS — Z87891 Personal history of nicotine dependence: Secondary | ICD-10-CM | POA: Insufficient documentation

## 2019-06-06 DIAGNOSIS — M5431 Sciatica, right side: Secondary | ICD-10-CM | POA: Insufficient documentation

## 2019-06-06 DIAGNOSIS — Z96641 Presence of right artificial hip joint: Secondary | ICD-10-CM | POA: Insufficient documentation

## 2019-06-06 MED ORDER — KETOROLAC TROMETHAMINE 60 MG/2ML IM SOLN
60.0000 mg | Freq: Once | INTRAMUSCULAR | Status: AC
  Administered 2019-06-06: 18:00:00 60 mg via INTRAMUSCULAR
  Filled 2019-06-06: qty 2

## 2019-06-06 MED ORDER — CYCLOBENZAPRINE HCL 10 MG PO TABS: 10.0000 mg | ORAL_TABLET | Freq: Two times a day (BID) | ORAL | 0 refills | Status: DC | PRN

## 2019-06-06 NOTE — ED Notes (Signed)
ED Provider at bedside. 

## 2019-06-06 NOTE — ED Notes (Signed)
Patient transported to X-ray 

## 2019-06-06 NOTE — Discharge Instructions (Signed)
Take 1000 mg of Tylenol 4 times a day, 600 mg of Motrin 3 times a day over the next several days.  Take Flexeril as needed for muscle relaxant.  Do not mix with alcohol or drugs.

## 2019-06-06 NOTE — ED Triage Notes (Signed)
Pt c/o sudden onset pain to right buttock area down right leg x 3 hours-denies injury-NAD-to triage in w/c

## 2019-06-06 NOTE — ED Provider Notes (Addendum)
[facility] Provider Note   CSN: [id number] Arrival date & time: 06/06/19  1743    History   Chief Complaint Chief Complaint  Patient presents with  . Leg Pain    HPI Phillip Hughes is a 63 y.o. male.     The history is provided by the patient.  Leg Pain Location:  Buttock Injury: no   Buttock location:  R buttock Pain details:    Quality:  Cramping and aching   Radiates to:  Does not radiate   Severity:  Mild   Onset quality:  Sudden   Timing:  Constant   Progression:  Unchanged Relieved by:  Nothing Worsened by:  Bearing weight Associated symptoms: stiffness   Associated symptoms: no back pain, no decreased ROM, no fatigue, no fever, no itching, no muscle weakness, no swelling and no tingling     Past Medical History:  Diagnosis Date  . Sinusitis     There are no active problems to display for this patient.   Past Surgical History:  Procedure Laterality Date  . BACK SURGERY    . HERNIA REPAIR    . HIP SURGERY    . NECK SURGERY          Home Medications    Prior to Admission medications   Medication Sig Start Date End Date Taking? Authorizing Provider  benzonatate (TESSALON) 100 MG capsule Take 1 capsule (100 mg total) by mouth every 8 (eight) hours. 02/26/18   Mabe, Forbes Cellar, MD  cyclobenzaprine (FLEXERIL) 10 MG tablet Take 1 tablet (10 mg total) by mouth 2 (two) times daily as needed for up to 10 doses for muscle spasms. 06/06/19   Ammiel Guiney, DO  fluticasone (FLONASE) 50 MCG/ACT nasal spray Place 1 spray into both nostrils daily. 02/26/18   Mabe, Forbes Cellar, MD    Family History No family history on file.  Social History Social History   Tobacco Use  . Smoking status: Former Research scientist (life sciences)  . Smokeless tobacco: Never Used  Substance Use Topics  . Alcohol use: No  . Drug use: No     Allergies   Patient has no known allergies.   Review of Systems Review of Systems  Constitutional: Negative for chills,  fatigue and fever.  HENT: Negative for ear pain and sore throat.   Eyes: Negative for pain and visual disturbance.  Respiratory: Negative for cough and shortness of breath.   Cardiovascular: Negative for chest pain and palpitations.  Gastrointestinal: Negative for abdominal pain and vomiting.  Genitourinary: Negative for dysuria and hematuria.  Musculoskeletal: Positive for gait problem and stiffness. Negative for arthralgias and back pain.  Skin: Negative for color change, itching and rash.  Neurological: Negative for seizures and syncope.  All other systems reviewed and are negative.    Physical Exam Updated Vital Signs BP 136/79 (BP Location: Right Arm)   Pulse 98   Temp 98.6 F (37 C) (Oral)   Resp 20   Ht 5\' 7"  (1.702 m)   Wt 74.8 kg   SpO2 100%   BMI 25.84 kg/m   Physical Exam Vitals signs and nursing note reviewed.  Constitutional:      General: He is not in acute distress.    Appearance: He is well-developed. He is not ill-appearing.  HENT:     Head: Normocephalic and atraumatic.     Nose: Nose normal.  Eyes:     Extraocular Movements: Extraocular movements intact.     Conjunctiva/sclera: Conjunctivae normal.  Pupils: Pupils are equal, round, and reactive to light.  Neck:     Musculoskeletal: Neck supple.  Cardiovascular:     Rate and Rhythm: Normal rate and regular rhythm.     Pulses: Normal pulses.     Heart sounds: Normal heart sounds. No murmur.  Pulmonary:     Effort: Pulmonary effort is normal. No respiratory distress.     Breath sounds: Normal breath sounds.  Abdominal:     Palpations: Abdomen is soft.     Tenderness: There is no abdominal tenderness.  Musculoskeletal: Normal range of motion.        General: Tenderness (TTP in right gluteal, hip area) present.     Comments: No midline spinal tenderness  Skin:    General: Skin is warm and dry.  Neurological:     General: No focal deficit present.     Mental Status: He is alert.     Sensory:  No sensory deficit.     Motor: No weakness.     Comments: 5+ out of 5 strength, normal sensation in the lower extremities      ED Treatments / Results  Labs (all labs ordered are listed, but only abnormal results are displayed) Labs Reviewed - No data to display  EKG None  Radiology Dg Hip Unilat With Pelvis 2-3 Views Right  Result Date: 06/06/2019 CLINICAL DATA:  63 year old male with history of right hip replacement presenting with sharp pain in the right buttock. EXAM: DG HIP (WITH OR WITHOUT PELVIS) 2-3V RIGHT COMPARISON:  None. 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 The arthroplasty components appear intact and in anatomic alignment. No evidence of hardware loosening. A small crescentic bony density inferior to the head of the femoral component is indeterminate, but possibly chronic. Clinical correlation is recommended. No other acute fracture identified. There is no dislocation. Mild arthritic changes of the left hip. The soft tissues are unremarkable. IMPRESSION: 1. Total right hip arthroplasty appears intact and in anatomic alignment. 2. Small crescentic bony density inferior to the head of the femoral component, indeterminate, but possibly chronic. Clinical correlation is recommended. Direct comparison with prior images, if available, may provide better evaluation. Electronically Signed   By: Anner Crete M.D.   On: 06/06/2019 19:42    Procedures Procedures (including critical care time)  Medications Ordered in ED Medications  ketorolac (TORADOL) injection 60 mg (60 mg Intramuscular Given 06/06/19 1814)     Initial Impression / Assessment and Plan / ED Course  I have reviewed the triage vital signs and the nursing notes.  Pertinent labs & imaging results that were available during my care of the patient were reviewed by me and considered in my medical decision making (see chart for details).        Phillip Hughes is a 63 year old male who presents to the  ED with right hip pain.  Patient with normal vitals.  No fever.  Patient with history of right hip replacement.  Denies any specific trauma.  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  No back pain.  Has been able to ambulate but his legs feel stiff.  Overall appears to have normal neurological exam.  Suspect muscle spasm.  Given his history of hip replacement we will get an x-ray to rule out hip injury.  X-ray showed no acute fracture or dislocation.  There is no obvious point tenderness at this  area but radiology says may be a small bone density.  Likely chronic.  Recommend follow-up with PCP, orthopedic if continues to have pain.  Patient given Toradol shot with some improvement.  Recommend Motrin, Tylenol.  Given Flexeril for muscle relaxant.  Written light duty for work and discharged from ED in good condition.  This chart was dictated using voice recognition software.  Despite best efforts to proofread,  errors can occur which can change the documentation meaning.   Final Clinical Impressions(s) / ED Diagnoses   Final diagnoses:  Sciatica of right side    ED Discharge Orders         Ordered    cyclobenzaprine (FLEXERIL) 10 MG tablet  2 times daily PRN     06/06/19 1826           Lennice Sites, DO 06/06/19 Napoleon, Biddle, DO 06/06/19 1950

## 2021-02-15 ENCOUNTER — Encounter (HOSPITAL_BASED_OUTPATIENT_CLINIC_OR_DEPARTMENT_OTHER): Payer: Self-pay | Admitting: Emergency Medicine

## 2021-02-15 ENCOUNTER — Emergency Department (HOSPITAL_BASED_OUTPATIENT_CLINIC_OR_DEPARTMENT_OTHER)
Admission: EM | Admit: 2021-02-15 | Discharge: 2021-02-16 | Disposition: A | Discharge status: Home / Self Care | Payer: Medicare HMO | Attending: Emergency Medicine | Admitting: Emergency Medicine

## 2021-02-15 ENCOUNTER — Other Ambulatory Visit: Payer: Self-pay

## 2021-02-15 DIAGNOSIS — Z87891 Personal history of nicotine dependence: Secondary | ICD-10-CM | POA: Insufficient documentation

## 2021-02-15 DIAGNOSIS — Z9889 Other specified postprocedural states: Secondary | ICD-10-CM | POA: Diagnosis not present

## 2021-02-15 DIAGNOSIS — M5441 Lumbago with sciatica, right side: Secondary | ICD-10-CM | POA: Diagnosis not present

## 2021-02-15 DIAGNOSIS — Z96643 Presence of artificial hip joint, bilateral: Secondary | ICD-10-CM | POA: Insufficient documentation

## 2021-02-15 DIAGNOSIS — M25551 Pain in right hip: Secondary | ICD-10-CM | POA: Diagnosis present

## 2021-02-15 DIAGNOSIS — M5431 Sciatica, right side: Secondary | ICD-10-CM

## 2021-02-15 MED ORDER — METHOCARBAMOL 500 MG PO TABS
500.0000 mg | ORAL_TABLET | Freq: Once | ORAL | Status: AC
  Administered 2021-02-15: 500 mg via ORAL
  Filled 2021-02-15: qty 1

## 2021-02-15 MED ORDER — OXYCODONE-ACETAMINOPHEN 5-325 MG PO TABS
1.0000 | ORAL_TABLET | Freq: Once | ORAL | Status: AC
  Administered 2021-02-15: 1 via ORAL
  Filled 2021-02-15: qty 1

## 2021-02-15 MED ORDER — KETOROLAC TROMETHAMINE 15 MG/ML IJ SOLN
15.0000 mg | Freq: Once | INTRAMUSCULAR | Status: AC
  Administered 2021-02-15: 15 mg via INTRAMUSCULAR
  Filled 2021-02-15: qty 1

## 2021-02-15 NOTE — ED Notes (Signed)
ED Provider at bedside. 

## 2021-02-15 NOTE — ED Provider Notes (Signed)
[facility]PART[facility] Provider Note   CSN: [id number] Arrival date & time: 02/15/21  1942     History Chief Complaint  Patient presents with  . Hip Pain    Phillip Hughes is a 65 y.o. male presenting for evaluation of leg pain.  Patient states for the past 5 days, he has had gradually worsening right leg pain.  It begins in his buttock and radiates down his entire leg.  It is severe, not to the point where he is having difficulty walking.  He denies fall, trauma, or injury.  He had similar symptoms a few years ago.  He has a history of a hip replacement on the side from 2016.  He denies fevers, chills, nausea, vomiting, abdominal pain, urinary symptoms, numbness or tingling.  He has not taken anything for his symptoms.  No pain on the left side.  Additional history obtained from chart review.  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 Seen in 2020 for sciatica    HPI     Past Medical History:  Diagnosis Date  . Sinusitis     There are no problems to display for this patient.   Past Surgical History:  Procedure Laterality Date  . BACK SURGERY    . HERNIA REPAIR    . HIP SURGERY    . NECK SURGERY         No family history on file.  Social History   Tobacco Use  . Smoking status: Former Research scientist (life sciences)  . Smokeless tobacco: Never Used  Vaping Use  . Vaping Use: Never used  Substance Use Topics  . Alcohol use: No  . Drug use: No    Home Medications Prior to Admission medications   Medication Sig Start Date End Date Taking? Authorizing Provider  HYDROcodone-acetaminophen (NORCO/VICODIN) 5-325 MG tablet Take 1 tablet by mouth every 6 (six) hours as needed for severe pain. 02/16/21  Yes Nakyiah Kuck, PA-C  methocarbamol (ROBAXIN) 500 MG tablet Take 1 tablet (500 mg total) by mouth 2 (two) times daily as needed for muscle spasms. 02/16/21  Yes Abaigeal Moomaw, PA-C  predniSONE (STERAPRED UNI-PAK 21 TAB) 10 MG  (21) TBPK tablet Take by mouth daily. Take 6 tabs PO qd x 2 days, 5 tabs x2 days, 4 tabs x2 days, 3 tabs x2 days, 2 tabs x2 days, 1 tab x 2 days 02/16/21  Yes Iviona Hole, PA-C  benzonatate (TESSALON) 100 MG capsule Take 1 capsule (100 mg total) by mouth every 8 (eight) hours. 02/26/18   Mabe, Forbes Cellar, MD  cyclobenzaprine (FLEXERIL) 10 MG tablet Take 1 tablet (10 mg total) by mouth 2 (two) times daily as needed for up to 10 doses for muscle spasms. 06/06/19   Curatolo, Adam, DO  fluticasone (FLONASE) 50 MCG/ACT nasal spray Place 1 spray into both nostrils daily. 02/26/18   Mabe, Forbes Cellar, MD    Allergies    Patient has no known allergies.  Review of Systems   Review of Systems  Musculoskeletal: Positive for arthralgias (R leg pain) and back pain.  All other systems reviewed and are negative.   Physical Exam Updated Vital Signs BP 132/77 (BP Location: Right Arm)   Pulse 84   Temp 98.1 F (36.7 C) (Oral)   Resp 16   Ht 5\' 7"  (1.702 m)   Wt 74.8 kg   SpO2 98%   BMI 25.84 kg/m   Physical Exam Vitals and nursing note reviewed.  Constitutional:  General: He is not in acute distress.    Appearance: He is well-developed and well-nourished.     Comments: Appears uncomfortable due to pain. In NAD  HENT:     Head: Normocephalic and atraumatic.  Eyes:     Extraocular Movements: Extraocular movements intact and EOM normal.     Conjunctiva/sclera: Conjunctivae normal.     Pupils: Pupils are equal, round, and reactive to light.  Cardiovascular:     Rate and Rhythm: Normal rate and regular rhythm.     Pulses: Normal pulses and intact distal pulses.  Pulmonary:     Effort: Pulmonary effort is normal. No respiratory distress.     Breath sounds: Normal breath sounds. No wheezing.  Abdominal:     General: There is no distension.     Palpations: Abdomen is soft. There is no mass.     Tenderness: There is no abdominal tenderness. There is no guarding or rebound.  Musculoskeletal:         General: Tenderness present.     Cervical back: Normal range of motion and neck supple.     Comments: TTP of R buttock. + SLR on R. No ttp of back or midline spine. No step offs. Pedal pulses 2+ bilaterally. No saddle anesthesia  Skin:    General: Skin is warm and dry.     Capillary Refill: Capillary refill takes less than 2 seconds.  Neurological:     Mental Status: He is alert and oriented to person, place, and time.  Psychiatric:        Mood and Affect: Mood and affect normal.     ED Results / Procedures / Treatments   Labs (all labs ordered are listed, but only abnormal results are displayed) Labs Reviewed - No data to display  EKG None  Radiology No results found.  Procedures Procedures   Medications Ordered in ED Medications  ketorolac (TORADOL) 15 MG/ML injection 15 mg (15 mg Intramuscular Given 02/15/21 2156)  methocarbamol (ROBAXIN) tablet 500 mg (500 mg Oral Given 02/15/21 2155)  oxyCODONE-acetaminophen (PERCOCET/ROXICET) 5-325 MG per tablet 1 tablet (1 tablet Oral Given 02/15/21 2307)    ED Course  I have reviewed the triage vital signs and the nursing notes.  Pertinent labs & imaging results that were available during my care of the patient were reviewed by me and considered in my medical decision making (see chart for details).    MDM Rules/Calculators/A&P                          Patient presenting for evaluation of right leg pain.  On exam, pain is reproducible with palpation over the buttock with a positive straight leg raise.  Radiates down his leg, likely sciatica.  History of previous sciatica, as well as lumbar and hip problems.  As he has not had trauma or injury, will not obtain x-rays.  Will treat symptomatically and reassess.  On reassessment after Toradol and Robaxin, patient reports significant improvement of symptoms.  However when he went to try to stand up, he was unable to do so due to pain.  Will give further pain control and reassess.  On  further evaluation, patient reports pain is improved.  He is able to ambulate, although still has a limp due to pain.  Discussed with patient continued pain control, use of prednisone, and importance of follow-up with Ortho and/or neurosurgery for further management.  At this time, patient appears safe for discharge. Return  precautions given.  Patient states he understands and agrees to plan.   Final Clinical Impression(s) / ED Diagnoses Final diagnoses:  Sciatica of right side    Rx / DC Orders ED Discharge Orders         Ordered    predniSONE (STERAPRED UNI-PAK 21 TAB) 10 MG (21) TBPK tablet  Daily        02/16/21 0005    methocarbamol (ROBAXIN) 500 MG tablet  2 times daily PRN        02/16/21 0005    HYDROcodone-acetaminophen (NORCO/VICODIN) 5-325 MG tablet  Every 6 hours PRN        02/16/21 0005           Suman Trivedi, PA-C 02/16/21 0008    Drenda Freeze, MD 02/18/21 1258

## 2021-02-15 NOTE — ED Triage Notes (Signed)
Reports right hip pain for the last few days.  Hx of the same with hip replacement.  Reports its hard to bear weight.

## 2021-02-16 MED ORDER — METHOCARBAMOL 500 MG PO TABS: 500.0000 mg | ORAL_TABLET | Freq: Two times a day (BID) | ORAL | 0 refills | Status: DC | PRN

## 2021-02-16 MED ORDER — PREDNISONE 10 MG (21) PO TBPK: ORAL_TABLET | Freq: Every day | ORAL | 0 refills | Status: DC

## 2021-02-16 MED ORDER — HYDROCODONE-ACETAMINOPHEN 5-325 MG PO TABS: 1.0000 | ORAL_TABLET | Freq: Four times a day (QID) | ORAL | 0 refills | Status: DC | PRN

## 2021-02-16 NOTE — Discharge Instructions (Signed)
Take prednisone as prescribed for pain and swelling.  Do not take other anti-inflammatories at the same time open (Advil, Motrin, ibuprofen, Aleve). You may supplement with Tylenol if you need further pain control. Use the muscle relaxer (robaxin) as needed for further pain control. Use Norco as needed for severe breakthrough pain.  Have caution, this may make you tired or groggy.  Do not drive or operate heavy machinery while taking this medicine. Use muscle creams (bengay, icy hot, salonpas) as needed for pain.  Follow up with your orthopedic doctor and/or neurosurgeon listed below for further evaluation of your symptoms.  You may benefit from physical therapy. Return to the ER if you develop high fevers, numbness, loss of bowel or bladder control, or any new or concerning symptoms.

## 2021-02-16 NOTE — ED Notes (Signed)
Pt standing in room with no assist needed

## 2021-02-16 NOTE — ED Notes (Signed)
ED Provider at bedside. 

## 2021-03-21 ENCOUNTER — Encounter (HOSPITAL_BASED_OUTPATIENT_CLINIC_OR_DEPARTMENT_OTHER): Payer: Self-pay | Admitting: Emergency Medicine

## 2021-03-21 ENCOUNTER — Emergency Department (HOSPITAL_BASED_OUTPATIENT_CLINIC_OR_DEPARTMENT_OTHER): Payer: Medicare HMO

## 2021-03-21 ENCOUNTER — Other Ambulatory Visit: Payer: Self-pay

## 2021-03-21 ENCOUNTER — Emergency Department (HOSPITAL_BASED_OUTPATIENT_CLINIC_OR_DEPARTMENT_OTHER)
Admission: EM | Admit: 2021-03-21 | Discharge: 2021-03-21 | Disposition: A | Discharge status: Home / Self Care | Payer: Medicare HMO | Attending: Emergency Medicine | Admitting: Emergency Medicine

## 2021-03-21 DIAGNOSIS — R519 Headache, unspecified: Secondary | ICD-10-CM | POA: Insufficient documentation

## 2021-03-21 DIAGNOSIS — I10 Essential (primary) hypertension: Secondary | ICD-10-CM

## 2021-03-21 DIAGNOSIS — Z87891 Personal history of nicotine dependence: Secondary | ICD-10-CM | POA: Diagnosis not present

## 2021-03-21 HISTORY — DX: Essential (primary) hypertension: I10

## 2021-03-21 LAB — CBC WITH DIFFERENTIAL/PLATELET
Abs Immature Granulocytes: 0 10*3/uL (ref 0.00–0.07)
Basophils Absolute: 0 10*3/uL (ref 0.0–0.1)
Basophils Relative: 1 %
Eosinophils Absolute: 0.2 10*3/uL (ref 0.0–0.5)
Eosinophils Relative: 4 %
HCT: 38 % — ABNORMAL LOW (ref 39.0–52.0)
Hemoglobin: 12.5 g/dL — ABNORMAL LOW (ref 13.0–17.0)
Immature Granulocytes: 0 %
Lymphocytes Relative: 47 %
Lymphs Abs: 2.1 10*3/uL (ref 0.7–4.0)
MCH: 29.1 pg (ref 26.0–34.0)
MCHC: 32.9 g/dL (ref 30.0–36.0)
MCV: 88.6 fL (ref 80.0–100.0)
Monocytes Absolute: 0.7 10*3/uL (ref 0.1–1.0)
Monocytes Relative: 15 %
Neutro Abs: 1.4 10*3/uL — ABNORMAL LOW (ref 1.7–7.7)
Neutrophils Relative %: 33 %
Platelets: 213 10*3/uL (ref 150–400)
RBC: 4.29 MIL/uL (ref 4.22–5.81)
RDW: 12.2 % (ref 11.5–15.5)
WBC: 4.4 10*3/uL (ref 4.0–10.5)
nRBC: 0 % (ref 0.0–0.2)

## 2021-03-21 LAB — BASIC METABOLIC PANEL
Anion gap: 9 (ref 5–15)
BUN: 12 mg/dL (ref 8–23)
CO2: 23 mmol/L (ref 22–32)
Calcium: 8.8 mg/dL — ABNORMAL LOW (ref 8.9–10.3)
Chloride: 104 mmol/L (ref 98–111)
Creatinine, Ser: 0.99 mg/dL (ref 0.61–1.24)
GFR, Estimated: >60 mL/min (ref >60)
Glucose, Bld: 95 mg/dL (ref 70–99)
Potassium: 3.4 mmol/L — ABNORMAL LOW (ref 3.5–5.1)
Sodium: 136 mmol/L (ref 135–145)

## 2021-03-21 LAB — TROPONIN I (HIGH SENSITIVITY): Troponin I (High Sensitivity): 3 ng/L (ref <18)

## 2021-03-21 MED ORDER — HYDROCHLOROTHIAZIDE 25 MG PO TABS: 25.0000 mg | ORAL_TABLET | Freq: Every day | ORAL | 0 refills | Status: DC

## 2021-03-21 NOTE — ED Provider Notes (Signed)
[place] [facility] DE[facility] Provider Note   CSN: [id number] Arrival date & time: 03/21/21  0143     History Chief Complaint  Patient presents with  . Hypertension  . Headache    Phillip Hughes is a 65 y.o. male.  The history is provided by the patient.  Hypertension This is a chronic problem. The current episode started more than 1 week ago. The problem occurs constantly. The problem has not changed since onset.Associated symptoms include headaches. Pertinent negatives include no chest pain, no abdominal pain and no shortness of breath. Nothing aggravates the symptoms. Nothing relieves the symptoms. Treatments tried: naproxen for headache  The treatment provided significant relief.  Headache Associated symptoms: no abdominal pain, no back pain, no congestion, no dizziness, no fever, no nausea, no neck pain, no numbness, no photophobia, no seizures, no vomiting and no weakness   Patient had a frontal headache and was noted to be hypertensive. No weakness, no numbness, no changes in vision or speech.  No neck pain or stiffness. Not sudden onset.  Took 2 alleve and symptoms resolved.  BP was still elevated so came in for check as he does not take medication for his HTN.  No CP, no SOB, no n/v/d.  No back nor neck pain.       Past Medical History:  Diagnosis Date  . Hypertension   . Sinusitis     There are no problems to display for this patient.   Past Surgical History:  Procedure Laterality Date  . BACK SURGERY    . HERNIA REPAIR    . HIP SURGERY    . NECK SURGERY         History reviewed. No pertinent family history.  Social History   Tobacco Use  . Smoking status: Former Research scientist (life sciences)  . Smokeless tobacco: Never Used  Vaping Use  . Vaping Use: Never used  Substance Use Topics  . Alcohol use: No  . Drug use: No    Home Medications Prior to Admission medications   Medication Sig Start Date End Date Taking? Authorizing Provider  atorvastatin (LIPITOR)  20 MG tablet Take 1 tablet by mouth daily. 11/02/20   [provider]  benzonatate (TESSALON) 100 MG capsule Take 1 capsule (100 mg total) by mouth every 8 (eight) hours. 02/26/18   Mabe, Forbes Cellar, MD  cyclobenzaprine (FLEXERIL) 10 MG tablet Take 1 tablet (10 mg total) by mouth 2 (two) times daily as needed for up to 10 doses for muscle spasms. 06/06/19   Curatolo, Adam, DO  fluticasone (FLONASE) 50 MCG/ACT nasal spray Place 1 spray into both nostrils daily. 02/26/18   Mabe, Forbes Cellar, MD  HYDROcodone-acetaminophen (NORCO/VICODIN) 5-325 MG tablet Take 1 tablet by mouth every 6 (six) hours as needed for severe pain. 02/16/21   Caccavale, Sophia, PA-C  methocarbamol (ROBAXIN) 500 MG tablet Take 1 tablet (500 mg total) by mouth 2 (two) times daily as needed for muscle spasms. 02/16/21   Caccavale, Sophia, PA-C  predniSONE (STERAPRED UNI-PAK 21 TAB) 10 MG (21) TBPK tablet Take by mouth daily. Take 6 tabs PO qd x 2 days, 5 tabs x2 days, 4 tabs x2 days, 3 tabs x2 days, 2 tabs x2 days, 1 tab x 2 days 02/16/21   Caccavale, Sophia, PA-C    Allergies    Gabapentin  Review of Systems   Review of Systems  Constitutional: Negative for diaphoresis and fever.  HENT: Negative for congestion.   Eyes: Negative for photophobia and visual disturbance.  Respiratory: Negative for shortness of breath.   Cardiovascular: Negative for chest pain.  Gastrointestinal: Negative for abdominal pain, nausea and vomiting.  Musculoskeletal: Negative for back pain and neck pain.  Skin: Negative for rash.  Neurological: Positive for headaches. Negative for dizziness, tremors, seizures, syncope, facial asymmetry, speech difficulty, weakness, light-headedness and numbness.  Psychiatric/Behavioral: Negative for agitation and confusion.  All other systems reviewed and are negative.   Physical Exam Updated Vital Signs BP (!) 161/91 (BP Location: Right Arm)   Pulse 71   Temp 98.2 F (36.8 C) (Oral)   Resp 18   Ht 5\' 7"  (1.702  m)   Wt 77.1 kg   SpO2 97%   BMI 26.63 kg/m   Physical Exam Vitals and nursing note reviewed.  Constitutional:      General: He is not in acute distress.    Appearance: He is well-developed. He is not diaphoretic.  HENT:     Head: Normocephalic and atraumatic.     Mouth/Throat:     Mouth: Mucous membranes are moist.  Eyes:     Extraocular Movements: Extraocular movements intact.     Pupils: Pupils are equal, round, and reactive to light.  Neck:     Meningeal: Brudzinski's sign and Kernig's sign absent.  Cardiovascular:     Rate and Rhythm: Normal rate and regular rhythm.     Pulses: Normal pulses.     Heart sounds: Normal heart sounds.  Pulmonary:     Effort: Pulmonary effort is normal.     Breath sounds: Normal breath sounds.  Abdominal:     General: Abdomen is flat. Bowel sounds are normal.     Palpations: Abdomen is soft.     Tenderness: There is no abdominal tenderness. There is no guarding.  Musculoskeletal:        General: Normal range of motion.     Cervical back: Normal range of motion and neck supple. No rigidity.  Skin:    General: Skin is warm and dry.     Capillary Refill: Capillary refill takes less than 2 seconds.  Neurological:     General: No focal deficit present.     Mental Status: He is alert and oriented to person, place, and time.     Cranial Nerves: No cranial nerve deficit.     Motor: No weakness.     Deep Tendon Reflexes: Reflexes normal.  Psychiatric:        Mood and Affect: Mood normal.        Behavior: Behavior normal.     ED Results / Procedures / Treatments   Labs (all labs ordered are listed, but only abnormal results are displayed) Results for orders placed or performed during the hospital encounter of 03/21/21  CBC with Differential/Platelet  Result Value Ref Range   WBC 4.4 4.0 - 10.5 K/uL   RBC 4.29 4.22 - 5.81 MIL/uL   Hemoglobin 12.5 (L) 13.0 - 17.0 g/dL   HCT 38.0 (L) 39.0 - 52.0 %   MCV 88.6 80.0 - 100.0 fL   MCH 29.1  26.0 - 34.0 pg   MCHC 32.9 30.0 - 36.0 g/dL   RDW 12.2 11.5 - 15.5 %   Platelets 213 150 - 400 K/uL   nRBC 0.0 0.0 - 0.2 %   Neutrophils Relative % 33 %   Neutro Abs 1.4 (L) 1.7 - 7.7 K/uL   Lymphocytes Relative 47 %   Lymphs Abs 2.1 0.7 - 4.0 K/uL   Monocytes Relative 15 %  Monocytes Absolute 0.7 0.1 - 1.0 K/uL   Eosinophils Relative 4 %   Eosinophils Absolute 0.2 0.0 - 0.5 K/uL   Basophils Relative 1 %   Basophils Absolute 0.0 0.0 - 0.1 K/uL   Immature Granulocytes 0 %   Abs Immature Granulocytes 0.00 0.00 - 0.07 K/uL  Basic metabolic panel  Result Value Ref Range   Sodium 136 135 - 145 mmol/L   Potassium 3.4 (L) 3.5 - 5.1 mmol/L   Chloride 104 98 - 111 mmol/L   CO2 23 22 - 32 mmol/L   Glucose, Bld 95 70 - 99 mg/dL   BUN 12 8 - 23 mg/dL   Creatinine, Ser 0.99 0.61 - 1.24 mg/dL   Calcium 8.8 (L) 8.9 - 10.3 mg/dL   GFR, Estimated >60 >60 mL/min   Anion gap 9 5 - 15  Troponin I (High Sensitivity)  Result Value Ref Range   Troponin I (High Sensitivity) 3 <18 ng/L   CT Head Wo Contrast  Result Date: 03/21/2021 CLINICAL DATA:  Hypertension and headache. EXAM: CT HEAD WITHOUT CONTRAST TECHNIQUE: Contiguous axial images were obtained from the base of the skull through the vertex without intravenous contrast. COMPARISON:  None. FINDINGS: Brain: No evidence of acute infarction, hemorrhage, hydrocephalus, extra-axial collection or mass lesion/mass effect. Vascular: No hyperdense vessel or unexpected calcification. Skull: Normal. Negative for fracture or focal lesion. Sinuses/Orbits: Very mild bilateral ethmoid sinus mucosal thickening is seen. Other: None. IMPRESSION: 1. No acute intracranial abnormality. 2. Very mild bilateral ethmoid sinus disease. Electronically Signed   By: Virgina Norfolk M.D.   On: 03/21/2021 02:47   DG Chest Portable 1 View  Result Date: 03/21/2021 CLINICAL DATA:  Hypertension and headache. EXAM: PORTABLE CHEST 1 VIEW COMPARISON:  February 26, 2018 FINDINGS: The  heart size and mediastinal contours are within normal limits. Both lungs are clear. Radiopaque operative hardware seen within the lower cervical spine. IMPRESSION: No active disease. Electronically Signed   By: Virgina Norfolk M.D.   On: 03/21/2021 02:48    EKG None  Radiology CT Head Wo Contrast  Result Date: 03/21/2021 CLINICAL DATA:  Hypertension and headache. EXAM: CT HEAD WITHOUT CONTRAST TECHNIQUE: Contiguous axial images were obtained from the base of the skull through the vertex without intravenous contrast. COMPARISON:  None. FINDINGS: Brain: No evidence of acute infarction, hemorrhage, hydrocephalus, extra-axial collection or mass lesion/mass effect. Vascular: No hyperdense vessel or unexpected calcification. Skull: Normal. Negative for fracture or focal lesion. Sinuses/Orbits: Very mild bilateral ethmoid sinus mucosal thickening is seen. Other: None. IMPRESSION: 1. No acute intracranial abnormality. 2. Very mild bilateral ethmoid sinus disease. Electronically Signed   By: Virgina Norfolk M.D.   On: 03/21/2021 02:47   DG Chest Portable 1 View  Result Date: 03/21/2021 CLINICAL DATA:  Hypertension and headache. EXAM: PORTABLE CHEST 1 VIEW COMPARISON:  February 26, 2018 FINDINGS: The heart size and mediastinal contours are within normal limits. Both lungs are clear. Radiopaque operative hardware seen within the lower cervical spine. IMPRESSION: No active disease. Electronically Signed   By: Virgina Norfolk M.D.   On: 03/21/2021 02:48    Procedures Procedures   Medications Ordered in ED Medications - No data to display  ED Course  I have reviewed the triage vital signs and the nursing notes.  Pertinent labs & imaging results that were available during my care of the patient were reviewed by me and considered in my medical decision making (see chart for details).    Chronic HTN not on medication.  Will start HCTZ.  No further headache, no signs of stroke on exam or head CT.  Patient  is stable for discharge with close follow up.  Charlene Cowdrey was evaluated in Emergency Department on 03/21/2021 for the symptoms described in the history of present illness. He was evaluated in the context of the global COVID-19 pandemic, which necessitated consideration that the patient might be at risk for infection with the SARS-CoV-2 virus that causes COVID-19. Institutional protocols and algorithms that pertain to the evaluation of patients at risk for COVID-19 are in a state of rapid change based on information released by regulatory bodies including the CDC and federal and state organizations. These policies and algorithms were followed during the patient's care in the ED.  Final Clinical Impression(s) / ED Diagnoses Return for intractable cough, coughing up blood, fevers >100.4 unrelieved by medication, shortness of breath, intractable vomiting, chest pain, shortness of breath, weakness, numbness, changes in speech, facial asymmetry, abdominal pain, passing out, Inability to tolerate liquids or food, cough, altered mental status or any concerns. No signs of systemic illness or infection. The patient is nontoxic-appearing on exam and vital signs are within normal limits.  I have reviewed the triage vital signs and the nursing notes. Pertinent labs & imaging results that were available during my care of the patient were reviewed by me and considered in my medical decision making (see chart for details). After history, exam, and medical workup I feel the patient has been appropriately medically screened and is safe for discharge home. Pertinent diagnoses were discussed with the patient. Patient was given return precautions.   Endiya Klahr, MD 03/21/21 [id number]

## 2021-03-21 NOTE — ED Triage Notes (Signed)
Patient arrived via POV c/o hypertension with headache. Patient seen by [facility], refused transport. Patient states s/s started at 1600. Patient states headache has resolved with 2 aleve approximately 45 minutes PTA. Patient is AO x 4, VS with elevated BP, normal gait.

## 2022-05-28 ENCOUNTER — Encounter (HOSPITAL_BASED_OUTPATIENT_CLINIC_OR_DEPARTMENT_OTHER): Payer: Self-pay

## 2022-05-28 ENCOUNTER — Emergency Department (HOSPITAL_BASED_OUTPATIENT_CLINIC_OR_DEPARTMENT_OTHER): Payer: Medicare Other

## 2022-05-28 ENCOUNTER — Emergency Department (HOSPITAL_BASED_OUTPATIENT_CLINIC_OR_DEPARTMENT_OTHER)
Admission: EM | Admit: 2022-05-28 | Discharge: 2022-05-28 | Disposition: A | Discharge status: Home / Self Care | Payer: Medicare Other | Attending: Emergency Medicine | Admitting: Emergency Medicine

## 2022-05-28 DIAGNOSIS — Z96641 Presence of right artificial hip joint: Secondary | ICD-10-CM | POA: Diagnosis not present

## 2022-05-28 DIAGNOSIS — M549 Dorsalgia, unspecified: Secondary | ICD-10-CM | POA: Insufficient documentation

## 2022-05-28 DIAGNOSIS — M79604 Pain in right leg: Secondary | ICD-10-CM | POA: Insufficient documentation

## 2022-05-28 DIAGNOSIS — M25551 Pain in right hip: Secondary | ICD-10-CM | POA: Insufficient documentation

## 2022-05-28 MED ORDER — MELOXICAM 7.5 MG PO TABS: 7.5000 mg | ORAL_TABLET | Freq: Every day | ORAL | 0 refills | Status: DC

## 2022-05-28 MED ORDER — OXYCODONE-ACETAMINOPHEN 5-325 MG PO TABS: 1.0000 | ORAL_TABLET | Freq: Four times a day (QID) | ORAL | 0 refills | Status: DC | PRN

## 2022-05-28 MED ORDER — OXYCODONE-ACETAMINOPHEN 5-325 MG PO TABS
1.0000 | ORAL_TABLET | Freq: Once | ORAL | Status: AC
  Administered 2022-05-28: 1 via ORAL
  Filled 2022-05-28: qty 1

## 2022-05-28 MED ORDER — CYCLOBENZAPRINE HCL 10 MG PO TABS: 10.0000 mg | ORAL_TABLET | Freq: Two times a day (BID) | ORAL | 0 refills | Status: DC | PRN

## 2022-05-28 NOTE — ED Provider Notes (Signed)
[facility] EMERGENCY DEPARTMENT Provider Note   CSN: [id number] Arrival date & time: 05/28/22  1005     History  Chief Complaint  Patient presents with   Leg Pain    Phillip Hughes is a 66 y.o. male.  He has a history of chronic back pain and right hip replacement right leg pain.  Complaining of worsening pain since Saturday when he helped somebody having some kitchen cabinets.  Pain is mostly in the right buttock and radiates down to about the knee.  Intermittently causes his leg to lock up and he cannot move.  Has been getting physical therapy for IT band.  Had some leftover meloxicam so took that without any improvement.  No abdominal pain no pain in his testicles.  No bowel or bladder incontinence.  No fever.  No trauma.  The history is provided by the patient and the spouse.  Leg Pain Location:  Hip, buttock and leg Time since incident:  5 days Injury: no   Hip location:  R hip Buttock location:  R buttock Leg location:  R upper leg Pain details:    Quality:  Aching and shooting   Severity:  Severe   Onset quality:  Gradual   Timing:  Intermittent   Progression:  Unchanged Chronicity:  Recurrent Dislocation: no   Relieved by:  Nothing Worsened by:  Bearing weight and exercise Ineffective treatments:  NSAIDs Associated symptoms: back pain   Associated symptoms: no fever, no muscle weakness, no numbness and no swelling        Home Medications Prior to Admission medications   Medication Sig Start Date End Date Taking? Authorizing Provider  atorvastatin (LIPITOR) 20 MG tablet Take 1 tablet by mouth daily. 11/02/20   [provider]  benzonatate (TESSALON) 100 MG capsule Take 1 capsule (100 mg total) by mouth every 8 (eight) hours. 02/26/18   Mabe, Forbes Cellar, MD  cyclobenzaprine (FLEXERIL) 10 MG tablet Take 1 tablet (10 mg total) by mouth 2 (two) times daily as needed for up to 10 doses for muscle spasms. 06/06/19   Curatolo, Adam, DO  fluticasone  (FLONASE) 50 MCG/ACT nasal spray Place 1 spray into both nostrils daily. 02/26/18   Mabe, Forbes Cellar, MD  hydrochlorothiazide (HYDRODIURIL) 25 MG tablet Take 1 tablet (25 mg total) by mouth daily. 03/21/21   Palumbo, April, MD  HYDROcodone-acetaminophen (NORCO/VICODIN) 5-325 MG tablet Take 1 tablet by mouth every 6 (six) hours as needed for severe pain. 02/16/21   Caccavale, Sophia, PA-C  methocarbamol (ROBAXIN) 500 MG tablet Take 1 tablet (500 mg total) by mouth 2 (two) times daily as needed for muscle spasms. 02/16/21   Caccavale, Sophia, PA-C  predniSONE (STERAPRED UNI-PAK 21 TAB) 10 MG (21) TBPK tablet Take by mouth daily. Take 6 tabs PO qd x 2 days, 5 tabs x2 days, 4 tabs x2 days, 3 tabs x2 days, 2 tabs x2 days, 1 tab x 2 days 02/16/21   Caccavale, Sophia, PA-C      Allergies    Gabapentin    Review of Systems   Review of Systems  Constitutional:  Negative for fever.  Gastrointestinal:  Negative for abdominal pain.  Genitourinary:  Negative for testicular pain.  Musculoskeletal:  Positive for back pain.  Skin:  Negative for rash.  Neurological:  Negative for weakness and numbness.    Physical Exam Updated Vital Signs BP 133/82 (BP Location: Right Arm)   Pulse 79   Temp 98.5 F (36.9 C) (Oral)   Resp  18   Ht '5\' 7"'$  (1.702 m)   Wt 71.7 kg   SpO2 99%   BMI 24.75 kg/m  Physical Exam Vitals and nursing note reviewed.  Constitutional:      General: He is not in acute distress.    Appearance: Normal appearance. He is well-developed.  HENT:     Head: Normocephalic and atraumatic.  Eyes:     Conjunctiva/sclera: Conjunctivae normal.  Cardiovascular:     Rate and Rhythm: Normal rate and regular rhythm.     Heart sounds: No murmur heard. Pulmonary:     Effort: Pulmonary effort is normal. No respiratory distress.     Breath sounds: Normal breath sounds.  Abdominal:     Palpations: Abdomen is soft.     Tenderness: There is no abdominal tenderness. There is no guarding or rebound.   Musculoskeletal:        General: Tenderness present. No swelling.     Cervical back: Neck supple.     Comments: He has some signs of muscle spasm in his right paralumbar and reproducible pain on palpation of sciatic notch and right buttock area.  No overlying skin changes.  Skin:    General: Skin is warm and dry.     Capillary Refill: Capillary refill takes less than 2 seconds.  Neurological:     General: No focal deficit present.     Mental Status: He is alert.     Sensory: No sensory deficit.     Motor: No weakness.     ED Results / Procedures / Treatments   Labs (all labs ordered are listed, but only abnormal results are displayed) Labs Reviewed - No data to display  EKG None  Radiology DG Hip Unilat With Pelvis 2-3 Views Right  Result Date: 05/28/2022 CLINICAL DATA:  Right hip pain.  Previous right hip arthroplasty. EXAM: DG HIP (WITH OR WITHOUT PELVIS) 2-3V RIGHT COMPARISON:  06/06/2019 FINDINGS: Bipolar hip prosthesis remains in appropriate position. No evidence of fracture or dislocation. No other bone lesions identified. IMPRESSION: Unremarkable appearance of right hip prosthesis. No acute findings. Electronically Signed   By: Marlaine Hind M.D.   On: 05/28/2022 11:31   DG Lumbar Spine Complete  Result Date: 05/28/2022 CLINICAL DATA:  Lumbar back pain. EXAM: LUMBAR SPINE - COMPLETE 4+ VIEW COMPARISON:  09/06/2017 FINDINGS: There is no evidence of lumbar spine fracture. Severe degenerative disc disease again seen at L2-3, with vacuum disc phenomenon and mild grade 1 degenerative retrolisthesis measuring approximately 4 mm. Lower lumbar facet DJD is seen bilaterally at L4-5 and L5-S1. No focal lytic or sclerotic bone lesions identified. IMPRESSION: No acute findings. Stable severe L2-3 degenerative disc disease and mild grade 1 degenerative retrolisthesis. Lower lumbar facet DJD. Electronically Signed   By: Marlaine Hind M.D.   On: 05/28/2022 11:30    Procedures Procedures     Medications Ordered in ED Medications - No data to display  ED Course/ Medical Decision Making/ A&P                           Medical Decision Making Amount and/or Complexity of Data Reviewed Radiology: ordered.  Risk Prescription drug management.  This patient complains of right hip and leg pain; this involves an extensive number of treatment Options and is a complaint that carries with it a high risk of complications and morbidity. The differential includes musculoskeletal pain, degenerative disc disease, radiculopathy, arthritis, fracture, dislocation  I ordered medication oxycodone  for his pain and reviewed PMP when indicated. I ordered imaging studies which included x-rays of the lumbar spine and right hip and pelvis and I independently    visualized and interpreted imaging which showed degenerative changes Additional history obtained from patient significant other Previous records obtained and reviewed orthopedic surgery 2 months ago with his chronic right hip pain Social determinants considered, no significant barriers Critical Interventions: None  After the interventions stated above, I reevaluated the patient and found patient to be neuro intact Admission and further testing considered, no indications for admission or further work-up at this time.  Will treat symptomatically recommended close follow-up with his treatment team.  Return instructions discussed          Final Clinical Impression(s) / ED Diagnoses Final diagnoses:  Right hip pain    Rx / DC Orders ED Discharge Orders          Ordered    cyclobenzaprine (FLEXERIL) 10 MG tablet  2 times daily PRN        05/28/22 1147    oxyCODONE-acetaminophen (PERCOCET/ROXICET) 5-325 MG tablet  Every 6 hours PRN        05/28/22 1147    meloxicam (MOBIC) 7.5 MG tablet  Daily        05/28/22 1147              Hayden Rasmussen, MD 05/28/22 1810

## 2022-05-28 NOTE — ED Triage Notes (Signed)
C/o right upper leg pain, radiating to right hip/back. Previous hip replacement in 2016

## 2022-05-28 NOTE — Discharge Instructions (Signed)
You were seen in the emergency department for pain in your right lower back and buttock radiating down your right leg.  You had x-rays of your spine and right hip that showed some arthritis changes.  We are prescribing you some anti-inflammatories muscle relaxant and pain medicine.  Please contact your primary care doctor for close follow-up.  Return to the emergency department if any worsening or concerning symptoms.

## 2022-06-27 IMAGING — DX DG CHEST 1V PORT
1 series · 1 of 1 positions shown · non-contrast
Comparison: February 26, 2018

CLINICAL DATA: Hypertension and headache.

EXAM:
PORTABLE CHEST 1 VIEW

[chest ap]
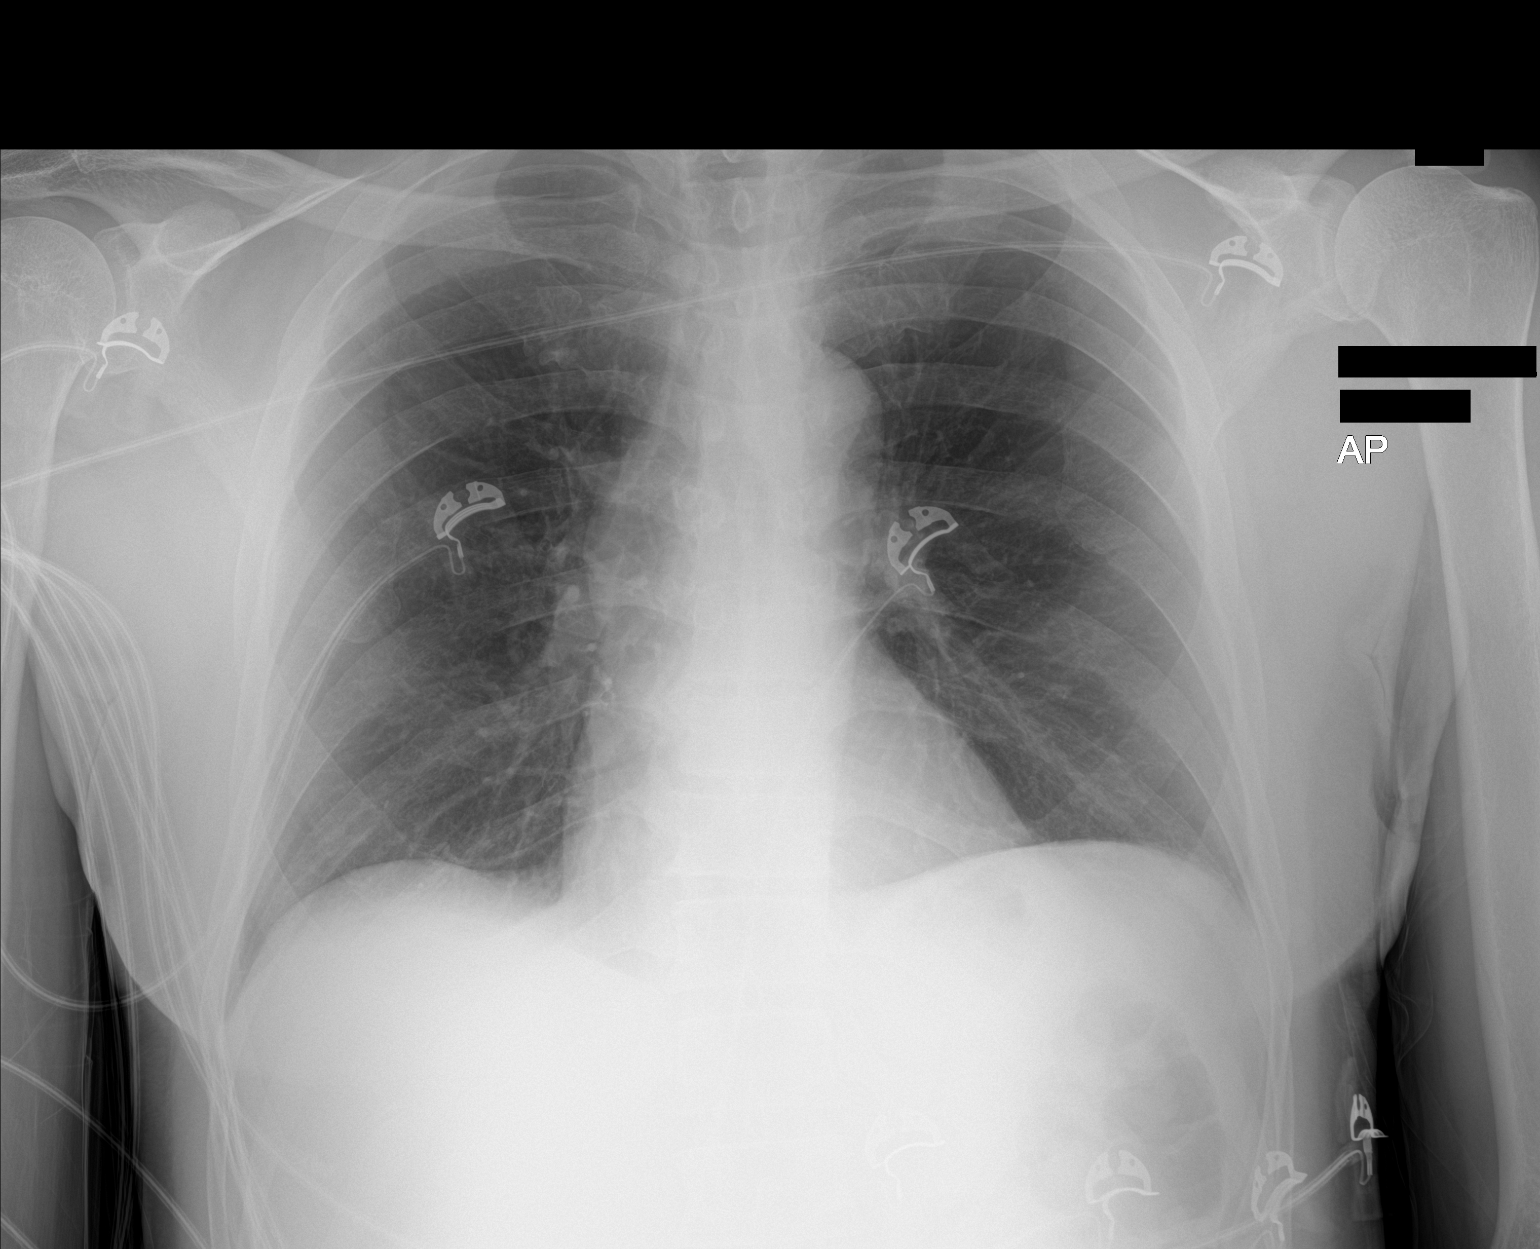

[1 of 1 positions shown; findings below may reference images not displayed]

FINDINGS: The heart size and mediastinal contours are within normal limits.
Both lungs are clear. Radiopaque operative hardware seen within the
lower cervical spine.
IMPRESSION: No active disease.

## 2022-07-16 ENCOUNTER — Emergency Department (HOSPITAL_BASED_OUTPATIENT_CLINIC_OR_DEPARTMENT_OTHER)
Admission: EM | Admit: 2022-07-16 | Discharge: 2022-07-17 | Disposition: A | Discharge status: Home / Self Care | Payer: Medicare Other | Attending: Emergency Medicine | Admitting: Emergency Medicine

## 2022-07-16 ENCOUNTER — Encounter (HOSPITAL_BASED_OUTPATIENT_CLINIC_OR_DEPARTMENT_OTHER): Payer: Self-pay | Admitting: Emergency Medicine

## 2022-07-16 ENCOUNTER — Other Ambulatory Visit: Payer: Self-pay

## 2022-07-16 DIAGNOSIS — Z87891 Personal history of nicotine dependence: Secondary | ICD-10-CM | POA: Insufficient documentation

## 2022-07-16 DIAGNOSIS — Z79899 Other long term (current) drug therapy: Secondary | ICD-10-CM | POA: Insufficient documentation

## 2022-07-16 DIAGNOSIS — M5441 Lumbago with sciatica, right side: Secondary | ICD-10-CM | POA: Insufficient documentation

## 2022-07-16 DIAGNOSIS — I10 Essential (primary) hypertension: Secondary | ICD-10-CM | POA: Insufficient documentation

## 2022-07-16 DIAGNOSIS — M545 Low back pain, unspecified: Secondary | ICD-10-CM | POA: Diagnosis present

## 2022-07-16 DIAGNOSIS — M7631 Iliotibial band syndrome, right leg: Secondary | ICD-10-CM | POA: Diagnosis not present

## 2022-07-16 DIAGNOSIS — M5431 Sciatica, right side: Secondary | ICD-10-CM

## 2022-07-16 HISTORY — DX: Sciatica, unspecified side: M54.30

## 2022-07-16 NOTE — ED Triage Notes (Signed)
Pt having right hip, knee and shin pain since April.  Pt has been getting PT.  Pt has been having issues with sciatica.   Pain has increased since last week.

## 2022-07-17 ENCOUNTER — Encounter (HOSPITAL_BASED_OUTPATIENT_CLINIC_OR_DEPARTMENT_OTHER): Payer: Self-pay | Admitting: Emergency Medicine

## 2022-07-17 MED ORDER — HYDROCODONE-ACETAMINOPHEN 5-325 MG PO TABS: 1.0000 | ORAL_TABLET | Freq: Four times a day (QID) | ORAL | 0 refills | Status: DC | PRN

## 2022-07-17 MED ORDER — NAPROXEN 250 MG PO TABS
500.0000 mg | ORAL_TABLET | Freq: Once | ORAL | Status: AC
  Administered 2022-07-17: 500 mg via ORAL
  Filled 2022-07-17: qty 2

## 2022-07-17 MED ORDER — MELOXICAM 15 MG PO TABS: 15.0000 mg | ORAL_TABLET | Freq: Every day | ORAL | 0 refills | Status: DC

## 2022-07-17 NOTE — ED Provider Notes (Signed)
[facility] Provider Note: Georgena Spurling, MD, FACEP  CSN: [id number] MRN: [id number] ARRIVAL: 07/16/22 at 2218 ROOM: [place]  Leg Pain   HISTORY OF PRESENT ILLNESS  07/17/22 12:48 AM Phillip Hughes is a 66 y.o. male with a history of sciatica since April.  He has been getting physical therapy for this at [facility].  The pain is in his right paralumbar region as well as his right lateral thigh (iliotibial band, per the patient) down to his lateral right knee.  The pain is worsened over the past week and he now rates it as an 8 out of 10, worse with movement.    Past Medical History:  Diagnosis Date   Hypertension    Sciatica    Sinusitis     Past Surgical History:  Procedure Laterality Date   BACK SURGERY     HERNIA REPAIR     HIP SURGERY     NECK SURGERY      History reviewed. No pertinent family history.  Social History   Tobacco Use   Smoking status: Former   Smokeless tobacco: Never  Scientific laboratory technician Use: Never used  Substance Use Topics   Alcohol use: No   Drug use: No    Prior to Admission medications   Medication Sig Start Date End Date Taking? Authorizing Provider  atorvastatin (LIPITOR) 20 MG tablet Take 1 tablet by mouth daily. 11/02/20   [provider]  hydrochlorothiazide (HYDRODIURIL) 25 MG tablet Take 1 tablet (25 mg total) by mouth daily. 03/21/21   Palumbo, April, MD  HYDROcodone-acetaminophen (NORCO/VICODIN) 5-325 MG tablet Take 1 tablet by mouth every 6 (six) hours as needed for severe pain. 07/17/22   Yoselyn Mcglade, MD  meloxicam (MOBIC) 15 MG tablet Take 1 tablet (15 mg total) by mouth daily. 07/17/22   Aydan Phoenix, MD    Allergies Gabapentin and Statins   REVIEW OF SYSTEMS  Negative except as noted here or in the History of Present Illness.   PHYSICAL EXAMINATION  Initial Vital Signs Blood pressure (!) 155/77, pulse 86, temperature 98.1 F (36.7 C), temperature source Oral, resp. rate 16, height  '5\' 7"'$  (1.702 m), weight 71.7 kg, SpO2 96 %.  Examination General: Well-developed, well-nourished male in no acute distress; appearance consistent with age of record HENT: normocephalic; atraumatic Eyes: Normal appearance Neck: supple Heart: regular rate and rhythm Lungs: clear to auscultation bilaterally Abdomen: soft; nondistended; nontender; bowel sounds present Back: Right paralumbar tenderness with positive straight leg raise on the right Extremities: No deformity; full range of motion; pulses normal; tenderness of right iliotibial band Neurologic: Awake, alert and oriented; motor function intact in all extremities and symmetric; no facial droop Skin: Warm and dry Psychiatric: Normal mood and affect   RESULTS  Summary of this visit's results, reviewed and interpreted by myself:   EKG Interpretation  Date/Time:    Ventricular Rate:    PR Interval:    QRS Duration:   QT Interval:    QTC Calculation:   R Axis:     Text Interpretation:         Laboratory Studies: No results found for this or any previous visit (from the past 24 hour(s)). Imaging Studies: No results found.  ED COURSE and MDM  Nursing notes, initial and subsequent vitals signs, including pulse oximetry, reviewed and interpreted by myself.  Vitals:   07/16/22 2232 07/16/22 2235  BP: (!) 155/77   Pulse: 86   Resp: 16  Temp: 98.1 F (36.7 C)   TempSrc: Oral   SpO2: 96%   Weight:  71.7 kg  Height:  '5\' 7"'$  (1.702 m)   Medications  naproxen (NAPROSYN) tablet 500 mg (has no administration in time range)   We will start patient on meloxicam for treatment of his iliotibial band inflammation and treat with a short course of a narcotic as well.  PROCEDURES  Procedures   ED DIAGNOSES     ICD-10-CM   1. Sciatica of right side  M54.31     2. Iliotibial band syndrome of right side  M76.31          Shamar Engelmann, Jenny Reichmann, MD 07/17/22 [phone]

## 2022-11-28 ENCOUNTER — Emergency Department (HOSPITAL_BASED_OUTPATIENT_CLINIC_OR_DEPARTMENT_OTHER)
Admission: EM | Admit: 2022-11-28 | Discharge: 2022-11-28 | Disposition: A | Discharge status: Home / Self Care | Payer: Medicare Other | Attending: Emergency Medicine | Admitting: Emergency Medicine

## 2022-11-28 ENCOUNTER — Encounter (HOSPITAL_BASED_OUTPATIENT_CLINIC_OR_DEPARTMENT_OTHER): Payer: Self-pay | Admitting: Emergency Medicine

## 2022-11-28 ENCOUNTER — Other Ambulatory Visit: Payer: Self-pay

## 2022-11-28 DIAGNOSIS — M25551 Pain in right hip: Secondary | ICD-10-CM | POA: Insufficient documentation

## 2022-11-28 DIAGNOSIS — I10 Essential (primary) hypertension: Secondary | ICD-10-CM | POA: Diagnosis not present

## 2022-11-28 DIAGNOSIS — M25561 Pain in right knee: Secondary | ICD-10-CM | POA: Diagnosis not present

## 2022-11-28 MED ORDER — DEXAMETHASONE SODIUM PHOSPHATE 10 MG/ML IJ SOLN
10.0000 mg | Freq: Once | INTRAMUSCULAR | Status: AC
  Administered 2022-11-28: 10 mg via INTRAMUSCULAR
  Filled 2022-11-28: qty 1

## 2022-11-28 MED ORDER — KETOROLAC TROMETHAMINE 30 MG/ML IJ SOLN
30.0000 mg | Freq: Once | INTRAMUSCULAR | Status: AC
  Administered 2022-11-28: 30 mg via INTRAMUSCULAR
  Filled 2022-11-28: qty 1

## 2022-11-28 MED ORDER — DICLOFENAC SODIUM 1 % EX GEL: 2.0000 g | Freq: Four times a day (QID) | CUTANEOUS | 0 refills | Status: AC | PRN

## 2022-11-28 NOTE — Discharge Instructions (Signed)

## 2022-11-28 NOTE — ED Notes (Signed)
Patient verbalized understanding of discharge instructions and reasons to return to the ED 

## 2022-11-28 NOTE — ED Provider Notes (Signed)
   Emergency Department Provider Note   I have reviewed the triage vital signs and the nursing notes.   HISTORY  Chief Complaint Hip Pain   HPI Phillip Hughes is a 66 y.o. male presents to the ED with right hip and knee pain. Patient reports this has been an ongoing but worsening pain to the right knee. He has been evaluated for hip and right leg pain in the past. No new injury. No CP or SOB. No fever.    Past Medical History:  Diagnosis Date   Hypertension    Sciatica    Sinusitis     Review of Systems  Constitutional: No fever/chills Cardiovascular: Denies chest pain. Respiratory: Denies shortness of breath. Gastrointestinal: No abdominal pain.  Musculoskeletal: Right leg/knee pain.   ____________________________________________   PHYSICAL EXAM:  VITAL SIGNS: ED Triage Vitals  Enc Vitals Group     BP 11/28/22 1312 (!) 153/77     Pulse Rate 11/28/22 1312 86     Resp 11/28/22 1312 18     Temp 11/28/22 1312 98.1 F (36.7 C)     Temp Source 11/28/22 1312 Oral     SpO2 11/28/22 1312 98 %     Weight 11/28/22 1313 164 lb (74.4 kg)   Constitutional: Alert and oriented. Well appearing and in no acute distress. Eyes: Conjunctivae are normal.  Head: Atraumatic. Nose: No congestion/rhinnorhea. Mouth/Throat: Mucous membranes are moist.  Oropharynx non-erythematous. Neck: No stridor.   Cardiovascular: Normal rate, regular rhythm. Good peripheral circulation. Grossly normal heart sounds.   Respiratory: Normal respiratory effort.  No retractions. Lungs CTAB. Gastrointestinal: Soft and nontender. No distention.  Musculoskeletal: No lower extremity tenderness nor edema. No gross deformities of extremities. No joint erythema/warmth.  Neurologic:  Normal speech and language. No gross focal neurologic deficits are appreciated.  Skin:  Skin is warm, dry and intact. No rash noted.  ____________________________________________   PROCEDURES  Procedure(s) performed:    Procedures  None ____________________________________________   INITIAL IMPRESSION / ASSESSMENT AND PLAN / ED COURSE  Pertinent labs & imaging results that were available during my care of the patient were reviewed by me and considered in my medical decision making (see chart for details).   This patient is Presenting for Evaluation of leg pain, which does require a range of treatment options, and is a complaint that involves a high risk of morbidity and mortality.  The Differential Diagnoses include MSK pain, fracture, dislocation, septic joint, fascitis, etc.   Radiologic Tests: Considered imaging but no bony tenderness or significant concern for new bony injury. Patient with chronic pain in this extremity. Plan for close ortho follow up.    Medical Decision Making: Summary:  Patient with acute on chronic pain in the right leg. No evidence of acute injury, septic joint, or DVT.   Reevaluation with update and discussion with patient. Plan for pain mgmt and close ortho follow up.   Patient's presentation is most consistent with acute illness / injury with system symptoms.   Disposition: discharge  ____________________________________________  FINAL CLINICAL IMPRESSION(S) / ED DIAGNOSES  Final diagnoses:  Right hip pain  Acute pain of right knee    Note:  This document was prepared using Dragon voice recognition software and may include unintentional dictation errors.  Nanda Quinton, MD, [facility] Emergency Medicine    Caleen Taaffe, Wonda Olds, MD 12/05/22 [phone]

## 2022-11-28 NOTE — ED Triage Notes (Signed)
Pt arrives pov, steady gait, c/o recurrent  RT hip and RT knee pain

## 2022-11-29 ENCOUNTER — Encounter: Payer: Self-pay | Admitting: Family Medicine

## 2022-11-29 ENCOUNTER — Ambulatory Visit (INDEPENDENT_AMBULATORY_CARE_PROVIDER_SITE_OTHER): Payer: Medicare Other | Admitting: Family Medicine

## 2022-11-29 VITALS — BP 130/70 | Ht 67.0 in | Wt 164.0 lb

## 2022-11-29 DIAGNOSIS — M5416 Radiculopathy, lumbar region: Secondary | ICD-10-CM | POA: Diagnosis not present

## 2022-11-29 DIAGNOSIS — G5732 Lesion of lateral popliteal nerve, left lower limb: Secondary | ICD-10-CM | POA: Insufficient documentation

## 2022-11-29 DIAGNOSIS — G629 Polyneuropathy, unspecified: Secondary | ICD-10-CM

## 2022-11-29 MED ORDER — KETOROLAC TROMETHAMINE 30 MG/ML IJ SOLN
30.0000 mg | Freq: Once | INTRAMUSCULAR | Status: AC
  Administered 2022-11-29: 30 mg via INTRAMUSCULAR

## 2022-11-29 MED ORDER — HYDROCODONE-ACETAMINOPHEN 5-325 MG PO TABS: 1.0000 | ORAL_TABLET | Freq: Four times a day (QID) | ORAL | 0 refills | Status: DC | PRN

## 2022-11-29 MED ORDER — CELECOXIB 200 MG PO CAPS: 200.0000 mg | ORAL_CAPSULE | Freq: Two times a day (BID) | ORAL | 2 refills | Status: DC | PRN

## 2022-11-29 NOTE — Patient Instructions (Signed)
Nice to meet you Please try heat  Please try the exercises  Please use the pain medicine as needed We have sent a referral for physical therapy  We'll make the referral for the nerve study/EMG  Please send me a message in [place] with any questions or updates.  Please see me back in 4 weeks.   --Dr. Raeford Razor

## 2022-11-29 NOTE — Assessment & Plan Note (Signed)
Acute on chronic in nature.  Reports right-sided radicular type pain.  Has a history of microdiscectomy.  Current symptoms have been ongoing since April with no improvement.  MRI was completed in September showing previous surgery -Counseled on home exercise therapy and supportive care. -Celebrex. -Referral to physical therapy. -Norco. -Could consider epidural

## 2022-11-29 NOTE — Assessment & Plan Note (Signed)
Acute on chronic in nature.  Reports bilateral feet having a burning sensation on the plantar aspect.  He also reports having nerve damage from her previous EMG from several years ago. -Counseled on supportive care. -Pursue EMG.

## 2022-11-29 NOTE — Progress Notes (Signed)
  Phillip Hughes - 66 y.o. male MRN [id number]  Date of birth: Jun 08, 1956  SUBJECTIVE:  Including CC & ROS.  No chief complaint on file.   Phillip Hughes is a 66 y.o. male that is presenting with acute on chronic right-sided leg pain.  He has a history of lumbar microdiscectomy.  Current symptoms been present since April.  Seem to be getting worse.  Also has had a right hip arthroplasty.  Notices the pain mainly in the right lateral aspect of the leg that goes down to his right foot.  He does endorse burning in the plantar aspect of his feet.   Independent review of the lumbar spine x-ray from 6/16 shows severe degenerative disc disease at L2-3 with mild retrolisthesis at this location. Independent review of the hip x-ray from 6/16 shows a arthroplasty of the right hip. Review of the MRI lumbar spine from 8/14 shows severe right foraminal stenosis at L2-3 and moderate spinal and left foraminal stenosis at this level as well.  Postop changes at L4-5  Review of Systems See HPI   HISTORY: Past Medical, Surgical, Social, and Family History Reviewed & Updated per EMR.   Pertinent Historical Findings include:  Past Medical History:  Diagnosis Date   Hypertension    Sciatica    Sinusitis     Past Surgical History:  Procedure Laterality Date   BACK SURGERY     HERNIA REPAIR     HIP SURGERY     NECK SURGERY       PHYSICAL EXAM:  VS: BP 130/70   Ht '5\' 7"'$  (1.702 m)   Wt 164 lb (74.4 kg)   BMI 25.69 kg/m  Physical Exam Gen: NAD, alert, cooperative with exam, well-appearing MSK:  Neurovascularly intact       ASSESSMENT & PLAN:   Lumbar radiculopathy Acute on chronic in nature.  Reports right-sided radicular type pain.  Has a history of microdiscectomy.  Current symptoms have been ongoing since April with no improvement.  MRI was completed in September showing previous surgery -Counseled on home exercise therapy and supportive care. -Celebrex. -Referral to physical  therapy. -Norco. -Could consider epidural  Polyneuropathy Acute on chronic in nature.  Reports bilateral feet having a burning sensation on the plantar aspect.  He also reports having nerve damage from her previous EMG from several years ago. -Counseled on supportive care. -Pursue EMG.

## 2022-12-01 ENCOUNTER — Encounter: Payer: Self-pay | Admitting: Family Medicine

## 2022-12-01 ENCOUNTER — Ambulatory Visit: Payer: Medicare Other | Admitting: Family Medicine

## 2022-12-01 ENCOUNTER — Ambulatory Visit: Payer: Self-pay

## 2022-12-01 VITALS — BP 136/74 | Ht 67.0 in | Wt 164.0 lb

## 2022-12-01 DIAGNOSIS — M222X1 Patellofemoral disorders, right knee: Secondary | ICD-10-CM | POA: Insufficient documentation

## 2022-12-01 DIAGNOSIS — M23203 Derangement of unspecified medial meniscus due to old tear or injury, right knee: Secondary | ICD-10-CM | POA: Insufficient documentation

## 2022-12-01 DIAGNOSIS — G629 Polyneuropathy, unspecified: Secondary | ICD-10-CM | POA: Diagnosis not present

## 2022-12-01 MED ORDER — TRIAMCINOLONE ACETONIDE 40 MG/ML IJ SUSP
40.0000 mg | Freq: Once | INTRAMUSCULAR | Status: AC
  Administered 2022-12-01: 40 mg via INTRA_ARTICULAR

## 2022-12-01 NOTE — Patient Instructions (Signed)
Good to see you Please try ice as needed  Please try the exercises  We have made a referral for the nerve study  Please send me a message in MyChart with any questions or updates.  Please see me back as scheduled.   --Dr. Raeford Razor

## 2022-12-01 NOTE — Progress Notes (Signed)
  Phillip Hughes - 66 y.o. male MRN [id number]  Date of birth: 1956/07/31  SUBJECTIVE:  Including CC & ROS.  No chief complaint on file.   Phillip Hughes is a 66 y.o. male that is presenting with acute worsening of his right knee pain.  Pain kept him up all night last night.  No injury.  No swelling or heat of the knee.    Review of Systems See HPI   HISTORY: Past Medical, Surgical, Social, and Family History Reviewed & Updated per EMR.   Pertinent Historical Findings include:  Past Medical History:  Diagnosis Date   Hypertension    Sciatica    Sinusitis     Past Surgical History:  Procedure Laterality Date   BACK SURGERY     HERNIA REPAIR     HIP SURGERY     NECK SURGERY       PHYSICAL EXAM:  VS: BP 136/74 (BP Location: Left Arm, Patient Position: Sitting)   Ht '5\' 7"'$  (1.702 m)   Wt 164 lb (74.4 kg)   BMI 25.69 kg/m  Physical Exam Gen: NAD, alert, cooperative with exam, well-appearing MSK:  Neurovascularly intact    Limited ultrasound: Right knee pain:  No effusion with the suprapatellar pouch. Normal-appearing lateral meniscus and lateral femoral condyle. Nonspecific hyperemia within the mid belly of the vastus lateralis and biceps femoris  Summary: No specific structural abnormalities.  Ultrasound and interpretation by Clearance Coots, MD  Aspiration/Injection Procedure Note Phillip Hughes [birth year]  Procedure: Injection Indications: Right knee pain  Procedure Details Consent: Risks of procedure as well as the alternatives and risks of each were explained to the (patient/caregiver).  Consent for procedure obtained. Time Out: Verified patient identification, verified procedure, site/side was marked, verified correct patient position, special equipment/implants available, medications/allergies/relevent history reviewed, required imaging and test results available.  Performed.  The area was cleaned with iodine and alcohol swabs.    The right knee superior  lateral suprapatellar pouch was injected using 1 cc of 1% lidocaine on a 22-gauge 1-1/2 inch needle.  The syringe was switched to mixture containing 1 cc's of 40 mg Kenalog and 4 cc's of 0.25% bupivacaine was injected.  Ultrasound was used. Images were obtained in long views showing the injection.     A sterile dressing was applied.  Patient did tolerate procedure well.     ASSESSMENT & PLAN:   Patellofemoral pain syndrome of right knee Acutely occurring knee pain.  The pain is anterior and lateral in nature.  Possible for component of radicular pain as the origin.  Does have a history of right hip arthroplasty. -Counseled on home exercise therapy and supportive care. -Injection today. -May need to consider epidural.

## 2022-12-01 NOTE — Assessment & Plan Note (Signed)
Acutely occurring knee pain.  The pain is anterior and lateral in nature.  Possible for component of radicular pain as the origin.  Does have a history of right hip arthroplasty. -Counseled on home exercise therapy and supportive care. -Injection today. -May need to consider epidural.

## 2022-12-01 NOTE — Addendum Note (Signed)
Addended by: Cresenciano Lick on: 12/01/2022 11:17 AM   Modules accepted: Orders

## 2022-12-08 ENCOUNTER — Other Ambulatory Visit: Payer: Self-pay

## 2022-12-08 ENCOUNTER — Emergency Department (HOSPITAL_BASED_OUTPATIENT_CLINIC_OR_DEPARTMENT_OTHER)
Admission: EM | Admit: 2022-12-08 | Discharge: 2022-12-08 | Disposition: A | Discharge status: Home / Self Care | Payer: Medicare Other | Attending: Emergency Medicine | Admitting: Emergency Medicine

## 2022-12-08 DIAGNOSIS — M5441 Lumbago with sciatica, right side: Secondary | ICD-10-CM | POA: Insufficient documentation

## 2022-12-08 DIAGNOSIS — Z79899 Other long term (current) drug therapy: Secondary | ICD-10-CM | POA: Insufficient documentation

## 2022-12-08 DIAGNOSIS — I10 Essential (primary) hypertension: Secondary | ICD-10-CM | POA: Diagnosis not present

## 2022-12-08 DIAGNOSIS — M545 Low back pain, unspecified: Secondary | ICD-10-CM | POA: Diagnosis present

## 2022-12-08 MED ORDER — KETOROLAC TROMETHAMINE 15 MG/ML IJ SOLN
30.0000 mg | Freq: Once | INTRAMUSCULAR | Status: AC
  Administered 2022-12-08: 30 mg via INTRAMUSCULAR
  Filled 2022-12-08: qty 2

## 2022-12-08 MED ORDER — PREDNISONE 20 MG PO TABS: 40.0000 mg | ORAL_TABLET | Freq: Every day | ORAL | 0 refills | Status: DC

## 2022-12-08 NOTE — Discharge Instructions (Signed)
Please use Tylenol or ibuprofen for pain.  You may use 600 mg ibuprofen every 6 hours or 1000 mg of Tylenol every 6 hours.  You may choose to alternate between the 2.  This would be most effective.  Not to exceed 4 g of Tylenol within 24 hours.  Not to exceed 3200 mg ibuprofen 24 hours.  Please take the steroids and prescribing follow-up with Dr. Raeford Razor as well as the nerve clinic.

## 2022-12-08 NOTE — ED Provider Notes (Signed)
[facility] Provider Note   CSN: [id number] Arrival date & time: 12/08/22  8242     History  Chief Complaint  Patient presents with   Back Pain    Phillip Hughes is a 66 y.o. male with past medical history significant for hypertension which is well-controlled on blood pressure medicine at this time, as well as sciatica who presents with concern for right-sided pain radiating into right leg and behind the knee.  Patient is being seen and evaluated for sciatica as well as polyneuropathy, has had one-time shot of Decadron, Toradol, and narcotic pain medication as well as Celebrex from emergency department as well as orthopedics, reports that he had minimal relief with Decadron, almost no relief with other pain medications.  Patient denies no frequent steroid use previously.  He denies any numbness, tingling, saddle anesthesia, previous history of cancer, chronic corticosteroid use, previous IV drug use.   Back Pain      Home Medications Prior to Admission medications   Medication Sig Start Date End Date Taking? Authorizing Provider  predniSONE (DELTASONE) 20 MG tablet Take 2 tablets (40 mg total) by mouth daily. 12/08/22  Yes Onell Mcmath H, PA-C  atorvastatin (LIPITOR) 20 MG tablet Take 1 tablet by mouth daily. 11/02/20   [provider]  celecoxib (CELEBREX) 200 MG capsule Take 1 capsule (200 mg total) by mouth 2 (two) times daily as needed. 11/29/22   Rosemarie Ax, MD  diclofenac Sodium (VOLTAREN) 1 % GEL Apply 2 g topically 4 (four) times daily as needed. 11/28/22   Long, Wonda Olds, MD  hydrochlorothiazide (HYDRODIURIL) 25 MG tablet Take 1 tablet (25 mg total) by mouth daily. 03/21/21   Palumbo, April, MD  HYDROcodone-acetaminophen (NORCO/VICODIN) 5-325 MG tablet Take 1 tablet by mouth every 6 (six) hours as needed for severe pain. 11/29/22   Rosemarie Ax, MD      Allergies    Gabapentin and Statins    Review of Systems    Review of Systems  Musculoskeletal:  Positive for back pain.  All other systems reviewed and are negative.   Physical Exam Updated Vital Signs BP 131/81   Pulse 87   Temp 98.9 F (37.2 C) (Oral)   Resp 17   SpO2 97%  Physical Exam Vitals and nursing note reviewed.  Constitutional:      General: He is not in acute distress.    Appearance: Normal appearance.  HENT:     Head: Normocephalic and atraumatic.  Eyes:     General:        Right eye: No discharge.        Left eye: No discharge.  Cardiovascular:     Rate and Rhythm: Normal rate and regular rhythm.     Pulses: Normal pulses.  Pulmonary:     Effort: Pulmonary effort is normal. No respiratory distress.  Musculoskeletal:        General: No deformity.     Comments: Patient with intact strength 5/5 bilateral lower extremities, some tenderness in the paraspinous muscles of the right lumbar spine.  No midline spinal tenderness throughout.  Normal gait and ambulation with some difficulty.  Skin:    General: Skin is warm and dry.     Capillary Refill: Capillary refill takes less than 2 seconds.  Neurological:     Mental Status: He is alert and oriented to person, place, and time.  Psychiatric:        Mood and Affect: Mood normal.  Behavior: Behavior normal.     ED Results / Procedures / Treatments   Labs (all labs ordered are listed, but only abnormal results are displayed) Labs Reviewed - No data to display  EKG None  Radiology No results found.  Procedures Procedures    Medications Ordered in ED Medications  ketorolac (TORADOL) 15 MG/ML injection 30 mg (has no administration in time range)    ED Course/ Medical Decision Making/ A&P                           Medical Decision Making Risk Prescription drug management.   Patient with back pain.  My emergent differential diagnosis includes slipped disc, compression fracture, spondylolisthesis, less clinical concern for epidural abscess or  osteomyelitis based on patient history.  Based on clinical presentation as well as recent history of think that ongoing sciatica related pain is most likely.  I reviewed previous evaluation in the emergency department and orthopedics for the same problem over the last several weeks.  Patient received Decadron IM x 1 several weeks ago, no other steroid use, he has been being treated with Norco, Toradol, OTC anti-inflammatories, and rehab exercises.  No neurological deficits. Patient is ambulatory. No warning symptoms of back pain including: fecal incontinence, urinary retention or overflow incontinence, night sweats, waking from sleep with back pain, unexplained fevers or weight loss, h/o cancer, IVDU, recent trauma. No concern for cauda equina, epidural abscess, or other serious cause of back pain.  Given this work-up, evaluation, physical exam I do not believe that radiographic imaging is indicated at this time.  Conservative measures such as rest, ice/heat, ibuprofen, Tylenol, and  prescription for prednisone X 5 days no improvement with other symptoms, and no longer trial of steroids tried at this time indicated with orthopedic follow-up if no improvement with conservative management.  Extensive return precautions given, patient discharged in stable condition at this time.  Final Clinical Impression(s) / ED Diagnoses Final diagnoses:  Acute right-sided low back pain with right-sided sciatica    Rx / DC Orders ED Discharge Orders          Ordered    predniSONE (DELTASONE) 20 MG tablet  Daily        12/08/22 1119              Dorien Chihuahua 12/08/22 1129    Davonna Belling, MD 12/09/22 [phone]

## 2022-12-08 NOTE — ED Triage Notes (Signed)
Patient presents to ED via POV from home. Here with right sided back pain that radiates down right leg. Denies recent injury or trauma. Denies urinary incontinence or saddle paraesthesia. Ambulatory with limping gait.

## 2022-12-14 ENCOUNTER — Encounter: Payer: Self-pay | Admitting: Medical

## 2022-12-14 ENCOUNTER — Other Ambulatory Visit (HOSPITAL_BASED_OUTPATIENT_CLINIC_OR_DEPARTMENT_OTHER): Payer: Self-pay

## 2022-12-14 ENCOUNTER — Ambulatory Visit (INDEPENDENT_AMBULATORY_CARE_PROVIDER_SITE_OTHER): Payer: Managed Care, Other (non HMO) | Admitting: Medical

## 2022-12-14 VITALS — BP 136/82 | HR 90 | Resp 18 | Ht 67.0 in | Wt 170.4 lb

## 2022-12-14 DIAGNOSIS — E785 Hyperlipidemia, unspecified: Secondary | ICD-10-CM

## 2022-12-14 DIAGNOSIS — M5416 Radiculopathy, lumbar region: Secondary | ICD-10-CM

## 2022-12-14 DIAGNOSIS — M222X1 Patellofemoral disorders, right knee: Secondary | ICD-10-CM

## 2022-12-14 DIAGNOSIS — I10 Essential (primary) hypertension: Secondary | ICD-10-CM | POA: Diagnosis not present

## 2022-12-14 MED ORDER — HYDROCODONE-ACETAMINOPHEN 5-325 MG PO TABS
1.0000 | ORAL_TABLET | Freq: Four times a day (QID) | ORAL | 0 refills | Status: DC | PRN
  Filled 2022-12-14: qty 20, 5d supply, fill #0

## 2022-12-14 NOTE — Patient Instructions (Addendum)
Htn- bp well controlled. Continue hctz 25 mg daily.  High cholesterol- continue atorvastatin. Cmp and lipid panel recently done.  Lumbar radicular pain and knee pain. Being seen by sports med MD and emg pending. Will refill norco pending emg studies.    Follow up one month or sooner if needed.

## 2022-12-14 NOTE — Progress Notes (Signed)
Subjective:    Patient ID: Phillip Hughes, male    DOB: [birth year], 67 y.o.   MRN: [id number]  HPI Pt in for first time. Was with [facility].  Pt former Sports coach. Pt does some exercise. Has bo flex. States he is eating healthy. States weaning off processed foods. Former smoker. Quit year 2000. Rare alcohol use. Only special occasions. 1 cup coffee a day.   High cholesterol- on atorvastatin 20 mg daily.  Htn- on hctz 25 mg daily. On since 2018.  Former rt hip replacement surgery, lumbar back surgery and neck surgery. Surgeries in 2016. Presently states dealing with sciatica type nerve pain. Pt has seen Dr. Raeford Razor recently for symptoms. Pt is having emg upcoming on December 24, 2021.  Pt recently used celebrex and then prednisone. He also had to use norco.  Formerly failed tramadol. He states tried tramadol 50 mg yesterday. Borrowed his wife tablet.  Pt also has some rt knee pain. He states DrSchmitz gave injection in past to knee and pain improved in past.  Currently declines pneumonia vaccine. Discussed with pt shingrix vaccine. Pt will think about.  Most recent labs showed ldl of 102. Recent metabolic panel shows good kidney function and normal liver enzymes.    Review of Systems  Constitutional:  Negative for chills, fatigue and fever.  HENT:  Negative for congestion, drooling and ear pain.   Respiratory:  Negative for cough, chest tightness, shortness of breath and wheezing.   Cardiovascular:  Negative for chest pain and palpitations.  Gastrointestinal:  Negative for abdominal pain, blood in stool, constipation, nausea and vomiting.  Genitourinary:  Negative for dysuria, flank pain and frequency.  Musculoskeletal:  Negative for back pain, joint swelling and neck stiffness.  Skin:  Negative for rash.  Neurological:  Negative for dizziness, speech difficulty, weakness, numbness and headaches.  Hematological:  Negative for adenopathy. Does not bruise/bleed easily.   Psychiatric/Behavioral:  Negative for behavioral problems, decreased concentration and hallucinations.    Past Medical History:  Diagnosis Date   Hypertension    Sciatica    Sinusitis      Social History   Socioeconomic History   Marital status: Married    Spouse name: Not on file   Number of children: Not on file   Years of education: Not on file   Highest education level: Not on file  Occupational History   Not on file  Tobacco Use   Smoking status: Former    Packs/day: 1.00    Years: 28.00    Total pack years: 28.00    Types: Cigarettes   Smokeless tobacco: Never  Vaping Use   Vaping Use: Never used  Substance and Sexual Activity   Alcohol use: Yes    Comment: rarely   Drug use: No   Sexual activity: Not on file  Other Topics Concern   Not on file  Social History Narrative   Not on file   Social Determinants of Health   Financial Resource Strain: Not on file  Food Insecurity: Not on file  Transportation Needs: Not on file  Physical Activity: Not on file  Stress: Not on file  Social Connections: Not on file  Intimate Partner Violence: Not on file    Past Surgical History:  Procedure Laterality Date   [place]      History reviewed. No pertinent family history.  Allergies  Allergen Reactions   Gabapentin Other (See Comments)    Mood Swings and Depression Mood Swings and Depression Mood Swings and Depression Mood Swings and Depression    Statins Other (See Comments)    Current Outpatient Medications on File Prior to Visit  Medication Sig Dispense Refill   atorvastatin (LIPITOR) 20 MG tablet Take 1 tablet by mouth daily.     celecoxib (CELEBREX) 200 MG capsule Take 1 capsule (200 mg total) by mouth 2 (two) times daily as needed. 60 capsule 2   diclofenac Sodium (VOLTAREN) 1 % GEL Apply 2 g topically 4 (four) times daily as needed. 100 g 0   hydrochlorothiazide (HYDRODIURIL) 25 MG tablet  Take 1 tablet (25 mg total) by mouth daily. 30 tablet 0   HYDROcodone-acetaminophen (NORCO/VICODIN) 5-325 MG tablet Take 1 tablet by mouth every 6 (six) hours as needed for severe pain. 15 tablet 0   predniSONE (DELTASONE) 20 MG tablet Take 2 tablets (40 mg total) by mouth daily. 10 tablet 0   No current facility-administered medications on file prior to visit.    BP 136/82   Pulse 90   Resp 18   Ht '5\' 7"'$  (1.702 m)   Wt 170 lb 6.4 oz (77.3 kg)   SpO2 99%   BMI 26.69 kg/m        Objective:   Physical Exam  General Appearance- Not in acute distress.    Chest and Lung Exam Auscultation: Breath sounds:-Normal. Clear even and unlabored. Adventitious sounds:- No Adventitious sounds.  Cardiovascular Auscultation:Rythm - Regular, rate and rythm. Heart Sounds -Normal heart sounds.  Abdomen Inspection:-Inspection Normal.  Palpation/Perucssion: Palpation and Percussion of the abdomen reveal- Non Tender, No Rebound tenderness, No rigidity(Guarding) and No Palpable abdominal masses.  Liver:-Normal.  Spleen:- Normal.   Back Mid lumbar spine tenderness to palpation. Pain on straight leg lift. Pain on lateral movements and flexion/extension of the spine.  Lower ext neurologic  L5-S1 sensation intact bilaterally. Normal patellar reflexes bilaterally. No foot drop bilaterally.       Assessment & Plan:   Patient Instructions  Htn- bp well controlled. Continue hctz 25 mg daily.  High cholesterol- continue atorvastatin. Cmp and lipid panel recently done.  Lumbar radicular pain and knee pain. Being seen by sports med MD and emg pending. Will refill norco pending emg studies.    Follow up one month or sooner if needed.   Mackie Pai, PA-C

## 2022-12-21 ENCOUNTER — Encounter: Payer: Self-pay | Admitting: Physical Therapy

## 2022-12-21 ENCOUNTER — Ambulatory Visit: Payer: Medicare (Managed Care) | Attending: Family Medicine | Admitting: Physical Therapy

## 2022-12-21 DIAGNOSIS — M5441 Lumbago with sciatica, right side: Secondary | ICD-10-CM | POA: Diagnosis not present

## 2022-12-21 DIAGNOSIS — G8929 Other chronic pain: Secondary | ICD-10-CM | POA: Insufficient documentation

## 2022-12-21 DIAGNOSIS — R252 Cramp and spasm: Secondary | ICD-10-CM | POA: Insufficient documentation

## 2022-12-21 DIAGNOSIS — M5416 Radiculopathy, lumbar region: Secondary | ICD-10-CM | POA: Diagnosis not present

## 2022-12-21 NOTE — Therapy (Signed)
OUTPATIENT PHYSICAL THERAPY THORACOLUMBAR EVALUATION   Patient Name: Phillip Hughes MRN: [id number] DOB:1956/10/24, 67 y.o., male Today's Date: 12/21/2022  END OF SESSION:  PT End of Session - 12/21/22 1548     Visit Number 1    Number of Visits 12    Date for PT Re-Evaluation 02/01/23    Authorization Type cigna    PT Start Time [phone]    PT Stop Time 0930    PT Time Calculation (min) 40 min    Activity Tolerance Patient tolerated treatment well    Behavior During Therapy [facility] for tasks assessed/performed             Past Medical History:  Diagnosis Date   Hypertension    Sciatica    Sinusitis    Past Surgical History:  Procedure Laterality Date   [place]     NECK SURGERY     Patient Active Problem List   Diagnosis Date Noted   Patellofemoral pain syndrome of right knee 12/01/2022   Lumbar radiculopathy 11/29/2022   Polyneuropathy 11/29/2022    PCP: Mackie Pai, PA-C  REFERRING PROVIDER: Rosemarie Ax, MD   REFERRING DIAG: M54.16 (ICD-10-CM) - Lumbar radiculopathy   Rationale for Evaluation and Treatment: Rehabilitation  THERAPY DIAG:  Chronic right-sided low back pain with right-sided sciatica  Cramp and spasm  ONSET DATE: April 2022  SUBJECTIVE:                                                                                                                                                                                           SUBJECTIVE STATEMENT: Patient reports history of back pain for years, he has had prior back surgery, but most recent episode started last April.  He has not done anything for his back other than pain medication, declined injections, but did try try chiropractic for 12 visits, got some relief but pain came back.  Has nerve study scheduled for Friday.  Has gone to ED 2x due to his back pain.   PERTINENT HISTORY:  R hip replacement, ACDF, L4-5 lumbar laminectomy 2017  PAIN:  Are you  having pain? Yes: NPRS scale: 6-10/10 Pain location: right side low back radiates down R leg mostly to knee, knee always feels sore, occasionally goes to shin Pain description: throbbing Aggravating factors: walking, standing Relieving factors: pain medication  PRECAUTIONS: None  WEIGHT BEARING RESTRICTIONS: No  FALLS:  Has patient fallen in last 6 months? Yes. Number of falls 1 -off ladder while trimming bushes, ladder was on grass and tipped. No falls due to  loss of balance.   LIVING ENVIRONMENT: Lives with: lives with their spouse Lives in: House/apartment Stairs: Yes: Internal: 15 steps; on right going up and to basement, has trouble going up, sometimes goes out basement door and walks around house rather than go up stairs.  Has following equipment at home: Single point cane and Walker - 2 wheeled  OCCUPATION: retired  PLOF: Independent, Psychologist, occupational work at [employer] to be able to play golf, had to stop playing golf due to pain  PATIENT GOALS: decrease pain, be able to play golf again  NEXT MD VISIT: 12/31/2022  OBJECTIVE:   DIAGNOSTIC FINDINGS:  05/28/2022 DG lumbar spine FINDINGS: There is no evidence of lumbar spine fracture. Severe degenerative disc disease again seen at L2-3, with vacuum disc phenomenon and mild grade 1 degenerative retrolisthesis measuring approximately 4 mm. Lower lumbar facet DJD is seen bilaterally at L4-5 and L5-S1. No focal lytic or sclerotic bone lesions identified.   IMPRESSION: No acute findings.   Stable severe L2-3 degenerative disc disease and mild grade 1 degenerative retrolisthesis.   Lower lumbar facet DJD.  PATIENT SURVEYS:  Modified Oswestry 31/50 =62%   SCREENING FOR RED FLAGS: Bowel or bladder incontinence: No Spinal tumors: No Cauda equina syndrome: No Compression fracture: No Abdominal aneurysm: No  COGNITION: Overall cognitive status: Within functional limits for tasks assessed     SENSATION: WFL  MUSCLE  LENGTH: Hamstrings: Right 60 deg; Left 60 deg   POSTURE: decreased lumbar lordosis  PALPATION: Tenderness R lumbar paraspinals, L2/L3.  Decreased mobility L3-5  LUMBAR ROM:   AROM eval  Flexion Almost to toes, increased knee pain  Extension Wnl, increased pain  Right lateral flexion Past knee, tight  Left lateral flexion Past knee, tight  Right rotation WNL, increased radicular pain  Left rotation WNL increased LBP   (Blank rows = not tested)  LOWER EXTREMITY ROM:     Passive  Right eval Left eval  Hip flexion 70 70  Hip internal rotation 10 25  Hip external rotation 30 30   (Blank rows = not tested)  LOWER EXTREMITY MMT:    MMT Right eval Left eval  Hip flexion 5 5  Hip extension 5 5  Hip abduction 5 5  Hip adduction 5 5  Knee flexion 5 5  Knee extension 5 5  Ankle dorsiflexion 5 5   (Blank rows = not tested)  LUMBAR SPECIAL TESTS:  Straight leg raise test: Negative and Slump test: Negative  FUNCTIONAL TESTS:  5 times sit to stand: 16 seconds, increased LBP  GAIT: Distance walked: 50 Assistive device utilized: None Level of assistance: Complete Independence Comments: very antalgic  TODAY'S TREATMENT:                                                                                                                              DATE:   12/21/2022 Therapeutic Exercise: to improve strength and mobility.  Demo, verbal and tactile cues  throughout for technique. Prone knee bends x 10 bil  Prone leg extensions x 10 bil  Prone press-ups on elbows x 10    PATIENT EDUCATION:  Education details: findings, POC, interventions.  Briefly discussed dry needling, interested, initial HEP.  Person educated: Patient Education method: Explanation, Demonstration, Verbal cues, and Handouts Education comprehension: verbalized understanding and returned demonstration  HOME EXERCISE PROGRAM: Access Code: [email or web address] URL: [email or web address] Date:  12/21/2022 Prepared by: Glenetta Hew  Exercises - Prone Knee Flexion  - 1 x daily - 7 x weekly - 3 sets - 10 reps - Prone Hip Extension  - 1 x daily - 7 x weekly - 3 sets - 10 reps - Prone Press Up On Elbows  - 1 x daily - 7 x weekly - 3 sets - 10 reps  ASSESSMENT:  CLINICAL IMPRESSION: Patient is a 67 y.o. male who was seen today for physical therapy evaluation and treatment for right sided low back pain with radiculopathy.  He reports pain started in April 2022 and radiates down to R knee.  He has history of prior back surgery and R THA, and noted tightness in both hips.  Patient was given initial HEP and would benefit from skilled physical therapy to improve hip/lumbopelvic mobility, decrease pain and improve QOL.   OBJECTIVE IMPAIRMENTS: Abnormal gait, decreased endurance, decreased mobility, difficulty walking, decreased ROM, hypomobility, increased fascial restrictions, increased muscle spasms, impaired flexibility, postural dysfunction, and pain.   ACTIVITY LIMITATIONS: carrying, lifting, bending, sitting, standing, squatting, sleeping, stairs, transfers, and locomotion level  PARTICIPATION LIMITATIONS: cleaning, laundry, shopping, and community activity  PERSONAL FACTORS: Past/current experiences, Time since onset of injury/illness/exacerbation, and 1-2 comorbidities: severe degenerative disc disease, R THA, chronic low back pain  are also affecting patient's functional outcome.   REHAB POTENTIAL: Good  CLINICAL DECISION MAKING: Evolving/moderate complexity  EVALUATION COMPLEXITY: Moderate   GOALS: Goals reviewed with patient? Yes  SHORT TERM GOALS: Target date: 01/04/2023   Patient will be independent with initial HEP.  Baseline: given Goal status: INITIAL  2.  Patient will report centralization of radicular symptoms.  Baseline: radiates to R knee Goal status: INITIAL  LONG TERM GOALS: Target date: 02/01/2023    Patient will be independent with advanced/ongoing  HEP to improve outcomes and carryover.  Baseline:  Goal status: INITIAL  2.  Patient will report 75% improvement in low back pain to improve QOL.  Baseline:  Goal status: INITIAL  3.  Patient will demonstrate full pain free lumbar ROM to perform ADLs.   Baseline: see objective Goal status: INITIAL  4.  Patient will report 15% improvement on modified Oswestry to demonstrate improved functional ability.  Baseline: 62% disability  Goal status: INITIAL   5.  Patient will tolerate 30 min of walking to return to golf. Baseline: unable Goal status: INITIAL  PLAN:  PT FREQUENCY: 1-2x/week  PT DURATION: 6 weeks  PLANNED INTERVENTIONS: Therapeutic exercises, Therapeutic activity, Neuromuscular re-education, Balance training, Gait training, Patient/Family education, Self Care, Joint mobilization, Stair training, Dry Needling, Electrical stimulation, Spinal mobilization, Cryotherapy, Moist heat, Traction, Ultrasound, Manual therapy, and Re-evaluation.  PLAN FOR NEXT SESSION: review and progress HEP- neutral spine, extension if tolerated starting in prone, can try traction for radicular symptoms, dry needling/manual therapy, modalities PRN   Rennie Natter, PT, DPT  12/21/2022, 4:08 PM

## 2022-12-23 ENCOUNTER — Ambulatory Visit: Payer: Medicare (Managed Care)

## 2022-12-23 DIAGNOSIS — M5441 Lumbago with sciatica, right side: Secondary | ICD-10-CM | POA: Diagnosis not present

## 2022-12-23 DIAGNOSIS — R252 Cramp and spasm: Secondary | ICD-10-CM

## 2022-12-23 DIAGNOSIS — G8929 Other chronic pain: Secondary | ICD-10-CM

## 2022-12-23 NOTE — Therapy (Signed)
OUTPATIENT PHYSICAL THERAPY TREATMENT   Patient Name: Phillip Hughes MRN: [id number] DOB:10/29/1956, 67 y.o., male Today's Date: 12/23/2022  END OF SESSION:  PT End of Session - 12/23/22 0907     Visit Number 2    Number of Visits 12    Date for PT Re-Evaluation 02/01/23    Authorization Type cigna    PT Start Time [phone]    PT Stop Time 0928    PT Time Calculation (min) 41 min    Activity Tolerance Patient tolerated treatment well    Behavior During Therapy [facility] for tasks assessed/performed              Past Medical History:  Diagnosis Date   Hypertension    Sciatica    Sinusitis    Past Surgical History:  Procedure Laterality Date   BACK SURGERY     HERNIA REPAIR     HIP SURGERY     NECK SURGERY     Patient Active Problem List   Diagnosis Date Noted   Patellofemoral pain syndrome of right knee 12/01/2022   Lumbar radiculopathy 11/29/2022   Polyneuropathy 11/29/2022    PCP: Mackie Pai, PA-C  REFERRING PROVIDER: Rosemarie Ax, MD   REFERRING DIAG: M54.16 (ICD-10-CM) - Lumbar radiculopathy   Rationale for Evaluation and Treatment: Rehabilitation  THERAPY DIAG:  Chronic right-sided low back pain with right-sided sciatica  Cramp and spasm  ONSET DATE: April 2022  SUBJECTIVE:                                                                                                                                                                                           SUBJECTIVE STATEMENT: Patient reports the hydrocodone they gave him has helped with pain. He is doing a nerve study tomorrow.  PERTINENT HISTORY:  R hip replacement, ACDF, L4-5 lumbar laminectomy 2017  PAIN:  Are you having pain? Yes: NPRS scale: 3/10 Pain location: right side low back radiates down R anterior thigh Pain description: throbbing Aggravating factors: walking, standing Relieving factors: pain medication  PRECAUTIONS: None  WEIGHT BEARING RESTRICTIONS: No  FALLS:  Has  patient fallen in last 6 months? Yes. Number of falls 1 -off ladder while trimming bushes, ladder was on grass and tipped. No falls due to loss of balance.   LIVING ENVIRONMENT: Lives with: lives with their spouse Lives in: House/apartment Stairs: Yes: Internal: 15 steps; on right going up and to basement, has trouble going up, sometimes goes out basement door and walks around house rather than go up stairs.  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 Severe degenerative disc disease again seen at L2-3, with vacuum disc phenomenon and mild grade 1 degenerative retrolisthesis measuring approximately 4 mm. Lower lumbar facet DJD is seen bilaterally at L4-5 and L5-S1. No focal lytic or sclerotic bone lesions identified.   IMPRESSION: No acute findings.   Stable severe L2-3 degenerative disc disease and mild grade 1 degenerative retrolisthesis.   Lower lumbar facet DJD.  PATIENT SURVEYS:  Modified Oswestry 31/50 =62%   SCREENING FOR RED FLAGS: Bowel or bladder incontinence: No Spinal tumors: No Cauda equina syndrome: No Compression fracture: No Abdominal aneurysm: No  COGNITION: Overall cognitive status: Within functional limits for tasks assessed     SENSATION: WFL  MUSCLE LENGTH: Hamstrings: Right 60 deg; Left 60 deg   POSTURE: decreased lumbar lordosis  PALPATION: Tenderness R lumbar paraspinals, L2/L3.  Decreased mobility L3-5  LUMBAR ROM:   AROM eval  Flexion Almost to toes, increased knee pain  Extension Wnl, increased pain  Right lateral flexion Past knee, tight  Left lateral flexion Past  knee, tight  Right rotation WNL, increased radicular pain  Left rotation WNL increased LBP   (Blank rows = not tested)  LOWER EXTREMITY ROM:     Passive  Right eval Left eval  Hip flexion 70 70  Hip internal rotation 10 25  Hip external rotation 30 30   (Blank rows = not tested)  LOWER EXTREMITY MMT:    MMT Right eval Left eval  Hip flexion 5 5  Hip extension 5 5  Hip abduction 5 5  Hip adduction 5 5  Knee flexion 5 5  Knee extension 5 5  Ankle dorsiflexion 5 5   (Blank rows = not tested)  LUMBAR SPECIAL TESTS:  Straight leg raise test: Negative and Slump test: Negative  FUNCTIONAL TESTS:  5 times sit to stand: 16 seconds, increased LBP  GAIT: Distance walked: 50 Assistive device utilized: None Level of assistance: Complete Independence Comments: very antalgic  TODAY'S TREATMENT:                                                                                                                              DATE:   12/23/22 Therapeutic Exercise: to improve strength and mobility.  Demo, verbal and tactile cues throughout for technique. POE  Prone knee bends x 10 bil Prone hip extension x 10 bil Prone quad and hip flexor stretch with strap 2x30 sec - more tight on L side Bridges x 10 - progressed x 10 with GTB  S/L clamshells GTB R/L x 10  S/L hip abduction x 20 R/L Superman x 5  12/21/2022 Therapeutic  Exercise: to improve strength and mobility.  Demo, verbal and tactile cues throughout for technique. Prone knee bends x 10 bil  Prone leg extensions x 10 bil  Prone press-ups on elbows x 10    PATIENT EDUCATION:  Education details: HEP update  Person educated: Patient Education method: Explanation, Demonstration, Verbal cues, and Handouts Education comprehension: verbalized understanding and returned demonstration  HOME EXERCISE PROGRAM: Access Code: [email or web address] URL: [email or web address] Date: 12/23/2022 Prepared by: Clarene Essex  Exercises -  Prone Knee Flexion  - 1 x daily - 7 x weekly - 3 sets - 10 reps - Prone Hip Extension  - 1 x daily - 7 x weekly - 3 sets - 10 reps - Prone Press Up On Elbows  - 1 x daily - 7 x weekly - 3 sets - 10 reps - Supine Bridge with Resistance Band  - 1 x daily - 7 x weekly - 3 sets - 10 reps - Sidelying Hip Abduction  - 1 x daily - 7 x weekly - 3 sets - 10 reps  ASSESSMENT:  CLINICAL IMPRESSION: Pt tolerated the progression of exercises well. We progressed lumbopelvic strengthening to tolerance. Focused on extension based exercises to reduce radicular sx and pain. Good response to initial treatment.  OBJECTIVE IMPAIRMENTS: Abnormal gait, decreased endurance, decreased mobility, difficulty walking, decreased ROM, hypomobility, increased fascial restrictions, increased muscle spasms, impaired flexibility, postural dysfunction, and pain.   ACTIVITY LIMITATIONS: carrying, lifting, bending, sitting, standing, squatting, sleeping, stairs, transfers, and locomotion level  PARTICIPATION LIMITATIONS: cleaning, laundry, shopping, and community activity  PERSONAL FACTORS: Past/current experiences, Time since onset of injury/illness/exacerbation, and 1-2 comorbidities: severe degenerative disc disease, R THA, chronic low back pain  are also affecting patient's functional outcome.   REHAB POTENTIAL: Good  CLINICAL DECISION MAKING: Evolving/moderate complexity  EVALUATION COMPLEXITY: Moderate   GOALS: Goals reviewed with patient? Yes  SHORT TERM GOALS: Target date: 01/04/2023   Patient will be independent with initial HEP.  Baseline: given Goal status: INITIAL  2.  Patient will report centralization of radicular symptoms.  Baseline: radiates to R knee Goal status: INITIAL  LONG TERM GOALS: Target date: 02/01/2023    Patient will be independent with advanced/ongoing HEP to improve outcomes and carryover.  Baseline:  Goal status: INITIAL  2.  Patient will report 75% improvement in low back pain to  improve QOL.  Baseline:  Goal status: INITIAL  3.  Patient will demonstrate full pain free lumbar ROM to perform ADLs.   Baseline: see objective Goal status: INITIAL  4.  Patient will report 15% improvement on modified Oswestry to demonstrate improved functional ability.  Baseline: 62% disability  Goal status: INITIAL   5.  Patient will tolerate 30 min of walking to return to golf. Baseline: unable Goal status: INITIAL  PLAN:  PT FREQUENCY: 1-2x/week  PT DURATION: 6 weeks  PLANNED INTERVENTIONS: Therapeutic exercises, Therapeutic activity, Neuromuscular re-education, Balance training, Gait training, Patient/Family education, Self Care, Joint mobilization, Stair training, Dry Needling, Electrical stimulation, Spinal mobilization, Cryotherapy, Moist heat, Traction, Ultrasound, Manual therapy, and Re-evaluation.  PLAN FOR NEXT SESSION: review and progress HEP- neutral spine, extension if tolerated starting in prone, can try traction for radicular symptoms, dry needling/manual therapy, modalities PRN   Artist Pais, PTA 12/23/2022, 9:32 AM

## 2022-12-24 DIAGNOSIS — M25561 Pain in right knee: Secondary | ICD-10-CM | POA: Diagnosis not present

## 2022-12-24 DIAGNOSIS — G5732 Lesion of lateral popliteal nerve, left lower limb: Secondary | ICD-10-CM | POA: Diagnosis not present

## 2022-12-24 DIAGNOSIS — M47896 Other spondylosis, lumbar region: Secondary | ICD-10-CM | POA: Diagnosis not present

## 2022-12-24 DIAGNOSIS — R202 Paresthesia of skin: Secondary | ICD-10-CM | POA: Diagnosis not present

## 2022-12-24 DIAGNOSIS — M549 Dorsalgia, unspecified: Secondary | ICD-10-CM | POA: Diagnosis not present

## 2022-12-24 DIAGNOSIS — M79606 Pain in leg, unspecified: Secondary | ICD-10-CM | POA: Diagnosis not present

## 2022-12-24 DIAGNOSIS — M5136 Other intervertebral disc degeneration, lumbar region: Secondary | ICD-10-CM | POA: Diagnosis not present

## 2022-12-24 DIAGNOSIS — G609 Hereditary and idiopathic neuropathy, unspecified: Secondary | ICD-10-CM | POA: Diagnosis not present

## 2022-12-28 ENCOUNTER — Ambulatory Visit: Payer: Medicare (Managed Care)

## 2022-12-28 DIAGNOSIS — R252 Cramp and spasm: Secondary | ICD-10-CM

## 2022-12-28 DIAGNOSIS — G8929 Other chronic pain: Secondary | ICD-10-CM

## 2022-12-28 DIAGNOSIS — M5441 Lumbago with sciatica, right side: Secondary | ICD-10-CM | POA: Diagnosis not present

## 2022-12-28 NOTE — Therapy (Signed)
OUTPATIENT PHYSICAL THERAPY TREATMENT   Patient Name: Phillip Hughes MRN: [id number] DOB:05/28/1956, 67 y.o., male Today's Date: 12/28/2022  END OF SESSION:  PT End of Session - 12/28/22 0929     Visit Number 3    Number of Visits 12    Date for PT Re-Evaluation 02/01/23    Authorization Type cigna    PT Start Time [phone]    PT Stop Time 0928    PT Time Calculation (min) 41 min    Activity Tolerance Patient tolerated treatment well    Behavior During Therapy [facility] for tasks assessed/performed               Past Medical History:  Diagnosis Date   Hypertension    Sciatica    Sinusitis    Past Surgical History:  Procedure Laterality Date   BACK SURGERY     HERNIA REPAIR     HIP SURGERY     NECK SURGERY     Patient Active Problem List   Diagnosis Date Noted   Patellofemoral pain syndrome of right knee 12/01/2022   Lumbar radiculopathy 11/29/2022   Polyneuropathy 11/29/2022    PCP: Mackie Pai, PA-C  REFERRING PROVIDER: Rosemarie Ax, MD   REFERRING DIAG: M54.16 (ICD-10-CM) - Lumbar radiculopathy   Rationale for Evaluation and Treatment: Rehabilitation  THERAPY DIAG:  Chronic right-sided low back pain with right-sided sciatica  Cramp and spasm  ONSET DATE: April 2022  SUBJECTIVE:                                                                                                                                                                                           SUBJECTIVE STATEMENT: Pt reports he had some pain yesterday but thinks he wore a back brace too tight. He had nerve conduction test done, they found no issues with his nerves. He has been using a new supplement for joint inflammation named InstaFlex.  PERTINENT HISTORY:  R hip replacement, ACDF, L4-5 lumbar laminectomy 2017  PAIN:  Are you having pain? Yes: NPRS scale: 1/10 Pain location: right knee Pain description: sore Aggravating factors: walking, standing Relieving factors: pain  medication  PRECAUTIONS: None  WEIGHT BEARING RESTRICTIONS: No  FALLS:  Has patient fallen in last 6 months? Yes. Number of falls 1 -off ladder while trimming bushes, ladder was on grass and tipped. No falls due to loss of balance.   LIVING ENVIRONMENT: Lives with: lives with their spouse Lives in: House/apartment Stairs: Yes: Internal: 15 steps; on right going up and to basement, has trouble going up, sometimes goes out basement door and walks around house rather than go up  stairs.  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 Severe degenerative disc disease again seen at L2-3, with vacuum disc phenomenon and mild grade 1 degenerative retrolisthesis measuring approximately 4 mm. Lower lumbar facet DJD is seen bilaterally at L4-5 and L5-S1. No focal lytic or sclerotic bone lesions identified.   IMPRESSION: No acute findings.   Stable severe L2-3 degenerative disc disease and mild grade 1 degenerative retrolisthesis.   Lower lumbar facet DJD.  PATIENT SURVEYS:  Modified Oswestry 31/50 =62%   SCREENING FOR RED FLAGS: Bowel or bladder incontinence: No Spinal tumors: No Cauda equina syndrome: No Compression fracture: No Abdominal aneurysm: No  COGNITION: Overall cognitive status: Within functional limits for tasks assessed     SENSATION: WFL  MUSCLE LENGTH: Hamstrings: Right 60 deg; Left 60 deg   POSTURE: decreased lumbar lordosis  PALPATION: Tenderness R lumbar paraspinals, L2/L3.  Decreased mobility L3-5  LUMBAR ROM:   AROM eval  Flexion Almost to toes, increased knee pain  Extension Wnl,  increased pain  Right lateral flexion Past knee, tight  Left lateral flexion Past knee, tight  Right rotation WNL, increased radicular pain  Left rotation WNL increased LBP   (Blank rows = not tested)  LOWER EXTREMITY ROM:     Passive  Right eval Left eval  Hip flexion 70 70  Hip internal rotation 10 25  Hip external rotation 30 30   (Blank rows = not tested)  LOWER EXTREMITY MMT:    MMT Right eval Left eval  Hip flexion 5 5  Hip extension 5 5  Hip abduction 5 5  Hip adduction 5 5  Knee flexion 5 5  Knee extension 5 5  Ankle dorsiflexion 5 5   (Blank rows = not tested)  LUMBAR SPECIAL TESTS:  Straight leg raise test: Negative and Slump test: Negative  FUNCTIONAL TESTS:  5 times sit to stand: 16 seconds, increased LBP  GAIT: Distance walked: 50 Assistive device utilized: None Level of assistance: Complete Independence Comments: very antalgic  TODAY'S TREATMENT:                                                                                                                              DATE:   12/28/22 Therapeutic Exercise: to improve strength and mobility.  Demo, verbal and tactile cues throughout for technique. Rec Bike L2x89mn Bridge with blue TB 10x5" Bridge with hip ADD ball squeeze 10x5" S/L hip abduction x 10 R/L Superman x 10 Prone hip extension x 10 bil Prone quad stretch 3x30 sec Seated lat pulls 35# 2x10 - cues  to depress shoulders Standing shoulder ext with pulley 20# 2x10 - cues to depress shoulders  12/23/22 Therapeutic Exercise: to improve strength and mobility.  Demo, verbal and tactile cues throughout for technique. POE  Prone knee bends x 10 bil Prone hip extension x 10 bil Prone quad and hip flexor stretch with strap 2x30 sec - more tight on L side Bridges x 10 - progressed x 10 with GTB  S/L clamshells GTB R/L x 10  S/L hip abduction x 20 R/L Superman x 5  12/21/2022 Therapeutic Exercise: to improve strength and mobility.  Demo, verbal  and tactile cues throughout for technique. Prone knee bends x 10 bil  Prone leg extensions x 10 bil  Prone press-ups on elbows x 10    PATIENT EDUCATION:  Education details: HEP update  Person educated: Patient Education method: Explanation, Demonstration, Verbal cues, and Handouts Education comprehension: verbalized understanding and returned demonstration  HOME EXERCISE PROGRAM: Access Code: [email or web address] URL: [email or web address] Date: 12/23/2022 Prepared by: Clarene Essex  Exercises - Prone Knee Flexion  - 1 x daily - 7 x weekly - 3 sets - 10 reps - Prone Hip Extension  - 1 x daily - 7 x weekly - 3 sets - 10 reps - Prone Press Up On Elbows  - 1 x daily - 7 x weekly - 3 sets - 10 reps - Supine Bridge with Resistance Band  - 1 x daily - 7 x weekly - 3 sets - 10 reps - Sidelying Hip Abduction  - 1 x daily - 7 x weekly - 3 sets - 10 reps  ASSESSMENT:  CLINICAL IMPRESSION: Pt reported that he had a nerve study done this past Friday and they found no evidence of nerve tension or radiculopathy. He showed a good tolerance for the exercises today, although noted some fatigue in the back. He needed lots of cues with the lat pulls and shoulder ext to avoid shrugging shoulders.  OBJECTIVE IMPAIRMENTS: Abnormal gait, decreased endurance, decreased mobility, difficulty walking, decreased ROM, hypomobility, increased fascial restrictions, increased muscle spasms, impaired flexibility, postural dysfunction, and pain.   ACTIVITY LIMITATIONS: carrying, lifting, bending, sitting, standing, squatting, sleeping, stairs, transfers, and locomotion level  PARTICIPATION LIMITATIONS: cleaning, laundry, shopping, and community activity  PERSONAL FACTORS: Past/current experiences, Time since onset of injury/illness/exacerbation, and 1-2 comorbidities: severe degenerative disc disease, R THA, chronic low back pain  are also affecting patient's functional outcome.   REHAB POTENTIAL:  Good  CLINICAL DECISION MAKING: Evolving/moderate complexity  EVALUATION COMPLEXITY: Moderate   GOALS: Goals reviewed with patient? Yes  SHORT TERM GOALS: Target date: 01/04/2023   Patient will be independent with initial HEP.  Baseline: given Goal status: IN PROGRESS  2.  Patient will report centralization of radicular symptoms.  Baseline: radiates to R knee Goal status: IN PROGRESS  LONG TERM GOALS: Target date: 02/01/2023    Patient will be independent with advanced/ongoing HEP to improve outcomes and carryover.  Baseline:  Goal status: IN PROGRESS  2.  Patient will report 75% improvement in low back pain to improve QOL.  Baseline:  Goal status: IN PROGRESS  3.  Patient will demonstrate full pain free lumbar ROM to perform ADLs.   Baseline: see objective Goal status: IN PROGRESS  4.  Patient will report 15% improvement on modified Oswestry to demonstrate improved functional ability.  Baseline: 62% disability  Goal status: IN PROGRESS   5.  Patient will tolerate 30 min of walking to return to golf. Baseline: unable Goal status: IN PROGRESS  PLAN:  PT FREQUENCY: 1-2x/week  PT DURATION: 6 weeks  PLANNED INTERVENTIONS: Therapeutic exercises, Therapeutic activity, Neuromuscular re-education, Balance training, Gait training, Patient/Family education, Self Care, Joint mobilization, Stair training, Dry Needling, Electrical stimulation, Spinal mobilization, Cryotherapy, Moist heat, Traction, Ultrasound, Manual therapy, and Re-evaluation.  PLAN FOR NEXT SESSION: review and progress HEP- neutral spine, extension if tolerated starting in prone, can try traction for radicular symptoms, dry needling/manual therapy, modalities PRN   Artist Pais, PTA 12/28/2022, 9:29 AM

## 2022-12-30 ENCOUNTER — Encounter: Payer: Self-pay | Admitting: Physical Therapy

## 2022-12-30 ENCOUNTER — Ambulatory Visit: Payer: Medicare (Managed Care) | Admitting: Physical Therapy

## 2022-12-30 DIAGNOSIS — G8929 Other chronic pain: Secondary | ICD-10-CM

## 2022-12-30 DIAGNOSIS — M5441 Lumbago with sciatica, right side: Secondary | ICD-10-CM | POA: Diagnosis not present

## 2022-12-30 DIAGNOSIS — R252 Cramp and spasm: Secondary | ICD-10-CM

## 2022-12-30 NOTE — Therapy (Signed)
OUTPATIENT PHYSICAL THERAPY TREATMENT   Patient Name: Phillip Hughes MRN: [id number] DOB:1956-02-09, 67 y.o., male Today's Date: 12/30/2022  END OF SESSION:  PT End of Session - 12/30/22 0851     Visit Number 4    Number of Visits 12    Date for PT Re-Evaluation 02/01/23    Authorization Type cigna    PT Start Time [phone]    PT Stop Time 0937    PT Time Calculation (min) 48 min    Activity Tolerance Patient tolerated treatment well    Behavior During Therapy [facility] for tasks assessed/performed               Past Medical History:  Diagnosis Date   Hypertension    Sciatica    Sinusitis    Past Surgical History:  Procedure Laterality Date   [place]     NECK SURGERY     Patient Active Problem List   Diagnosis Date Noted   Patellofemoral pain syndrome of right knee 12/01/2022   Lumbar radiculopathy 11/29/2022   Polyneuropathy 11/29/2022    PCP: Mackie Pai, PA-C  REFERRING PROVIDER: Rosemarie Ax, MD   REFERRING DIAG: M54.16 (ICD-10-CM) - Lumbar radiculopathy   Rationale for Evaluation and Treatment: Rehabilitation  THERAPY DIAG:  Chronic right-sided low back pain with right-sided sciatica  Cramp and spasm  ONSET DATE: April 2022  SUBJECTIVE:                                                                                                                                                                                           SUBJECTIVE STATEMENT: Cold started the aching this morning  (in the teens).  Back hasn't been bothering him, but today hurting from knee down, nagging pain, gotten better as warmed up.    Starting to use bow flex again at home.   PERTINENT HISTORY:  R hip replacement, ACDF, L4-5 lumbar laminectomy 2017  PAIN:  Are you having pain? Yes: NPRS scale: 2/10 Pain location: right knee Pain description: naggging, and aching Aggravating factors: walking, standing Relieving factors: pain  medication  PRECAUTIONS: None  WEIGHT BEARING RESTRICTIONS: No  FALLS:  Has patient fallen in last 6 months? Yes. Number of falls 1 -off ladder while trimming bushes, ladder was on grass and tipped. No falls due to loss of balance.   LIVING ENVIRONMENT: Lives with: lives with their spouse Lives in: House/apartment Stairs: Yes: Internal: 15 steps; on right going up and to basement, has trouble going up, sometimes goes out basement door and walks around house rather than  go up stairs.  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 Severe degenerative disc disease again seen at L2-3, with vacuum disc phenomenon and mild grade 1 degenerative retrolisthesis measuring approximately 4 mm. Lower lumbar facet DJD is seen bilaterally at L4-5 and L5-S1. No focal lytic or sclerotic bone lesions identified.   IMPRESSION: No acute findings.   Stable severe L2-3 degenerative disc disease and mild grade 1 degenerative retrolisthesis.   Lower lumbar facet DJD.  PATIENT SURVEYS:  Modified Oswestry 31/50 =62%   SCREENING FOR RED FLAGS: Bowel or bladder incontinence: No Spinal tumors: No Cauda equina syndrome: No Compression fracture: No Abdominal aneurysm: No  COGNITION: Overall cognitive status: Within functional limits for tasks assessed     SENSATION: WFL  MUSCLE LENGTH: Hamstrings: Right 60 deg; Left 60 deg   POSTURE: decreased lumbar lordosis  PALPATION: Tenderness R lumbar paraspinals, L2/L3.  Decreased mobility L3-5  LUMBAR ROM:   AROM eval  Flexion Almost to toes, increased knee pain  Extension Wnl,  increased pain  Right lateral flexion Past knee, tight  Left lateral flexion Past knee, tight  Right rotation WNL, increased radicular pain  Left rotation WNL increased LBP   (Blank rows = not tested)  LOWER EXTREMITY ROM:     Passive  Right eval Left eval  Hip flexion 70 70  Hip internal rotation 10 25  Hip external rotation 30 30   (Blank rows = not tested)  LOWER EXTREMITY MMT:    MMT Right eval Left eval  Hip flexion 5 5  Hip extension 5 5  Hip abduction 5 5  Hip adduction 5 5  Knee flexion 5 5  Knee extension 5 5  Ankle dorsiflexion 5 5   (Blank rows = not tested)  LUMBAR SPECIAL TESTS:  Straight leg raise test: Negative and Slump test: Negative  FUNCTIONAL TESTS:  5 times sit to stand: 16 seconds, increased LBP  GAIT: Distance walked: 50 Assistive device utilized: None Level of assistance: Complete Independence Comments: very antalgic  TODAY'S TREATMENT:                                                                                                                              DATE:  12/30/2022 Therapeutic Exercise: to improve strength and mobility.  Demo, verbal and tactile cues throughout for technique. Rec Bike L2 x 6 min -reporting discomfort in R hip flexor Hip flexor/quad stretch stretch in prone 3 x 45 sec hold with strap bil Hamstring stretch with strap in supine 2 x 45 sec hold Single leg RDL with  UE support on counter x 10 bil  Chops blue TB x 10 bil  Reverse chops blue TB x 10 bil  Self Care: Added heel lift to left shoe, info where to purchase, to level hips.  Reported less pressure on R hip and knee.   12/28/22 Therapeutic Exercise: to improve strength and mobility.  Demo, verbal and tactile cues throughout for technique. Rec Bike L2x28mn Bridge with blue TB 10x5" Bridge with hip ADD ball squeeze 10x5" S/L hip abduction x 10 R/L Superman x 10 Prone hip extension x 10 bil Prone quad stretch 3x30 sec Seated lat pulls 35# 2x10 - cues to  depress shoulders Standing shoulder ext with pulley 20# 2x10 - cues to depress shoulders  12/23/22 Therapeutic Exercise: to improve strength and mobility.  Demo, verbal and tactile cues throughout for technique. POE  Prone knee bends x 10 bil Prone hip extension x 10 bil Prone quad and hip flexor stretch with strap 2x30 sec - more tight on L side Bridges x 10 - progressed x 10 with GTB  S/L clamshells GTB R/L x 10  S/L hip abduction x 20 R/L Superman x 5  12/21/2022 Therapeutic Exercise: to improve strength and mobility.  Demo, verbal and tactile cues throughout for technique. Prone knee bends x 10 bil  Prone leg extensions x 10 bil  Prone press-ups on elbows x 10    PATIENT EDUCATION:  Education details: HEP update  Person educated: Patient Education method: Explanation, Demonstration, Verbal cues, and Handouts Education comprehension: verbalized understanding and returned demonstration  HOME EXERCISE PROGRAM: Access Code: [email or web address] URL: [email or web address] Date: 12/23/2022 Prepared by: BClarene Essex Exercises - Prone Knee Flexion  - 1 x daily - 7 x weekly - 3 sets - 10 reps - Prone Hip Extension  - 1 x daily - 7 x weekly - 3 sets - 10 reps - Prone Press Up On Elbows  - 1 x daily - 7 x weekly - 3 sets - 10 reps - Supine Bridge with Resistance Band  - 1 x daily - 7 x weekly - 3 sets - 10 reps - Sidelying Hip Abduction  - 1 x daily - 7 x weekly - 3 sets - 10 reps  ASSESSMENT:  CLINICAL IMPRESSION: AHilbert Briggsreports good compliance with HEP meeting STG #1, he has also resumed using bowflex machine at home.  He continues to have mostly R knee pain, a little discomfort reported today in R hip flexor while on bike.  He has had imaging of his R knee without other than traction spur on articular surface of superior ose of patella was negative.   He does have leg length discrepancy with R hip sitting significantly higher than left, so added heel lift to left which  even up his hips and he reported less pressure in R hip and R knee with gait.   Also noted more tightness L hip flexor/quads compared to R, when he was in PT previously stretched R side only, so he is to start stretching both sides to correct muscle imbalance.  Also added exercises today to prepare for golf in [place] which is a goal of his.  AAsa Baudoincontinues to demonstrate potential for improvement and would benefit from continued skilled therapy to address impairments.     OBJECTIVE IMPAIRMENTS: Abnormal gait, decreased endurance, decreased mobility, difficulty walking, decreased ROM, hypomobility, increased fascial restrictions, increased muscle spasms, impaired flexibility, postural dysfunction, and pain.   ACTIVITY LIMITATIONS: carrying, lifting, bending, sitting, standing, squatting,  sleeping, stairs, transfers, and locomotion level  PARTICIPATION LIMITATIONS: cleaning, laundry, shopping, and community activity  PERSONAL FACTORS: Past/current experiences, Time since onset of injury/illness/exacerbation, and 1-2 comorbidities: severe degenerative disc disease, R THA, chronic low back pain  are also affecting patient's functional outcome.   REHAB POTENTIAL: Good  CLINICAL DECISION MAKING: Evolving/moderate complexity  EVALUATION COMPLEXITY: Moderate   GOALS: Goals reviewed with patient? Yes  SHORT TERM GOALS: Target date: 01/04/2023   Patient will be independent with initial HEP.  Baseline: given Goal status: MET 12/30/2022- good compliance.   2.  Patient will report centralization of radicular symptoms.  Baseline: radiates to R knee Goal status: IN PROGRESS  LONG TERM GOALS: Target date: 02/01/2023    Patient will be independent with advanced/ongoing HEP to improve outcomes and carryover.  Baseline:  Goal status: IN PROGRESS  2.  Patient will report 75% improvement in low back pain to improve QOL.  Baseline:  Goal status: IN PROGRESS  3.  Patient will demonstrate full  pain free lumbar ROM to perform ADLs.   Baseline: see objective Goal status: IN PROGRESS  4.  Patient will report 15% improvement on modified Oswestry to demonstrate improved functional ability.  Baseline: 62% disability  Goal status: IN PROGRESS   5.  Patient will tolerate 30 min of walking to return to golf. Baseline: unable Goal status: IN PROGRESS  PLAN:  PT FREQUENCY: 1-2x/week  PT DURATION: 6 weeks  PLANNED INTERVENTIONS: Therapeutic exercises, Therapeutic activity, Neuromuscular re-education, Balance training, Gait training, Patient/Family education, Self Care, Joint mobilization, Stair training, Dry Needling, Electrical stimulation, Spinal mobilization, Cryotherapy, Moist heat, Traction, Ultrasound, Manual therapy, and Re-evaluation.  PLAN FOR NEXT SESSION: review and progress HEP- neutral spine, extension if tolerated starting in prone, can try traction for radicular symptoms, dry needling/manual therapy, modalities PRN   Rennie Natter, PT, DPT  12/30/2022, 9:48 AM

## 2022-12-31 ENCOUNTER — Ambulatory Visit: Payer: Medicare (Managed Care) | Admitting: Family Medicine

## 2022-12-31 ENCOUNTER — Encounter: Payer: Self-pay | Admitting: Family Medicine

## 2022-12-31 VITALS — BP 132/84 | Ht 67.0 in | Wt 164.0 lb

## 2022-12-31 DIAGNOSIS — M222X1 Patellofemoral disorders, right knee: Secondary | ICD-10-CM | POA: Diagnosis not present

## 2022-12-31 DIAGNOSIS — G5732 Lesion of lateral popliteal nerve, left lower limb: Secondary | ICD-10-CM | POA: Diagnosis not present

## 2022-12-31 NOTE — Progress Notes (Signed)
  Dakwon Wenberg - 67 y.o. male MRN [id number]  Date of birth: 1956/11/22  SUBJECTIVE:  Including CC & ROS.  No chief complaint on file.   Prem Coykendall is a 67 y.o. male that is  following up for his feet pain, knee pain and EMG. His EMG showed a left knee peroneal mononeuropathy. This did not demonstrate peripheral polyneuropathy in the lower extremities.    Review of Systems See HPI   HISTORY: Past Medical, Surgical, Social, and Family History Reviewed & Updated per EMR.   Pertinent Historical Findings include:  Past Medical History:  Diagnosis Date   Hypertension    Sciatica    Sinusitis     Past Surgical History:  Procedure Laterality Date   BACK SURGERY     HERNIA REPAIR     HIP SURGERY     NECK SURGERY       PHYSICAL EXAM:  VS: BP 132/84   Ht '5\' 7"'$  (1.702 m)   Wt 164 lb (74.4 kg)   BMI 25.69 kg/m  Physical Exam Gen: NAD, alert, cooperative with exam, well-appearing MSK:  Neurovascularly intact       ASSESSMENT & PLAN:   Peroneal mononeuropathy, left The EMG only showed a left side neuropathy. His symptoms have improved with the leg length discrepancy.  - continue heel pad on left   Patellofemoral pain syndrome of right knee Pain has improved with correcting left length discrepancy and with physical therapy  - counseled on home exercise therapy and supportive care - continue PT  - could consider genicular nerve block

## 2022-12-31 NOTE — Assessment & Plan Note (Signed)
The EMG only showed a left side neuropathy. His symptoms have improved with the leg length discrepancy.  - continue heel pad on left

## 2022-12-31 NOTE — Assessment & Plan Note (Signed)
Pain has improved with correcting left length discrepancy and with physical therapy  - counseled on home exercise therapy and supportive care - continue PT  - could consider genicular nerve block

## 2023-01-05 ENCOUNTER — Ambulatory Visit: Payer: Medicare (Managed Care)

## 2023-01-05 DIAGNOSIS — G8929 Other chronic pain: Secondary | ICD-10-CM

## 2023-01-05 DIAGNOSIS — R252 Cramp and spasm: Secondary | ICD-10-CM

## 2023-01-05 DIAGNOSIS — M5441 Lumbago with sciatica, right side: Secondary | ICD-10-CM | POA: Diagnosis not present

## 2023-01-05 NOTE — Therapy (Signed)
OUTPATIENT PHYSICAL THERAPY TREATMENT   Patient Name: Phillip Hughes MRN: [id number] DOB:01-26-1956, 67 y.o., male Today's Date: 01/05/2023  END OF SESSION:  PT End of Session - 01/05/23 0934     Visit Number 5    Number of Visits 12    Date for PT Re-Evaluation 02/01/23    Authorization Type cigna    PT Start Time 0930    PT Stop Time 1024    PT Time Calculation (min) 54 min    Activity Tolerance Patient tolerated treatment well    Behavior During Therapy [facility] for tasks assessed/performed               Past Medical History:  Diagnosis Date   Hypertension    Sciatica    Sinusitis    Past Surgical History:  Procedure Laterality Date   BACK SURGERY     HERNIA REPAIR     HIP SURGERY     NECK SURGERY     Patient Active Problem List   Diagnosis Date Noted   Patellofemoral pain syndrome of right knee 12/01/2022   Lumbar radiculopathy 11/29/2022   Peroneal mononeuropathy, 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    PCP: Mackie Pai, PA-C  REFERRING PROVIDER: Rosemarie Ax, MD   REFERRING DIAG: M54.16 (ICD-10-CM) - Lumbar radiculopathy   Rationale for Evaluation and Treatment: Rehabilitation  THERAPY DIAG:  Chronic right-sided low back pain with right-sided sciatica  Cramp and spasm  ONSET DATE: April 2022  SUBJECTIVE:                                                                                                                                                                                           SUBJECTIVE STATEMENT: Pt reports having most difficulty with R knee pain. About 4 days ago he had a pain along the rectus femoris muscle. He notes that the insert from last session helped a lot with his   PERTINENT HISTORY:  R hip replacement, ACDF, L4-5 lumbar laminectomy 2017  PAIN:  Are you having pain? Yes: NPRS scale: 3/10 Pain location: right knee Pain description: aching Aggravating factors: walking, standing Relieving factors: pain medication  PRECAUTIONS:  None  WEIGHT BEARING RESTRICTIONS: No  FALLS:  Has patient fallen in last 6 months? Yes. Number of falls 1 -off ladder while trimming bushes, ladder was on grass and tipped. No falls due to loss of balance.   LIVING ENVIRONMENT: Lives with: lives with their spouse Lives in: House/apartment Stairs: Yes: Internal: 15 steps; on right going up and to basement, has trouble going up, sometimes goes out basement door and walks around house rather than go up stairs.  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 Severe degenerative disc disease again seen at L2-3, with vacuum disc phenomenon and mild grade 1 degenerative retrolisthesis measuring approximately 4 mm. Lower lumbar facet DJD is seen bilaterally at L4-5 and L5-S1. No focal lytic or sclerotic bone lesions identified.   IMPRESSION: No acute findings.   Stable severe L2-3 degenerative disc disease and mild grade 1 degenerative retrolisthesis.   Lower lumbar facet DJD.  PATIENT SURVEYS:  Modified Oswestry 31/50 =62%   SCREENING FOR RED FLAGS: Bowel or bladder incontinence: No Spinal tumors: No Cauda equina syndrome: No Compression fracture: No Abdominal aneurysm: No  COGNITION: Overall cognitive status: Within functional limits for tasks assessed     SENSATION: WFL  MUSCLE LENGTH: Hamstrings: Right 60 deg; Left 60 deg   POSTURE: decreased lumbar lordosis  PALPATION: Tenderness R lumbar paraspinals, L2/L3.  Decreased mobility L3-5  LUMBAR ROM:   AROM eval  Flexion Almost to toes, increased knee pain  Extension Wnl, increased pain  Right  lateral flexion Past knee, tight  Left lateral flexion Past knee, tight  Right rotation WNL, increased radicular pain  Left rotation WNL increased LBP   (Blank rows = not tested)  LOWER EXTREMITY ROM:     Passive  Right eval Left eval  Hip flexion 70 70  Hip internal rotation 10 25  Hip external rotation 30 30   (Blank rows = not tested)  LOWER EXTREMITY MMT:    MMT Right eval Left eval  Hip flexion 5 5  Hip extension 5 5  Hip abduction 5 5  Hip adduction 5 5  Knee flexion 5 5  Knee extension 5 5  Ankle dorsiflexion 5 5   (Blank rows = not tested)  LUMBAR SPECIAL TESTS:  Straight leg raise test: Negative and Slump test: Negative  FUNCTIONAL TESTS:  5 times sit to stand: 16 seconds, increased LBP  GAIT: Distance walked: 50 Assistive device utilized: None Level of assistance: Complete Independence Comments: very antalgic  TODAY'S TREATMENT:                                                                                                                              DATE:   01/05/23 Therapeutic Exercise: to improve strength and mobility.  Demo, verbal and tactile cues throughout for technique. Rec Bike L4 x 6 min  Seated heel slides x 10 GTB bil Knee flexion 2x10 35# Knee extension 2x10 25# Leg press 45# bil 2x10 Lateral step downs bil 6' 2x10  Manual Therapy: to decrease muscle spasm, pain and improve mobility.  STM to R patellar ligament Patellar mobilization all directions grade III-IV  12/30/2022 Therapeutic Exercise: to improve strength and mobility.  Demo, verbal and tactile cues throughout for technique. Rec Bike L2 x 6 min -reporting discomfort in R hip flexor Hip flexor/quad stretch stretch in prone 3 x 45 sec hold with strap bil Hamstring stretch with strap in supine 2 x 45 sec hold Single leg RDL with UE support on counter x 10 bil  Chops blue TB x 10 bil  Reverse chops blue TB x 10 bil  Self Care: Added heel lift to left shoe, info where to  purchase, to level hips.  Reported less pressure on R hip and knee.   12/28/22 Therapeutic Exercise: to improve strength and mobility.  Demo, verbal and tactile cues throughout for technique. Rec Bike L2x39mn Bridge with blue TB 10x5" Bridge with hip ADD ball squeeze 10x5" S/L hip abduction x 10 R/L Superman x 10 Prone hip extension x 10 bil Prone quad stretch 3x30 sec Seated lat pulls 35# 2x10 - cues to depress shoulders Standing shoulder ext with pulley 20# 2x10 - cues to depress shoulders  PATIENT EDUCATION:  Education details: HEP update  Person educated: Patient Education method: Explanation, Demonstration, Verbal cues, and Handouts Education comprehension: verbalized understanding and returned demonstration  HOME EXERCISE PROGRAM: Access Code: [email or web address] URL: [email or web address] Date: 01/05/2023 Prepared by: BClarene Essex Exercises - Prone Hip Extension  - 1 x daily - 7 x weekly - 3 sets - 10 reps - Prone Press Up On Elbows  - 1 x daily - 7 x weekly - 3 sets - 10 reps - Supine Bridge with Resistance Band  - 1 x daily - 7 x weekly - 3 sets - 10 reps - Sidelying Hip Abduction  - 1 x daily - 7 x weekly - 3 sets - 10 reps - Standing Diagonal Chop  - 1 x daily - 7 x weekly - 3 sets - 10 reps - Reverse Chop with Resistance  - 1 x daily - 7 x weekly - 3 sets - 10 reps - Forward T with Counter Support  - 1 x daily - 7 x weekly - 3 sets - 10 reps - Prone Quadriceps Stretch with Strap  - 1 x daily - 7 x weekly - 1 sets - 3 reps - 45 sec - 1 min hold - Seated Knee Flexion with Anchored Resistance  - 1 x daily - 7 x weekly - 2-3 sets - 10 reps - Seated Knee Extension with Resistance  - 1 x daily - 7 x weekly - 2-3 sets - 10 reps - Lateral Step Down  - 1 x daily - 7 x weekly - 2-3 sets - 10 reps  ASSESSMENT:  CLINICAL IMPRESSION: Advanced patient through [employer] to improve knee strength and mobility to improve functional mobility as this was his main complaint. He  reports 75% improvement in LBP --- LTG #2 met. He declines any radicular pain also meeting STG #2. Overall he tolerated the interventions well, although hade some pain with leg press but with modification this subsided. Updated HEP, providing instruction and demonstration for understanding.   OBJECTIVE IMPAIRMENTS: Abnormal gait, decreased endurance, decreased mobility, difficulty walking, decreased ROM, hypomobility, increased fascial restrictions, increased muscle spasms, impaired flexibility, postural dysfunction, and pain.   ACTIVITY LIMITATIONS: carrying, lifting, bending, sitting, standing, squatting, sleeping, stairs, transfers, and locomotion level  PARTICIPATION LIMITATIONS: cleaning, laundry, shopping, and community activity  PERSONAL FACTORS: Past/current experiences, Time since onset of  injury/illness/exacerbation, and 1-2 comorbidities: severe degenerative disc disease, R THA, chronic low back pain  are also affecting patient's functional outcome.   REHAB POTENTIAL: Good  CLINICAL DECISION MAKING: Evolving/moderate complexity  EVALUATION COMPLEXITY: Moderate   GOALS: Goals reviewed with patient? Yes  SHORT TERM GOALS: Target date: 01/04/2023   Patient will be independent with initial HEP.  Baseline: given Goal status: MET 12/30/2022- good compliance.   2.  Patient will report centralization of radicular symptoms.  Baseline: radiates to R knee Goal status: MET- 01/05/23  LONG TERM GOALS: Target date: 02/01/2023    Patient will be independent with advanced/ongoing HEP to improve outcomes and carryover.  Baseline:  Goal status: IN PROGRESS  2.  Patient will report 75% improvement in low back pain to improve QOL.  Baseline:  Goal status: MET- 01/05/23 75% improvement  3.  Patient will demonstrate full pain free lumbar ROM to perform ADLs.   Baseline: see objective Goal status: IN PROGRESS  4.  Patient will report 15% improvement on modified Oswestry to demonstrate  improved functional ability.  Baseline: 62% disability  Goal status: IN PROGRESS   5.  Patient will tolerate 30 min of walking to return to golf. Baseline: unable Goal status: IN PROGRESS  PLAN:  PT FREQUENCY: 1-2x/week  PT DURATION: 6 weeks  PLANNED INTERVENTIONS: Therapeutic exercises, Therapeutic activity, Neuromuscular re-education, Balance training, Gait training, Patient/Family education, Self Care, Joint mobilization, Stair training, Dry Needling, Electrical stimulation, Spinal mobilization, Cryotherapy, Moist heat, Traction, Ultrasound, Manual therapy, and Re-evaluation.  PLAN FOR NEXT SESSION: review and progress HEP- neutral spine, extension if tolerated starting in prone, can try traction for radicular symptoms, dry needling/manual therapy, modalities PRN   Artist Pais, PTA 01/05/2023, 10:36 AM

## 2023-01-06 ENCOUNTER — Encounter: Payer: Self-pay | Admitting: Medical

## 2023-01-06 ENCOUNTER — Other Ambulatory Visit (HOSPITAL_BASED_OUTPATIENT_CLINIC_OR_DEPARTMENT_OTHER): Payer: Self-pay

## 2023-01-06 ENCOUNTER — Ambulatory Visit (INDEPENDENT_AMBULATORY_CARE_PROVIDER_SITE_OTHER): Payer: Medicare (Managed Care) | Admitting: Medical

## 2023-01-06 ENCOUNTER — Ambulatory Visit (HOSPITAL_BASED_OUTPATIENT_CLINIC_OR_DEPARTMENT_OTHER)
Admission: RE | Admit: 2023-01-06 | Discharge: 2023-01-06 | Disposition: A | Discharge status: Home / Self Care | Payer: Medicare (Managed Care) | Source: Ambulatory Visit | Attending: Medical | Admitting: Medical

## 2023-01-06 VITALS — BP 139/89 | HR 87 | Temp 98.0°F | Resp 18 | Ht 67.0 in | Wt 167.4 lb

## 2023-01-06 DIAGNOSIS — M25561 Pain in right knee: Secondary | ICD-10-CM

## 2023-01-06 LAB — CBC WITH DIFFERENTIAL/PLATELET
Basophils Absolute: 0 10*3/uL (ref 0.0–0.1)
Basophils Relative: 0.6 % (ref 0.0–3.0)
Eosinophils Absolute: 0.1 10*3/uL (ref 0.0–0.7)
Eosinophils Relative: 2.1 % (ref 0.0–5.0)
HCT: 37.7 % — ABNORMAL LOW (ref 39.0–52.0)
Hemoglobin: 12.3 g/dL — ABNORMAL LOW (ref 13.0–17.0)
Lymphocytes Relative: 43.9 % (ref 12.0–46.0)
Lymphs Abs: 1.9 10*3/uL (ref 0.7–4.0)
MCHC: 32.6 g/dL (ref 30.0–36.0)
MCV: 88.1 fl (ref 78.0–100.0)
Monocytes Absolute: 0.7 10*3/uL (ref 0.1–1.0)
Monocytes Relative: 17.6 % — ABNORMAL HIGH (ref 3.0–12.0)
Neutro Abs: 1.5 10*3/uL (ref 1.4–7.7)
Neutrophils Relative %: 35.8 % — ABNORMAL LOW (ref 43.0–77.0)
Platelets: 288 10*3/uL (ref 150.0–400.0)
RBC: 4.28 Mil/uL (ref 4.22–5.81)
RDW: 12.7 % (ref 11.5–15.5)
WBC: 4.2 10*3/uL (ref 4.0–10.5)

## 2023-01-06 LAB — URIC ACID: Uric Acid, Serum: 4 mg/dL (ref 4.0–7.8)

## 2023-01-06 MED ORDER — HYDROCODONE-ACETAMINOPHEN 5-325 MG PO TABS
1.0000 | ORAL_TABLET | Freq: Four times a day (QID) | ORAL | 0 refills | Status: DC | PRN
  Filled 2023-01-06: qty 6, 2d supply, fill #0

## 2023-01-06 NOTE — Patient Instructions (Addendum)
Hx of patello femoral pain syndrome on review of sports med note. Today with pain you have faint warmth. Will get cbc and uric acid today to evaluate other differential dx.  Continue celebrex and will give limited number of norco for short term until labs come back. May give gout med or switch to medrol taper.  Also after review of lab and xray of knee  may need to get you back in with sports med.  Follow up date with me in one week. If determined need sports med then would not be necessary to follow up with me.

## 2023-01-06 NOTE — Progress Notes (Signed)
Subjective:    Patient ID: Phillip Hughes, male    DOB: 05/24/1956, 67 y.o.   MRN: [id number]  HPI  Pt in with rt knee pain for about one week. He notes knee pain came on around time very cold weather started. Pt has severe pain despite using celebrex. He state level 9/10 pain.  Pt did have some sciatica  and knee pain in the past. Pt went to sports med and then went to PT. Pt states treatment helped until last week.    Htn- on recheck bp is better. He states his is still on hctz 25 mg daily.    Review of Systems  Constitutional:  Negative for chills, fatigue and fever.  Respiratory:  Negative for cough, chest tightness, shortness of breath and wheezing.   Cardiovascular:  Negative for chest pain and palpitations.  Gastrointestinal:  Negative for abdominal pain.  Musculoskeletal:  Negative for back pain.       Rt knee pain.    Past Medical History:  Diagnosis Date   Hypertension    Sciatica    Sinusitis      Social History   Socioeconomic History   Marital status: Married    Spouse name: Not on file   Number of children: Not on file   Years of education: Not on file   Highest education level: Not on file  Occupational History   Not on file  Tobacco Use   Smoking status: Former    Packs/day: 1.00    Years: 28.00    Total pack years: 28.00    Types: Cigarettes   Smokeless tobacco: Never  Vaping Use   Vaping Use: Never used  Substance and Sexual Activity   Alcohol use: Yes    Comment: rarely   Drug use: No   Sexual activity: Not on file  Other Topics Concern   Not on file  Social History Narrative   Not on file   Social Determinants of Health   Financial Resource Strain: Not on file  Food Insecurity: Not on file  Transportation Needs: Not on file  Physical Activity: Not on file  Stress: Not on file  Social Connections: Not on file  Intimate Partner Violence: Not on file    Past Surgical History:  Procedure Laterality Date   BACK [place]      No family history on file.  Allergies  Allergen Reactions   Gabapentin Other (See Comments)    Mood Swings and Depression Mood Swings and Depression Mood Swings and Depression Mood Swings and Depression    Statins Other (See Comments)    Current Outpatient Medications on File Prior to Visit  Medication Sig Dispense Refill   atorvastatin (LIPITOR) 20 MG tablet Take 1 tablet by mouth daily.     celecoxib (CELEBREX) 200 MG capsule Take 1 capsule (200 mg total) by mouth 2 (two) times daily as needed. 60 capsule 2   diclofenac Sodium (VOLTAREN) 1 % GEL Apply 2 g topically 4 (four) times daily as needed. 100 g 0   hydrochlorothiazide (HYDRODIURIL) 25 MG tablet Take 1 tablet (25 mg total) by mouth daily. 30 tablet 0   HYDROcodone-acetaminophen (NORCO) 5-325 MG tablet Take 1 tablet by mouth every 6 (six) hours as needed for moderate pain or severe pain. 20 tablet 0   predniSONE (DELTASONE) 20 MG tablet Take 2 tablets (40 mg total) by mouth  daily. 10 tablet 0   No current facility-administered medications on file prior to visit.    BP 139/89   Pulse 87   Temp 98 F (36.7 C)   Resp 18   Ht '5\' 7"'$  (1.702 m)   Wt 167 lb 6.4 oz (75.9 kg)   SpO2 100%   BMI 26.22 kg/m        Objective:   Physical Exam  General- No acute distress. Pleasant patient. Neck- Full range of motion, no jvd Lungs- Clear, even and unlabored. Heart- regular rate and rhythm. Neurologic- CNII- XII grossly intact.  Rt knee- pain on flexion and extension. Faint warmth to palpation. Pain descripted in patella tendon region.   Rt lower ext/calf. Not swollen and negative homans sign.    Assessment & Plan:   Patient Instructions  Hx of patello femoral pain syndrome on review of sports med note. Today with pain you have faint warmth. Will get cbc and uric acid today to evaluate other differential dx.  Continue celebrex and will give limited number of norco  for short term until labs come back. May give gout med or switch to medrol taper.  Also after review of lab and xray of knee  may need to get you back in with sports med.  Follow up date with me in one week. If determined need sports med then would not be necessary to follow up with me.      Mackie Pai, PA-C

## 2023-01-10 NOTE — Therapy (Signed)
OUTPATIENT PHYSICAL THERAPY TREATMENT   Patient Name: Phillip Hughes MRN: [id number] DOB:05-18-1956, 67 y.o., male Today's Date: 01/11/2023  END OF SESSION:  PT End of Session - 01/11/23 0933     Visit Number 6    Number of Visits 12    Date for PT Re-Evaluation 02/01/23    Authorization Type cigna    PT Start Time [phone]    PT Stop Time 0933    PT Time Calculation (min) 44 min    Activity Tolerance Patient tolerated treatment well    Behavior During Therapy [facility] for tasks assessed/performed               Past Medical History:  Diagnosis Date   Hypertension    Sciatica    Sinusitis    Past Surgical History:  Procedure Laterality Date   [place]     NECK SURGERY     Patient Active Problem List   Diagnosis Date Noted   Patellofemoral pain syndrome of right knee 12/01/2022   Lumbar radiculopathy 11/29/2022   Peroneal mononeuropathy, 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    PCP: Mackie Pai, PA-C  REFERRING PROVIDER: Rosemarie Ax, MD   REFERRING DIAG: M54.16 (ICD-10-CM) - Lumbar radiculopathy   Rationale for Evaluation and Treatment: Rehabilitation  THERAPY DIAG:  Chronic right-sided low back pain with right-sided sciatica  Cramp and spasm  ONSET DATE: April 2022  SUBJECTIVE:                                                                                                                                                                                           SUBJECTIVE STATEMENT: Patient reports had to see his primary on Friday because pain was shooting down his R leg on Thursday.  Hydrocodone prescribed which helped. Last night he felt it again down R thigh medially and laterally and took Aleve arthritis and that helped.  PERTINENT HISTORY:  R hip replacement, ACDF, L4-5 lumbar laminectomy 2017  PAIN:  Are you having pain? Yes: NPRS scale: 0/10 Pain location: right knee Pain description: aching Aggravating factors: walking,  standing Relieving factors: pain medication  PRECAUTIONS: None  WEIGHT BEARING RESTRICTIONS: No  FALLS:  Has patient fallen in last 6 months? Yes. Number of falls 1 -off ladder while trimming bushes, ladder was on grass and tipped. No falls due to loss of balance.   LIVING ENVIRONMENT: Lives with: lives with their spouse Lives in: House/apartment Stairs: Yes: Internal: 15 steps; on right going up and to basement, has trouble going up, sometimes goes out basement door and walks around house  rather than go up stairs.  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 Severe degenerative disc disease again seen at L2-3, with vacuum disc phenomenon and mild grade 1 degenerative retrolisthesis measuring approximately 4 mm. Lower lumbar facet DJD is seen bilaterally at L4-5 and L5-S1. No focal lytic or sclerotic bone lesions identified.   IMPRESSION: No acute findings.   Stable severe L2-3 degenerative disc disease and mild grade 1 degenerative retrolisthesis.   Lower lumbar facet DJD.  PATIENT SURVEYS:  Modified Oswestry 31/50 =62%   SCREENING FOR RED FLAGS: Bowel or bladder incontinence: No Spinal tumors: No Cauda equina syndrome: No Compression fracture: No Abdominal aneurysm: No  COGNITION: Overall cognitive status: Within functional limits for tasks assessed     SENSATION: WFL  MUSCLE LENGTH: Hamstrings: Right 60 deg; Left 60 deg   POSTURE: decreased lumbar lordosis  PALPATION: Tenderness R lumbar paraspinals, L2/L3.  Decreased mobility L3-5  LUMBAR ROM:   AROM eval  Flexion Almost to toes,  increased knee pain  Extension Wnl, increased pain  Right lateral flexion Past knee, tight  Left lateral flexion Past knee, tight  Right rotation WNL, increased radicular pain  Left rotation WNL increased LBP   (Blank rows = not tested)  LOWER EXTREMITY ROM:     Passive  Right eval Left eval  Hip flexion 70 70  Hip internal rotation 10 25  Hip external rotation 30 30   (Blank rows = not tested)  LOWER EXTREMITY MMT:    MMT Right eval Left eval  Hip flexion 5 5  Hip extension 5 5  Hip abduction 5 5  Hip adduction 5 5  Knee flexion 5 5  Knee extension 5 5  Ankle dorsiflexion 5 5   (Blank rows = not tested)  LUMBAR SPECIAL TESTS:  Straight leg raise test: Negative and Slump test: Negative  FUNCTIONAL TESTS:  5 times sit to stand: 16 seconds, increased LBP  GAIT: Distance walked: 50 Assistive device utilized: None Level of assistance: Complete Independence Comments: very antalgic  TODAY'S TREATMENT:                                                                                                                              DATE:   01/11/23 Therapeutic Exercise: to improve strength and mobility.  Demo, verbal and tactile cues throughout for technique. Rec Bike L4 x 6 min  Standing lumbar ext 5 x 5 sec hold Single leg RDL with UE support on counter x 5 bil (difficulty when standing on L LE - R hip opens up and  does not go to parallel); Then tried to table with no UE support x 5 each (poor stability bil) Prone hip extension alternating x 10 ea Attempted superman but painful in low back so changed to alt arm/leg x 5 ea 5 sec hold with pillow under waist Hip flexor/quad stretch stretch in prone 1 x 60 sec hold with strap bil Chops blue TB x 10 bil  Reverse chops blue TB x 10 bil  Dead lift focusing on hip hinge form using 8# wt initially and then no weight due to difficulty maintaining upper back in neutral x 10 reps. Functional squat position with OH shoulder flexion x  10 sec hold x 2   01/05/23 Therapeutic Exercise: to improve strength and mobility.  Demo, verbal and tactile cues throughout for technique. Rec Bike L4 x 6 min  Seated heel slides x 10 GTB bil Knee flexion 2x10 35# Knee extension 2x10 25# Leg press 45# bil 2x10 Lateral step downs bil 6' 2x10  Manual Therapy: to decrease muscle spasm, pain and improve mobility.  STM to R patellar ligament Patellar mobilization all directions grade III-IV   12/30/2022 Therapeutic Exercise: to improve strength and mobility.  Demo, verbal and tactile cues throughout for technique. Rec Bike L2 x 6 min -reporting discomfort in R hip flexor Hip flexor/quad stretch stretch in prone 3 x 45 sec hold with strap bil Hamstring stretch with strap in supine 2 x 45 sec hold Single leg RDL with UE support on counter x 10 bil  Chops blue TB x 10 bil  Reverse chops blue TB x 10 bil  Self Care: Added heel lift to left shoe, info where to purchase, to level hips.  Reported less pressure on R hip and knee.    PATIENT EDUCATION:  Education details: HEP update  Person educated: Patient Education method: Explanation, Demonstration, Verbal cues, and Handouts Education comprehension: verbalized understanding and returned demonstration  HOME EXERCISE PROGRAM: Access Code: [email or web address] URL: [email or web address] Date: 01/05/2023 Prepared by: Phillip Hughes  Exercises - Prone Hip Extension  - 1 x daily - 7 x weekly - 3 sets - 10 reps - Prone Press Up On Elbows  - 1 x daily - 7 x weekly - 3 sets - 10 reps - Supine Bridge with Resistance Band  - 1 x daily - 7 x weekly - 3 sets - 10 reps - Sidelying Hip Abduction  - 1 x daily - 7 x weekly - 3 sets - 10 reps - Standing Diagonal Chop  - 1 x daily - 7 x weekly - 3 sets - 10 reps - Reverse Chop with Resistance  - 1 x daily - 7 x weekly - 3 sets - 10 reps - Forward T with Counter Support  - 1 x daily - 7 x weekly - 3 sets - 10 reps - Prone Quadriceps Stretch with  Strap  - 1 x daily - 7 x weekly - 1 sets - 3 reps - 45 sec - 1 min hold - Seated Knee Flexion with Anchored Resistance  - 1 x daily - 7 x weekly - 2-3 sets - 10 reps - Seated Knee Extension with Resistance  - 1 x daily - 7 x weekly - 2-3 sets - 10 reps - Lateral Step Down  - 1 x daily - 7 x weekly - 2-3 sets - 10 reps  ASSESSMENT:  CLINICAL IMPRESSION: Phillip Hughes reports he had a flare up since last Friday and had to see MD. He reports no pain  today and was able to tolerated TE with no radicular sx. He demonstrates poor core stability with RDLs and deadlifts and would benefit from ongoing strengthening here.   OBJECTIVE IMPAIRMENTS: Abnormal gait, decreased endurance, decreased mobility, difficulty walking, decreased ROM, hypomobility, increased fascial restrictions, increased muscle spasms, impaired flexibility, postural dysfunction, and pain.   ACTIVITY LIMITATIONS: carrying, lifting, bending, sitting, standing, squatting, sleeping, stairs, transfers, and locomotion level  PARTICIPATION LIMITATIONS: cleaning, laundry, shopping, and community activity  PERSONAL FACTORS: Past/current experiences, Time since onset of injury/illness/exacerbation, and 1-2 comorbidities: severe degenerative disc disease, R THA, chronic low back pain  are also affecting patient's functional outcome.   REHAB POTENTIAL: Good  CLINICAL DECISION MAKING: Evolving/moderate complexity  EVALUATION COMPLEXITY: Moderate   GOALS: Goals reviewed with patient? Yes  SHORT TERM GOALS: Target date: 01/04/2023   Patient will be independent with initial HEP.  Baseline: given Goal status: MET 12/30/2022- good compliance.   2.  Patient will report centralization of radicular symptoms.  Baseline: radiates to R knee Goal status: MET- 01/05/23  LONG TERM GOALS: Target date: 02/01/2023    Patient will be independent with advanced/ongoing HEP to improve outcomes and carryover.  Baseline:  Goal status: IN PROGRESS  2.  Patient  will report 75% improvement in low back pain to improve QOL.  Baseline:  Goal status: MET- 01/05/23 75% improvement  3.  Patient will demonstrate full pain free lumbar ROM to perform ADLs.   Baseline: see objective Goal status: IN PROGRESS  4.  Patient will report 15% improvement on modified Oswestry to demonstrate improved functional ability.  Baseline: 62% disability  Goal status: IN PROGRESS   5.  Patient will tolerate 30 min of walking to return to golf. Baseline: unable Goal status: IN PROGRESS  PLAN:  PT FREQUENCY: 1-2x/week  PT DURATION: 6 weeks  PLANNED INTERVENTIONS: Therapeutic exercises, Therapeutic activity, Neuromuscular re-education, Balance training, Gait training, Patient/Family education, Self Care, Joint mobilization, Stair training, Dry Needling, Electrical stimulation, Spinal mobilization, Cryotherapy, Moist heat, Traction, Ultrasound, Manual therapy, and Re-evaluation.  PLAN FOR NEXT SESSION: review and progress HEP- neutral spine, extension if tolerated starting in prone, can try traction for radicular symptoms, dry needling/manual therapy, modalities PRN   Pahola Dimmitt, PT 01/11/2023, 11:42 AM

## 2023-01-11 ENCOUNTER — Ambulatory Visit: Payer: Medicare (Managed Care) | Admitting: Physical Therapy

## 2023-01-11 ENCOUNTER — Encounter: Payer: Self-pay | Admitting: Physical Therapy

## 2023-01-11 DIAGNOSIS — M5441 Lumbago with sciatica, right side: Secondary | ICD-10-CM | POA: Diagnosis not present

## 2023-01-11 DIAGNOSIS — G8929 Other chronic pain: Secondary | ICD-10-CM

## 2023-01-11 DIAGNOSIS — R252 Cramp and spasm: Secondary | ICD-10-CM

## 2023-01-12 NOTE — Therapy (Signed)
OUTPATIENT PHYSICAL THERAPY TREATMENT   Patient Name: Phillip Hughes MRN: [id number] DOB:[birth year], 67 y.o., male Today's Date: 01/13/2023  END OF SESSION:  PT End of Session - 01/13/23 0910     Visit Number 7    Number of Visits 12    Date for PT Re-Evaluation 02/01/23    Authorization Type cigna    PT Start Time 0911    PT Stop Time 0956    PT Time Calculation (min) 45 min    Activity Tolerance Patient tolerated treatment well    Behavior During Therapy [facility] for tasks assessed/performed               Past Medical History:  Diagnosis Date   Hypertension    Sciatica    Sinusitis    Past Surgical History:  Procedure Laterality Date   BACK SURGERY     HERNIA REPAIR     HIP SURGERY     NECK SURGERY     Patient Active Problem List   Diagnosis Date Noted   Patellofemoral pain syndrome of right knee 12/01/2022   Lumbar radiculopathy 11/29/2022   Peroneal mononeuropathy, 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    PCP: Mackie Pai, PA-C  REFERRING PROVIDER: Rosemarie Ax, MD   REFERRING DIAG: M54.16 (ICD-10-CM) - Lumbar radiculopathy   Rationale for Evaluation and Treatment: Rehabilitation  THERAPY DIAG:  Chronic right-sided low back pain with right-sided sciatica  Cramp and spasm  ONSET DATE: April 2022  SUBJECTIVE:                                                                                                                                                                                           SUBJECTIVE STATEMENT: Patient felt the pain down right leg to knee yesterday. Aleve arthritis helped again. Positioning did not.Just soreness in R knee today.  PERTINENT HISTORY:  R hip replacement, ACDF, L4-5 lumbar laminectomy 2017  PAIN:  Are you having pain? Yes: NPRS scale: 0/10 Pain location: right knee Pain description: aching Aggravating factors: walking, standing Relieving factors: pain medication  PRECAUTIONS: None  WEIGHT BEARING RESTRICTIONS: No  FALLS:   Has patient fallen in last 6 months? Yes. Number of falls 1 -off ladder while trimming bushes, ladder was on grass and tipped. No falls due to loss of balance.   LIVING ENVIRONMENT: Lives with: lives with their spouse Lives in: House/apartment Stairs: Yes: Internal: 15 steps; on right going up and to basement, has trouble going up, sometimes goes out basement door and walks around house rather than go up stairs.  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 Severe degenerative disc disease again seen at L2-3, with vacuum disc phenomenon and mild grade 1 degenerative retrolisthesis measuring approximately 4 mm. Lower lumbar facet DJD is seen bilaterally at L4-5 and L5-S1. No focal lytic or sclerotic bone lesions identified.   IMPRESSION: No acute findings.   Stable severe L2-3 degenerative disc disease and mild grade 1 degenerative retrolisthesis.   Lower lumbar facet DJD.  PATIENT SURVEYS:  Modified Oswestry 31/50 =62%   SCREENING FOR RED FLAGS: Bowel or bladder incontinence: No Spinal tumors: No Cauda equina syndrome: No Compression fracture: No Abdominal aneurysm: No  COGNITION: Overall cognitive status: Within functional limits for tasks assessed     SENSATION: WFL  MUSCLE LENGTH: Hamstrings: Right 60 deg; Left 60 deg   POSTURE: decreased lumbar lordosis  PALPATION: Tenderness R lumbar paraspinals, L2/L3.  Decreased mobility L3-5  LUMBAR ROM:   AROM eval  Flexion Almost to toes, increased knee pain  Extension Wnl, increased pain  Right lateral flexion Past knee, tight  Left lateral flexion  Past knee, tight  Right rotation WNL, increased radicular pain  Left rotation WNL increased LBP   (Blank rows = not tested)  LOWER EXTREMITY ROM:     Passive  Right eval Left eval  Hip flexion 70 70  Hip internal rotation 10 25  Hip external rotation 30 30   (Blank rows = not tested)  LOWER EXTREMITY MMT:    MMT Right eval Left eval  Hip flexion 5 5  Hip extension 5 5  Hip abduction 5 5  Hip adduction 5 5  Knee flexion 5 5  Knee extension 5 5  Ankle dorsiflexion 5 5   (Blank rows = not tested)  LUMBAR SPECIAL TESTS:  Straight leg raise test: Negative and Slump test: Negative  FUNCTIONAL TESTS:  5 times sit to stand: 16 seconds, increased LBP  GAIT: Distance walked: 50 Assistive device utilized: None Level of assistance: Complete Independence Comments: very antalgic  TODAY'S TREATMENT:                                                                                                                              DATE:   01/13/23 Therapeutic Exercise: to improve strength and mobility.  Demo, verbal and tactile cues throughout for technique. Rec Bike L4 x 6 min  Lumbar tracton at sink x 20 sec  Functional squat position with OH shoulder flexion x 10 sec hold to bil shoulder ext x 2 Seated modified fig 4 with foot on stool 3x30 sec  Manual Therapy: to decrease muscle spasm and pain and improve mobility STM to R gluteals, piriformis  and lateral quads; MFR to R ITB Skilled palpation and monitoring of soft tissues during DN Trigger Point Dry-Needling  Treatment instructions: Expect mild to moderate muscle soreness. S/S of pneumothorax if dry needled over a lung field, and to seek immediate medical attention should they occur. Patient verbalized understanding of these instructions and education. Patient Consent Given: Yes Education handout provided: Yes Muscles treated: R glute min, med, piriformis, VLO, VM, RF Electrical stimulation performed: No Parameters:  N/A Treatment response/outcome: Twitch Response Elicited and Palpable Increase in Muscle Length     01/11/23 Therapeutic Exercise: to improve strength and mobility.  Demo, verbal and tactile cues throughout for technique. Rec Bike L4 x 6 min  Standing lumbar ext 5 x 5 sec hold Single leg RDL with UE support on counter x 5 bil (difficulty when standing on L LE - R hip opens up and does not go to parallel); Then tried to table with no UE support x 5 each (poor stability bil) Prone hip extension alternating x 10 ea Attempted superman but painful in low back so changed to alt arm/leg x 5 ea 5 sec hold with pillow under waist Hip flexor/quad stretch stretch in prone 1 x 60 sec hold with strap bil Chops blue TB x 10 bil  Reverse chops blue TB x 10 bil  Dead lift focusing on hip hinge form using 8# wt initially and then no weight due to difficulty maintaining upper back in neutral x 10 reps. Functional squat position with OH shoulder flexion x 10 sec hold x 2  01/05/23 Therapeutic Exercise: to improve strength and mobility.  Demo, verbal and tactile cues throughout for technique. Rec Bike L4 x 6 min  Seated heel slides x 10 GTB bil Knee flexion 2x10 35# Knee extension 2x10 25# Leg press 45# bil 2x10 Lateral step downs bil 6' 2x10  Manual Therapy: to decrease muscle spasm, pain and improve mobility.  STM to R patellar ligament Patellar mobilization all directions grade III-IV   12/30/2022 Therapeutic Exercise: to improve strength and mobility.  Demo, verbal and tactile cues throughout for technique. Rec Bike L2 x 6 min -reporting discomfort in R hip flexor Hip flexor/quad stretch stretch in prone 3 x 45 sec hold with strap bil Hamstring stretch with strap in supine 2 x 45 sec hold Single leg RDL with UE support on counter x 10 bil  Chops blue TB x 10 bil  Reverse chops blue TB x 10 bil  Self Care: Added heel lift to left shoe, info where to purchase, to level hips.  Reported less  pressure on R hip and knee.    PATIENT EDUCATION:  Education details: HEP update  Person educated: Patient Education method: Explanation, Demonstration, Verbal cues, and Handouts Education comprehension: verbalized understanding and returned demonstration  HOME EXERCISE PROGRAM: Access Code: [email or web address] URL: [email or web address] Date: 01/05/2023 Prepared by: Clarene Essex  Exercises - Prone Hip Extension  - 1 x daily - 7 x weekly - 3 sets - 10 reps - Prone Press Up On Elbows  - 1 x daily - 7 x weekly - 3 sets - 10 reps - Supine Bridge with Resistance Band  - 1 x daily - 7 x weekly - 3 sets - 10 reps - Sidelying Hip Abduction  - 1 x daily - 7 x weekly - 3 sets - 10 reps - Standing Diagonal Chop  - 1 x daily - 7 x weekly - 3 sets - 10 reps - Reverse Chop with Resistance  -  1 x daily - 7 x weekly - 3 sets - 10 reps - Forward T with Counter Support  - 1 x daily - 7 x weekly - 3 sets - 10 reps - Prone Quadriceps Stretch with Strap  - 1 x daily - 7 x weekly - 1 sets - 3 reps - 45 sec - 1 min hold - Seated Knee Flexion with Anchored Resistance  - 1 x daily - 7 x weekly - 2-3 sets - 10 reps - Seated Knee Extension with Resistance  - 1 x daily - 7 x weekly - 2-3 sets - 10 reps - Lateral Step Down  - 1 x daily - 7 x weekly - 2-3 sets - 10 reps  ASSESSMENT:  CLINICAL IMPRESSION: Phillip continues to report R lateral hip and thigh pain as well as increased R knee pain. He states that pain is better in the knee with heat. Assessment of soft tissues indicated DN/MT would likely benefit him and initial trial resulted in excellent twitch responses and immediate decrease in tissue tension. He will likely benefit from further MT/DN to these areas.    OBJECTIVE IMPAIRMENTS: Abnormal gait, decreased endurance, decreased mobility, difficulty walking, decreased ROM, hypomobility, increased fascial restrictions, increased muscle spasms, impaired flexibility, postural dysfunction, and pain.    ACTIVITY LIMITATIONS: carrying, lifting, bending, sitting, standing, squatting, sleeping, stairs, transfers, and locomotion level  PARTICIPATION LIMITATIONS: cleaning, laundry, shopping, and community activity  PERSONAL FACTORS: Past/current experiences, Time since onset of injury/illness/exacerbation, and 1-2 comorbidities: severe degenerative disc disease, R THA, chronic low back pain  are also affecting patient's functional outcome.   REHAB POTENTIAL: Good  CLINICAL DECISION MAKING: Evolving/moderate complexity  EVALUATION COMPLEXITY: Moderate   GOALS: Goals reviewed with patient? Yes  SHORT TERM GOALS: Target date: 01/04/2023   Patient will be independent with initial HEP.  Baseline: given Goal status: MET 12/30/2022- good compliance.   2.  Patient will report centralization of radicular symptoms.  Baseline: radiates to R knee Goal status: MET- 01/05/23  LONG TERM GOALS: Target date: 02/01/2023    Patient will be independent with advanced/ongoing HEP to improve outcomes and carryover.  Baseline:  Goal status: IN PROGRESS  2.  Patient will report 75% improvement in low back pain to improve QOL.  Baseline:  Goal status: MET- 01/05/23 75% improvement  3.  Patient will demonstrate full pain free lumbar ROM to perform ADLs.   Baseline: see objective Goal status: IN PROGRESS  4.  Patient will report 15% improvement on modified Oswestry to demonstrate improved functional ability.  Baseline: 62% disability  Goal status: IN PROGRESS   5.  Patient will tolerate 30 min of walking to return to golf. Baseline: unable Goal status: IN PROGRESS  PLAN:  PT FREQUENCY: 1-2x/week  PT DURATION: 6 weeks  PLANNED INTERVENTIONS: Therapeutic exercises, Therapeutic activity, Neuromuscular re-education, Balance training, Gait training, Patient/Family education, Self Care, Joint mobilization, Stair training, Dry Needling, Electrical stimulation, Spinal mobilization, Cryotherapy, Moist  heat, Traction, Ultrasound, Manual therapy, and Re-evaluation.  PLAN FOR NEXT SESSION: Assess response to DN and continue as indicated possibly with estim to quads. Continue with low back strengthening, neutral spine, extension if tolerated starting in prone, can try traction for radicular symptoms, modalities PRN   Jjesus Hughes, PT 01/13/2023, 10:06 AM

## 2023-01-13 ENCOUNTER — Encounter: Payer: Self-pay | Admitting: Medical

## 2023-01-13 ENCOUNTER — Ambulatory Visit: Payer: Medicare (Managed Care) | Attending: Family Medicine | Admitting: Physical Therapy

## 2023-01-13 ENCOUNTER — Ambulatory Visit: Payer: Medicare (Managed Care) | Admitting: Medical

## 2023-01-13 ENCOUNTER — Encounter: Payer: Self-pay | Admitting: Physical Therapy

## 2023-01-13 VITALS — BP 132/70 | HR 99 | Temp 98.2°F | Resp 18 | Ht 67.0 in | Wt 167.6 lb

## 2023-01-13 DIAGNOSIS — M25561 Pain in right knee: Secondary | ICD-10-CM | POA: Insufficient documentation

## 2023-01-13 DIAGNOSIS — M5441 Lumbago with sciatica, right side: Secondary | ICD-10-CM | POA: Insufficient documentation

## 2023-01-13 DIAGNOSIS — I10 Essential (primary) hypertension: Secondary | ICD-10-CM

## 2023-01-13 DIAGNOSIS — D1779 Benign lipomatous neoplasm of other sites: Secondary | ICD-10-CM | POA: Diagnosis not present

## 2023-01-13 DIAGNOSIS — R252 Cramp and spasm: Secondary | ICD-10-CM | POA: Insufficient documentation

## 2023-01-13 DIAGNOSIS — E785 Hyperlipidemia, unspecified: Secondary | ICD-10-CM | POA: Diagnosis not present

## 2023-01-13 DIAGNOSIS — G8929 Other chronic pain: Secondary | ICD-10-CM

## 2023-01-13 LAB — COMPREHENSIVE METABOLIC PANEL
ALT: 29 U/L (ref 0–53)
AST: 31 U/L (ref 0–37)
Albumin: 3.9 g/dL (ref 3.5–5.2)
Alkaline Phosphatase: 87 U/L (ref 39–117)
BUN: 13 mg/dL (ref 6–23)
CO2: 29 mEq/L (ref 19–32)
Calcium: 9.4 mg/dL (ref 8.4–10.5)
Chloride: 107 mEq/L (ref 96–112)
Creatinine, Ser: 0.97 mg/dL (ref 0.40–1.50)
GFR: 80.55 mL/min (ref >60.00)
Glucose, Bld: 86 mg/dL (ref 70–99)
Potassium: 4.3 mEq/L (ref 3.5–5.1)
Sodium: 142 mEq/L (ref 135–145)
Total Bilirubin: 0.5 mg/dL (ref 0.2–1.2)
Total Protein: 7.1 g/dL (ref 6.0–8.3)

## 2023-01-13 LAB — MAGNESIUM: Magnesium: 2 mg/dL (ref 1.5–2.5)

## 2023-01-13 NOTE — Patient Instructions (Addendum)
1. [place] of other specified sites Placed referral for both areas. If you don't get call by 7-10 days let me know. Also let me know when your appointment is. - Ambulatory referral to General Surgery  2. Cramp in muscle Brief but since on hctz need to check labs/k level. - Comp Met (CMET) - Magnesium  3. Hypertension, unspecified type Continue hctz. Bp better on recheck.  4. Hyperlipidemia, unspecified hyperlipidemia type  Continue atorvastatin.   Overall improved back and rt knee pain. Continue with PT.  Follow up with me in 3 months or sooner if needed

## 2023-01-13 NOTE — Therapy (Signed)
OUTPATIENT PHYSICAL THERAPY TREATMENT   Patient Name: Phillip Hughes MRN: [id number] DOB:October 30, 1956, 67 y.o., male Today's Date: 01/17/2023  END OF SESSION:  PT End of Session - 01/17/23 0851     Visit Number 8    Number of Visits 12    Date for PT Re-Evaluation 02/01/23    Authorization Type cigna    PT Start Time [phone]    PT Stop Time 0934    PT Time Calculation (min) 43 min    Activity Tolerance Patient tolerated treatment well    Behavior During Therapy [facility] for tasks assessed/performed               Past Medical History:  Diagnosis Date   Hypertension    Sciatica    Sinusitis    Past Surgical History:  Procedure Laterality Date   [place]     NECK SURGERY     Patient Active Problem List   Diagnosis Date Noted   Patellofemoral pain syndrome of right knee 12/01/2022   Lumbar radiculopathy 11/29/2022   Peroneal mononeuropathy, 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    PCP: Mackie Pai, PA-C  REFERRING PROVIDER: Rosemarie Ax, MD   REFERRING DIAG: M54.16 (ICD-10-CM) - Lumbar radiculopathy   Rationale for Evaluation and Treatment: Rehabilitation  THERAPY DIAG:  Chronic right-sided low back pain with right-sided sciatica  Cramp and spasm  ONSET DATE: April 2022  SUBJECTIVE:                                                                                                                                                                                           SUBJECTIVE STATEMENT: Patient reported he wasn't sore on Friday but then on Saturday was very sore in R thigh. No reports of radicular pain since last week.  PERTINENT HISTORY:  R hip replacement, ACDF, L4-5 lumbar laminectomy 2017  PAIN:  Are you having pain? Yes: NPRS scale: 3/10 Pain location: right knee Pain description: aching Aggravating factors: walking, standing Relieving factors: pain medication  PRECAUTIONS: None  WEIGHT BEARING RESTRICTIONS: No  FALLS:   Has patient fallen in last 6 months? Yes. Number of falls 1 -off ladder while trimming bushes, ladder was on grass and tipped. No falls due to loss of balance.   LIVING ENVIRONMENT: Lives with: lives with their spouse Lives in: House/apartment Stairs: Yes: Internal: 15 steps; on right going up and to basement, has trouble going up, sometimes goes out basement door and walks around house rather than go up stairs.  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 Severe degenerative disc disease again seen at L2-3, with vacuum disc phenomenon and mild grade 1 degenerative retrolisthesis measuring approximately 4 mm. Lower lumbar facet DJD is seen bilaterally at L4-5 and L5-S1. No focal lytic or sclerotic bone lesions identified.   IMPRESSION: No acute findings.   Stable severe L2-3 degenerative disc disease and mild grade 1 degenerative retrolisthesis.   Lower lumbar facet DJD.  PATIENT SURVEYS:  Modified Oswestry 31/50 =62%   SCREENING FOR RED FLAGS: Bowel or bladder incontinence: No Spinal tumors: No Cauda equina syndrome: No Compression fracture: No Abdominal aneurysm: No  COGNITION: Overall cognitive status: Within functional limits for tasks assessed     SENSATION: WFL  MUSCLE LENGTH: Hamstrings: Right 60 deg; Left 60 deg   POSTURE: decreased lumbar lordosis  PALPATION: Tenderness R lumbar paraspinals, L2/L3.  Decreased mobility L3-5  LUMBAR ROM:   AROM eval  Flexion Almost to toes, increased knee pain  Extension Wnl, increased pain  Right lateral flexion Past knee, tight  Left lateral flexion  Past knee, tight  Right rotation WNL, increased radicular pain  Left rotation WNL increased LBP   (Blank rows = not tested)  LOWER EXTREMITY ROM:     Passive  Right eval Left eval  Hip flexion 70 70  Hip internal rotation 10 25  Hip external rotation 30 30   (Blank rows = not tested)  LOWER EXTREMITY MMT:    MMT Right eval Left eval  Hip flexion 5 5  Hip extension 5 5  Hip abduction 5 5  Hip adduction 5 5  Knee flexion 5 5  Knee extension 5 5  Ankle dorsiflexion 5 5   (Blank rows = not tested)  LUMBAR SPECIAL TESTS:  Straight leg raise test: Negative and Slump test: Negative  FUNCTIONAL TESTS:  5 times sit to stand: 16 seconds, increased LBP  GAIT: Distance walked: 50 Assistive device utilized: None Level of assistance: Complete Independence Comments: very antalgic  TODAY'S TREATMENT:                                                                                                                              DATE:   01/17/23 Therapeutic Exercise: to improve strength and mobility.  Demo, verbal and tactile cues throughout for technique. Elliptical L2 x 5 min  Prone quad stretch R 2 x 60 sec Prone on elbow with quad stretch with strap 2x60 sec for hip flexor stretch bil (pt unable to feel hip flexor stretch in sitting or standing) Reviewed ITB with strap. Seated modified fig 4 with foot on vertical bolster 2x30 sec Standing  lumbar ext x 5   Manual Therapy: to decrease muscle spasm and pain and improve mobility STM to R lateral quads; MFR to R ITB Skilled palpation and monitoring of soft tissues during DN Trigger Point Dry-Needling  Treatment instructions: Expect mild to moderate muscle soreness. S/S of pneumothorax if dry needled over a lung field, and to seek immediate medical attention should they occur. Patient verbalized understanding of these instructions and education. Patient Consent Given: Yes Education handout provided: Yes Muscles treated: R  VLO Electrical stimulation performed: No Parameters: N/A Treatment response/outcome: Twitch Response Elicited and Palpable Increase in Muscle Length  01/13/23 Therapeutic Exercise: to improve strength and mobility.  Demo, verbal and tactile cues throughout for technique. Rec Bike L4 x 6 min  Lumbar tracton at sink x 20 sec  Functional squat position with OH shoulder flexion x 10 sec hold to bil shoulder ext x 2 Seated modified fig 4 with foot on stool 3x30 sec  Manual Therapy: to decrease muscle spasm and pain and improve mobility STM to R gluteals, piriformis and lateral quads; MFR to R ITB Skilled palpation and monitoring of soft tissues during DN Trigger Point Dry-Needling  Treatment instructions: Expect mild to moderate muscle soreness. S/S of pneumothorax if dry needled over a lung field, and to seek immediate medical attention should they occur. Patient verbalized understanding of these instructions and education. Patient Consent Given: Yes Education handout provided: Yes Muscles treated: R glute min, med, piriformis, VLO, VM, RF Electrical stimulation performed: No Parameters: N/A Treatment response/outcome: Twitch Response Elicited and Palpable Increase in Muscle Length   PATIENT EDUCATION:  Education details: HEP update  Person educated: Patient Education method: Explanation, Demonstration, Verbal cues, and Handouts Education comprehension: verbalized understanding and returned demonstration  HOME EXERCISE PROGRAM: Access Code: [email or web address] URL: [email or web address] Date: 01/17/2023 Prepared by: Almyra Free  Exercises - Prone Hip Extension  - 1 x daily - 7 x weekly - 3 sets - 10 reps - Prone Press Up On Elbows  - 1 x daily - 7 x weekly - 3 sets - 10 reps - Supine Bridge with Resistance Band  - 1 x daily - 7 x weekly - 3 sets - 10 reps - Sidelying Hip Abduction  - 1 x daily - 7 x weekly - 3 sets - 10 reps - Standing Diagonal Chop  - 1 x daily - 7 x weekly - 3 sets  - 10 reps - Reverse Chop with Resistance  - 1 x daily - 7 x weekly - 3 sets - 10 reps - Forward T with Counter Support  - 1 x daily - 7 x weekly - 3 sets - 10 reps - Prone Quadriceps Stretch with Strap  - 1 x daily - 7 x weekly - 1 sets - 3 reps - 45 sec - 1 min hold - Seated Knee Flexion with Anchored Resistance  - 1 x daily - 7 x weekly - 2-3 sets - 10 reps - Seated Knee Extension with Resistance  - 1 x daily - 7 x weekly - 2-3 sets - 10 reps - Lateral Step Down  - 1 x daily - 7 x weekly - 2-3 sets - 10 reps  ASSESSMENT:  CLINICAL IMPRESSION: Maricus reports no radicular pain since last visit, but did have increased soreness in R lateral quad after DN last visit. Today he demonstrated decreased tissue tension overall but distal lateral quad was still very tight and he had excellent twitch response with TPDN. Estim with DN was not  attempted today, but may be helpful in the future if tolerated. His hip flexors are tight bil L>R. We modified prone quad stretch to be performed on elbows to address this, but patient advised to be mindful if low back starts to hurt more in this position. Dani continues to demonstrate potential for improvement and would benefit from continued skilled therapy to address impairments.     OBJECTIVE IMPAIRMENTS: Abnormal gait, decreased endurance, decreased mobility, difficulty walking, decreased ROM, hypomobility, increased fascial restrictions, increased muscle spasms, impaired flexibility, postural dysfunction, and pain.   ACTIVITY LIMITATIONS: carrying, lifting, bending, sitting, standing, squatting, sleeping, stairs, transfers, and locomotion level  PARTICIPATION LIMITATIONS: cleaning, laundry, shopping, and community activity  PERSONAL FACTORS: Past/current experiences, Time since onset of injury/illness/exacerbation, and 1-2 comorbidities: severe degenerative disc disease, R THA, chronic low back pain  are also affecting patient's functional outcome.   REHAB  POTENTIAL: Good  CLINICAL DECISION MAKING: Evolving/moderate complexity  EVALUATION COMPLEXITY: Moderate   GOALS: Goals reviewed with patient? Yes  SHORT TERM GOALS: Target date: 01/04/2023   Patient will be independent with initial HEP.  Baseline: given Goal status: MET 12/30/2022- good compliance.   2.  Patient will report centralization of radicular symptoms.  Baseline: radiates to R knee Goal status: MET- 01/05/23  LONG TERM GOALS: Target date: 02/01/2023    Patient will be independent with advanced/ongoing HEP to improve outcomes and carryover.  Baseline:  Goal status: IN PROGRESS  2.  Patient will report 75% improvement in low back pain to improve QOL.  Baseline:  Goal status: MET- 01/05/23 75% improvement  3.  Patient will demonstrate full pain free lumbar ROM to perform ADLs.   Baseline: see objective Goal status: IN PROGRESS  4.  Patient will report 15% improvement on modified Oswestry to demonstrate improved functional ability.  Baseline: 62% disability  Goal status: IN PROGRESS   5.  Patient will tolerate 30 min of walking to return to golf. Baseline: unable Goal status: IN PROGRESS  PLAN:  PT FREQUENCY: 1-2x/week  PT DURATION: 6 weeks  PLANNED INTERVENTIONS: Therapeutic exercises, Therapeutic activity, Neuromuscular re-education, Balance training, Gait training, Patient/Family education, Self Care, Joint mobilization, Stair training, Dry Needling, Electrical stimulation, Spinal mobilization, Cryotherapy, Moist heat, Traction, Ultrasound, Manual therapy, and Re-evaluation.  PLAN FOR NEXT SESSION: Assess response to DN and continue as indicated possibly with estim to quads. Continue with low back strengthening, neutral spine, extension if tolerated starting in prone, can try traction for radicular symptoms, modalities PRN   Annamae Shivley, PT 01/17/2023, 10:32 AM

## 2023-01-13 NOTE — Progress Notes (Signed)
Subjective:    Patient ID: Phillip Hughes, male    DOB: 02-Nov-1956, 67 y.o.   MRN: [id number]  HPI  Last visit AVS below.    "Htn- bp well controlled. Continue hctz 25 mg daily.    High cholesterol- continue atorvastatin. Cmp and lipid panel recently done.   Lumbar radicular pain and knee pain. Being seen by sports med MD and emg pending. Will refill norco pending emg studies."  Pt bp mild elevated today initially. He continue on hctz. On recheck bp better. Mild cramp to toes on Tuesday. Then resolved. This occcured at rest in bed.  On review nov 2023 lipid panel good only mild ldl elevation of 102. No hx of mi or stroke.    For rt knee pt did see Dr. Raeford Razor. Below is his A/P  " ASSESSMENT & PLAN:    Peroneal mononeuropathy, left The EMG only showed a left side neuropathy. His symptoms have improved with the leg length discrepancy.  - continue heel pad on left    Patellofemoral pain syndrome of right knee Pain has improved with correcting left length discrepancy and with physical therapy  - counseled on home exercise therapy and supportive care - continue PT  - could consider genicular nerve block "   Also past week noticed lump on his back rt side lower area.   In addition he has lump rt shoulder for years. Since 2003. No pain on moving his shoulder. Occasional mild twinge pain to shoulder.   Review of Systems  Constitutional:  Negative for diaphoresis, fatigue and fever.  Respiratory:  Negative for cough, chest tightness, shortness of breath and wheezing.   Cardiovascular:  Negative for chest pain and palpitations.  Gastrointestinal:  Negative for abdominal pain and blood in stool.  Genitourinary:  Negative for dysuria.  Musculoskeletal:  Negative for back pain and joint swelling.  Skin:  Negative for rash.       See hpi.  Neurological:  Negative for dizziness, syncope, numbness and headaches.  Hematological:  Negative for adenopathy. Does not bruise/bleed  easily.  Psychiatric/Behavioral:  Negative for behavioral problems. The patient is not nervous/anxious.    Past Medical History:  Diagnosis Date   Hypertension    Sciatica    Sinusitis      Social History   Socioeconomic History   Marital status: Married    Spouse name: Not on file   Number of children: Not on file   Years of education: Not on file   Highest education level: Not on file  Occupational History   Not on file  Tobacco Use   Smoking status: Former    Packs/day: 1.00    Years: 28.00    Total pack years: 28.00    Types: Cigarettes   Smokeless tobacco: Never  Vaping Use   Vaping Use: Never used  Substance and Sexual Activity   Alcohol use: Yes    Comment: rarely   Drug use: No   Sexual activity: Not on file  Other Topics Concern   Not on file  Social History Narrative   Not on file   Social Determinants of Health   Financial Resource Strain: Not on file  Food Insecurity: Not on file  Transportation Needs: Not on file  Physical Activity: Not on file  Stress: Not on file  Social Connections: Not on file  Intimate Partner Violence: Not on file    Past Surgical History:  Procedure Laterality Date   BACK SURGERY  HERNIA REPAIR     HIP SURGERY     NECK SURGERY      No family history on file.  Allergies  Allergen Reactions   Gabapentin Other (See Comments)    Mood Swings and Depression Mood Swings and Depression Mood Swings and Depression Mood Swings and Depression    Statins Other (See Comments)    Current Outpatient Medications on File Prior to Visit  Medication Sig Dispense Refill   atorvastatin (LIPITOR) 20 MG tablet Take 1 tablet by mouth daily.     celecoxib (CELEBREX) 200 MG capsule Take 1 capsule (200 mg total) by mouth 2 (two) times daily as needed. 60 capsule 2   diclofenac Sodium (VOLTAREN) 1 % GEL Apply 2 g topically 4 (four) times daily as needed. 100 g 0   hydrochlorothiazide (HYDRODIURIL) 25 MG tablet Take 1 tablet (25 mg  total) by mouth daily. 30 tablet 0   HYDROcodone-acetaminophen (NORCO) 5-325 MG tablet Take 1 tablet by mouth every 6 (six) hours as needed for moderate pain. 6 tablet 0   predniSONE (DELTASONE) 20 MG tablet Take 2 tablets (40 mg total) by mouth daily. 10 tablet 0   No current facility-administered medications on file prior to visit.    BP 132/70   Pulse 99   Temp 98.2 F (36.8 C)   Resp 18   Ht '5\' 7"'$  (1.702 m)   Wt 167 lb 9.6 oz (76 kg)   SpO2 100%   BMI 26.25 kg/m         Objective:   Physical Exam  General- No acute distress. Pleasant patient. Neck- Full range of motion, no jvd Lungs- Clear, even and unlabored. Heart- regular rate and rhythm. Neurologic- CNII- XII grossly intact.  Skin- rt side lower back 2.5 cm lipoma. Rt shoulder 4 cm lipoma.     Assessment & Plan:   Patient Instructions  1. Lipoma of other specified sites Placed referral for both areas. If you don't get call by 7-10 days let me know. Also let me know when your appointment is. - Ambulatory referral to General Surgery  2. Cramp in muscle Brief but since on hctz need to check labs/k level. - Comp Met (CMET) - Magnesium  3. Hypertension, unspecified type Continue hctz. Bp better on recheck.  4. Hyperlipidemia, unspecified hyperlipidemia type  Continue atorvastatin.   Overall improved back and rt knee pain. Continue with PT.  Follow up with me in 3 months or sooner if needed    [employer], PA-C

## 2023-01-17 ENCOUNTER — Encounter: Payer: Self-pay | Admitting: Physical Therapy

## 2023-01-17 ENCOUNTER — Ambulatory Visit: Payer: Medicare (Managed Care) | Admitting: Physical Therapy

## 2023-01-17 DIAGNOSIS — M5441 Lumbago with sciatica, right side: Secondary | ICD-10-CM | POA: Diagnosis not present

## 2023-01-17 DIAGNOSIS — G8929 Other chronic pain: Secondary | ICD-10-CM

## 2023-01-17 DIAGNOSIS — R252 Cramp and spasm: Secondary | ICD-10-CM

## 2023-01-20 ENCOUNTER — Ambulatory Visit: Payer: Medicare (Managed Care) | Admitting: Physical Therapy

## 2023-01-20 ENCOUNTER — Encounter: Payer: Self-pay | Admitting: Physical Therapy

## 2023-01-20 DIAGNOSIS — M5441 Lumbago with sciatica, right side: Secondary | ICD-10-CM | POA: Diagnosis not present

## 2023-01-20 DIAGNOSIS — G8929 Other chronic pain: Secondary | ICD-10-CM

## 2023-01-20 DIAGNOSIS — R252 Cramp and spasm: Secondary | ICD-10-CM

## 2023-01-20 NOTE — Therapy (Signed)
OUTPATIENT PHYSICAL THERAPY TREATMENT   Patient Name: Phillip Hughes MRN: [id number] DOB:[birth year], 67 y.o., male Today's Date: 01/20/2023  END OF SESSION:  PT End of Session - 01/20/23 0847     Visit Number 9    Number of Visits 12    Date for PT Re-Evaluation 02/01/23    Authorization Type cigna    PT Start Time 0845    PT Stop Time 0930    PT Time Calculation (min) 45 min    Activity Tolerance Patient tolerated treatment well    Behavior During Therapy [facility] for tasks assessed/performed               Past Medical History:  Diagnosis Date   Hypertension    Sciatica    Sinusitis    Past Surgical History:  Procedure Laterality Date   BACK SURGERY     HERNIA REPAIR     HIP SURGERY     NECK SURGERY     Patient Active Problem List   Diagnosis Date Noted   Patellofemoral pain syndrome of right knee 12/01/2022   Lumbar radiculopathy 11/29/2022   Peroneal mononeuropathy, 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    PCP: Mackie Pai, PA-C  REFERRING PROVIDER: Rosemarie Ax, MD   REFERRING DIAG: M54.16 (ICD-10-CM) - Lumbar radiculopathy   Rationale for Evaluation and Treatment: Rehabilitation  THERAPY DIAG:  Chronic right-sided low back pain with right-sided sciatica  Cramp and spasm  ONSET DATE: April 2022  SUBJECTIVE:                                                                                                                                                                                           SUBJECTIVE STATEMENT: Patient reports he was very sore after needling last time and does not think he can take it today.  Pain is from R hip to knee today, very tight and hard to stretch, but no back pain   PERTINENT HISTORY:  R hip replacement, ACDF, L4-5 lumbar laminectomy 2017  PAIN:  Are you having pain? Yes: NPRS scale: 4/10 Pain location: right hip to knee Pain description: aching Aggravating factors: walking, standing Relieving factors: pain  medication  PRECAUTIONS: None  WEIGHT BEARING RESTRICTIONS: No  FALLS:  Has patient fallen in last 6 months? Yes. Number of falls 1 -off ladder while trimming bushes, ladder was on grass and tipped. No falls due to loss of balance.   LIVING ENVIRONMENT: Lives with: lives with their spouse Lives in: House/apartment Stairs: Yes: Internal: 15 steps; on right going up and to basement, has trouble going up, sometimes goes out basement door and walks around house rather than  go up stairs.  Has following equipment at home: Single point cane and Walker - 2 wheeled  OCCUPATION: retired  PLOF: Independent, Psychologist, occupational work at [employer] to be able to play golf, had to stop playing golf due to pain  PATIENT GOALS: decrease pain, be able to play golf again  NEXT MD VISIT: 12/31/2022  OBJECTIVE:   DIAGNOSTIC FINDINGS:  05/28/2022 DG lumbar spine FINDINGS: There is no evidence of lumbar spine fracture. Severe degenerative disc disease again seen at L2-3, with vacuum disc phenomenon and mild grade 1 degenerative retrolisthesis measuring approximately 4 mm. Lower lumbar facet DJD is seen bilaterally at L4-5 and L5-S1. No focal lytic or sclerotic bone lesions identified.   IMPRESSION: No acute findings.   Stable severe L2-3 degenerative disc disease and mild grade 1 degenerative retrolisthesis.   Lower lumbar facet DJD.  PATIENT SURVEYS:  Modified Oswestry 31/50 =62%   SCREENING FOR RED FLAGS: Bowel or bladder incontinence: No Spinal tumors: No Cauda equina syndrome: No Compression fracture: No Abdominal aneurysm: No  COGNITION: Overall cognitive status: Within functional limits for tasks assessed     SENSATION: WFL  MUSCLE LENGTH: Hamstrings: Right 60 deg; Left 60 deg   POSTURE: decreased lumbar lordosis  PALPATION: Tenderness R lumbar paraspinals, L2/L3.  Decreased mobility L3-5  LUMBAR ROM:   AROM eval  Flexion Almost to toes, increased knee pain  Extension Wnl,  increased pain  Right lateral flexion Past knee, tight  Left lateral flexion Past knee, tight  Right rotation WNL, increased radicular pain  Left rotation WNL increased LBP   (Blank rows = not tested)  LOWER EXTREMITY ROM:     Passive  Right eval Left eval  Hip flexion 70 70  Hip internal rotation 10 25  Hip external rotation 30 30   (Blank rows = not tested)  LOWER EXTREMITY MMT:    MMT Right eval Left eval  Hip flexion 5 5  Hip extension 5 5  Hip abduction 5 5  Hip adduction 5 5  Knee flexion 5 5  Knee extension 5 5  Ankle dorsiflexion 5 5   (Blank rows = not tested)  LUMBAR SPECIAL TESTS:  Straight leg raise test: Negative and Slump test: Negative  FUNCTIONAL TESTS:  5 times sit to stand: 16 seconds, increased LBP  GAIT: Distance walked: 50 Assistive device utilized: None Level of assistance: Complete Independence Comments: very antalgic  TODAY'S TREATMENT:                                                                                                                              DATE:  01/20/23 Therapeutic Exercise: to improve strength and mobility.  Demo, verbal and tactile cues throughout for technique. Bike L2 x 6 min   Golf swings x 10 - no pain Golfer's lift x 5 R, x 10 L - increased R knee pain Hip IR/ER repeated stretches in supine L knee piriformis stretches  (too painful on  R today) Review HEP Manual Therapy: to decrease muscle spasm and pain and improve mobility IASTM with s/s tools to R ITBand/Vastus lateralis, patellar tendon, STM/TPR to R distal hamstrings and Itband, myofascial release to R ITBand Modalities: MHP applied to R knee x 5 min after session (not included in session time).    01/17/23 Therapeutic Exercise: to improve strength and mobility.  Demo, verbal and tactile cues throughout for technique. Elliptical L2 x 5 min  Prone quad stretch R 2 x 60 sec Prone on elbow with quad stretch with strap 2x60 sec for hip flexor stretch bil (pt  unable to feel hip flexor stretch in sitting or standing) Reviewed ITB with strap. Seated modified fig 4 with foot on vertical bolster 2x30 sec Standing lumbar ext x 5   Manual Therapy: to decrease muscle spasm and pain and improve mobility STM to R lateral quads; MFR to R ITB Skilled palpation and monitoring of soft tissues during DN Trigger Point Dry-Needling  Treatment instructions: Expect mild to moderate muscle soreness. S/S of pneumothorax if dry needled over a lung field, and to seek immediate medical attention should they occur. Patient verbalized understanding of these instructions and education. Patient Consent Given: Yes Education handout provided: Yes Muscles treated: R VLO Electrical stimulation performed: No Parameters: N/A Treatment response/outcome: Twitch Response Elicited and Palpable Increase in Muscle Length  01/13/23 Therapeutic Exercise: to improve strength and mobility.  Demo, verbal and tactile cues throughout for technique. Rec Bike L4 x 6 min  Lumbar tracton at sink x 20 sec  Functional squat position with OH shoulder flexion x 10 sec hold to bil shoulder ext x 2 Seated modified fig 4 with foot on stool 3x30 sec  Manual Therapy: to decrease muscle spasm and pain and improve mobility STM to R gluteals, piriformis and lateral quads; MFR to R ITB Skilled palpation and monitoring of soft tissues during DN Trigger Point Dry-Needling  Treatment instructions: Expect mild to moderate muscle soreness. S/S of pneumothorax if dry needled over a lung field, and to seek immediate medical attention should they occur. Patient verbalized understanding of these instructions and education. Patient Consent Given: Yes Education handout provided: Yes Muscles treated: R glute min, med, piriformis, VLO, VM, RF Electrical stimulation performed: No Parameters: N/A Treatment response/outcome: Twitch Response Elicited and Palpable Increase in Muscle Length   PATIENT EDUCATION:   Education details: HEP update  Person educated: Patient Education method: Explanation, Demonstration, Verbal cues, and Handouts Education comprehension: verbalized understanding and returned demonstration  HOME EXERCISE PROGRAM: Access Code: [email or web address] URL: [email or web address] Date: 01/17/2023 Prepared by: Almyra Free  Exercises - Prone Hip Extension  - 1 x daily - 7 x weekly - 3 sets - 10 reps - Prone Press Up On Elbows  - 1 x daily - 7 x weekly - 3 sets - 10 reps - Supine Bridge with Resistance Band  - 1 x daily - 7 x weekly - 3 sets - 10 reps - Sidelying Hip Abduction  - 1 x daily - 7 x weekly - 3 sets - 10 reps - Standing Diagonal Chop  - 1 x daily - 7 x weekly - 3 sets - 10 reps - Reverse Chop with Resistance  - 1 x daily - 7 x weekly - 3 sets - 10 reps - Forward T with Counter Support  - 1 x daily - 7 x weekly - 3 sets - 10 reps - Prone Quadriceps Stretch with Strap  - 1 x daily - 7 x weekly -  1 sets - 3 reps - 45 sec - 1 min hold - Seated Knee Flexion with Anchored Resistance  - 1 x daily - 7 x weekly - 2-3 sets - 10 reps - Seated Knee Extension with Resistance  - 1 x daily - 7 x weekly - 2-3 sets - 10 reps - Lateral Step Down  - 1 x daily - 7 x weekly - 2-3 sets - 10 reps  ASSESSMENT:  CLINICAL IMPRESSION: Jrake reports significant increase in pain/soreness along R Itband to R knee today.  He was able to swing golf club with no complaint of pain, but single leg activity (golfers lift) increased R knee pain significantly.  He reports this soreness has been present since dry needling earlier in the week.  Focused interventions on decreasing pain and tightness today along R Itband and hip, followed by MHP, after which Ollen Gross reported decreased pain and tightness.  Abdalrahman continues to demonstrate potential for improvement and would benefit from continued skilled therapy to address impairments.    OBJECTIVE IMPAIRMENTS: Abnormal gait, decreased endurance, decreased mobility,  difficulty walking, decreased ROM, hypomobility, increased fascial restrictions, increased muscle spasms, impaired flexibility, postural dysfunction, and pain.   ACTIVITY LIMITATIONS: carrying, lifting, bending, sitting, standing, squatting, sleeping, stairs, transfers, and locomotion level  PARTICIPATION LIMITATIONS: cleaning, laundry, shopping, and community activity  PERSONAL FACTORS: Past/current experiences, Time since onset of injury/illness/exacerbation, and 1-2 comorbidities: severe degenerative disc disease, R THA, chronic low back pain  are also affecting patient's functional outcome.   REHAB POTENTIAL: Good  CLINICAL DECISION MAKING: Evolving/moderate complexity  EVALUATION COMPLEXITY: Moderate   GOALS: Goals reviewed with patient? Yes  SHORT TERM GOALS: Target date: 01/04/2023   Patient will be independent with initial HEP.  Baseline: given Goal status: MET 12/30/2022- good compliance.   2.  Patient will report centralization of radicular symptoms.  Baseline: radiates to R knee Goal status: MET- 01/05/23  LONG TERM GOALS: Target date: 02/01/2023    Patient will be independent with advanced/ongoing HEP to improve outcomes and carryover.  Baseline:  Goal status: IN PROGRESS  2.  Patient will report 75% improvement in low back pain to improve QOL.  Baseline:  Goal status: MET- 01/05/23 75% improvement  3.  Patient will demonstrate full pain free lumbar ROM to perform ADLs.   Baseline: see objective Goal status: IN PROGRESS  4.  Patient will report 15% improvement on modified Oswestry to demonstrate improved functional ability.  Baseline: 62% disability  Goal status: IN PROGRESS   5.  Patient will tolerate 30 min of walking to return to golf. Baseline: unable Goal status: IN PROGRESS  PLAN:  PT FREQUENCY: 1-2x/week  PT DURATION: 6 weeks  PLANNED INTERVENTIONS: Therapeutic exercises, Therapeutic activity, Neuromuscular re-education, Balance training, Gait  training, Patient/Family education, Self Care, Joint mobilization, Stair training, Dry Needling, Electrical stimulation, Spinal mobilization, Cryotherapy, Moist heat, Traction, Ultrasound, Manual therapy, and Re-evaluation.  PLAN FOR NEXT SESSION: Continue to progress activity tolerance, goal is to return to golf, watch for aggravation of R knee pain.    Rennie Natter, PT, DPT  01/20/2023, 11:29 AM

## 2023-01-25 ENCOUNTER — Ambulatory Visit: Payer: Medicare (Managed Care) | Admitting: Physical Therapy

## 2023-01-25 ENCOUNTER — Encounter: Payer: Self-pay | Admitting: Physical Therapy

## 2023-01-25 DIAGNOSIS — R252 Cramp and spasm: Secondary | ICD-10-CM

## 2023-01-25 DIAGNOSIS — G8929 Other chronic pain: Secondary | ICD-10-CM

## 2023-01-25 DIAGNOSIS — M5441 Lumbago with sciatica, right side: Secondary | ICD-10-CM | POA: Diagnosis not present

## 2023-01-25 NOTE — Therapy (Signed)
OUTPATIENT PHYSICAL THERAPY TREATMENT   Patient Name: Phillip Hughes MRN: [id number] DOB:Sep 27, 1956, 67 y.o., male Today's Date: 01/25/2023  END OF SESSION:  PT End of Session - 01/25/23 0849     Visit Number 10    Number of Visits 12    Date for PT Re-Evaluation 02/01/23    Authorization Type cigna    PT Start Time [id number]    Activity Tolerance Patient tolerated treatment well    Behavior During Therapy [facility] for tasks assessed/performed               Past Medical History:  Diagnosis Date   Hypertension    Sciatica    Sinusitis    Past Surgical History:  Procedure Laterality Date   BACK SURGERY     HERNIA REPAIR     HIP SURGERY     NECK SURGERY     Patient Active Problem List   Diagnosis Date Noted   Patellofemoral pain syndrome of right knee 12/01/2022   Lumbar radiculopathy 11/29/2022   Peroneal mononeuropathy, 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    PCP: Mackie Pai, PA-C  REFERRING PROVIDER: Rosemarie Ax, MD   REFERRING DIAG: M54.16 (ICD-10-CM) - Lumbar radiculopathy   Rationale for Evaluation and Treatment: Rehabilitation  THERAPY DIAG:  Chronic right-sided low back pain with right-sided sciatica  Cramp and spasm  ONSET DATE: April 2022  SUBJECTIVE:                                                                                                                                                                                           SUBJECTIVE STATEMENT: Patient reports he has not had any back pain, but continues to have intermittent knee pain, sore, feels like related to distal Itband.  He has had therapy in past for Itband.  Pain in shin and knee this weekend.   PERTINENT HISTORY:  R hip replacement, ACDF, L4-5 lumbar laminectomy 2017  PAIN:  Are you having pain? Yes: NPRS scale: 4/10 Pain location: R knee Pain description: sore Aggravating factors: walking, standing Relieving factors: pain medication  PRECAUTIONS: None  WEIGHT BEARING RESTRICTIONS:  No  FALLS:  Has patient fallen in last 6 months? Yes. Number of falls 1 -off ladder while trimming bushes, ladder was on grass and tipped. No falls due to loss of balance.   LIVING ENVIRONMENT: Lives with: lives with their spouse Lives in: House/apartment Stairs: Yes: Internal: 15 steps; on right going up and to basement, has trouble going up, sometimes goes out basement door and walks around house rather than go up stairs.  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 Severe degenerative disc disease again seen at L2-3, with vacuum disc phenomenon and mild grade 1 degenerative retrolisthesis measuring approximately 4 mm. Lower lumbar facet DJD is seen bilaterally at L4-5 and L5-S1. No focal lytic or sclerotic bone lesions identified.   IMPRESSION: No acute findings.   Stable severe L2-3 degenerative disc disease and mild grade 1 degenerative retrolisthesis.   Lower lumbar facet DJD.  PATIENT SURVEYS:  Modified Oswestry 31/50 =62%   SCREENING FOR RED FLAGS: Bowel or bladder incontinence: No Spinal tumors: No Cauda equina syndrome: No Compression fracture: No Abdominal aneurysm: No  COGNITION: Overall cognitive status: Within functional limits for tasks assessed     SENSATION: WFL  MUSCLE LENGTH: Hamstrings: Right 60 deg; Left 60 deg   POSTURE: decreased lumbar lordosis  PALPATION: Tenderness R lumbar paraspinals, L2/L3.  Decreased mobility L3-5  LUMBAR ROM:   AROM eval  Flexion Almost to toes, increased knee pain  Extension Wnl, increased pain  Right lateral flexion Past knee, tight  Left  lateral flexion Past knee, tight  Right rotation WNL, increased radicular pain  Left rotation WNL increased LBP   (Blank rows = not tested)  LOWER EXTREMITY ROM:     Passive  Right eval Left eval  Hip flexion 70 70  Hip internal rotation 10 25  Hip external rotation 30 30   (Blank rows = not tested)  LOWER EXTREMITY MMT:    MMT Right eval Left eval  Hip flexion 5 5  Hip extension 5 5  Hip abduction 5 5  Hip adduction 5 5  Knee flexion 5 5  Knee extension 5 5  Ankle dorsiflexion 5 5   (Blank rows = not tested)  LUMBAR SPECIAL TESTS:  Straight leg raise test: Negative and Slump test: Negative  FUNCTIONAL TESTS:  5 times sit to stand: 16 seconds, increased LBP  GAIT: Distance walked: 50 Assistive device utilized: None Level of assistance: Complete Independence Comments: very antalgic  TODAY'S TREATMENT:                                                                                                                              DATE:  01/25/23 Therapeutic Exercise: to improve strength and mobility.  Demo, verbal and tactile cues throughout for technique. Bike L1 x 6 min  Straight leg Raise - supine 3 x 10 R, S/L 2 x 10 R - reported increased pain, unable to tolerated on L due to bent knee position on R Hamstring stretch with strap x 1 min - hooklying to avoid stress on low back   Itband stretch with strap  x 1 min  - hooklying to avoid stress on low back Manual Therapy: to decrease muscle spasm and pain and improve mobility STM/TPR to R ITBand, vastus lateralis, IASTM with s/s tools to ITBAnd, patellar mobs.   01/20/23 Therapeutic Exercise: to improve strength and mobility.  Demo, verbal and tactile cues throughout for technique. Bike L2 x 6 min   Golf swings x 10 - no pain Golfer's lift x 5 R, x 10 L - increased R knee pain Hip IR/ER repeated stretches in supine L knee piriformis stretches  (too painful on R today) Review HEP Manual Therapy: to decrease muscle spasm  and pain and improve mobility IASTM with s/s tools to R ITBand/Vastus lateralis, patellar tendon, STM/TPR to R distal hamstrings and Itband, myofascial release to R ITBand Modalities: MHP applied to R knee x 5 min after session (not included in session time).    01/17/23 Therapeutic Exercise: to improve strength and mobility.  Demo, verbal and tactile cues throughout for technique. Elliptical L2 x 5 min  Prone quad stretch R 2 x 60 sec Prone on elbow with quad stretch with strap 2x60 sec for hip flexor stretch bil (pt unable to feel hip flexor stretch in sitting or standing) Reviewed ITB with strap. Seated modified fig 4 with foot on vertical bolster 2x30 sec Standing lumbar ext x 5   Manual Therapy: to decrease muscle spasm and pain and improve mobility STM to R lateral quads; MFR to R ITB Skilled palpation and monitoring of soft tissues during DN Trigger Point Dry-Needling  Treatment instructions: Expect mild to moderate muscle soreness. S/S of pneumothorax if dry needled over a lung field, and to seek immediate medical attention should they occur. Patient verbalized understanding of these instructions and education. Patient Consent Given: Yes Education handout provided: Yes Muscles treated: R VLO Electrical stimulation performed: No Parameters: N/A Treatment response/outcome: Twitch Response Elicited and Palpable Increase in Muscle Length   PATIENT EDUCATION:  Education details: HEP update  Person educated: Patient Education method: Explanation, Demonstration, Verbal cues, and Handouts Education comprehension: verbalized understanding and returned demonstration  HOME EXERCISE PROGRAM: Access Code: [email or web address] URL: [email or web address] Date: 01/17/2023 Prepared by: Almyra Free  Exercises - Prone Hip Extension  - 1 x daily - 7 x weekly - 3 sets - 10 reps - Prone Press Up On Elbows  - 1 x daily - 7 x weekly - 3 sets - 10 reps - Supine Bridge with Resistance Band  - 1 x  daily - 7 x weekly - 3 sets - 10 reps - Sidelying Hip Abduction  - 1 x daily - 7 x weekly - 3 sets - 10 reps - Standing Diagonal Chop  - 1 x daily - 7 x weekly - 3 sets - 10 reps - Reverse Chop with Resistance  - 1 x daily - 7 x weekly - 3 sets - 10 reps - Forward T with Counter Support  - 1 x daily - 7 x weekly - 3 sets - 10 reps - Prone Quadriceps Stretch with Strap  - 1 x daily - 7 x weekly - 1 sets - 3 reps - 45 sec - 1 min hold - Seated Knee Flexion with Anchored Resistance  - 1 x daily - 7 x weekly - 2-3 sets - 10 reps - Seated Knee Extension with Resistance  - 1 x daily - 7 x weekly - 2-3 sets - 10 reps - Lateral Step Down  - 1 x daily - 7 x weekly -  2-3 sets - 10 reps  ASSESSMENT:  CLINICAL IMPRESSION: Gilliam reports no back pain but is continuing to have localized R knee pain along lateral knee and distal ITBAnd.  He has had PT previously for Itband syndrome.   He has full pain free lumbar AROM and has met LTG #3.  Today he was limited with exercise due to R knee pain.  After manual therapy to R knee focusing on Itband reported decreased pain and soreness.  Discussed obtaining massage gun to apply to area to perform self STM at home.  Will focus remaining sessions on Itband to decrease pain, but also recommended return to Dr. Raeford Razor if pain does not improve.  Payton Newfield continues to demonstrate potential for improvement and would benefit from continued skilled therapy to address impairments.     OBJECTIVE IMPAIRMENTS: Abnormal gait, decreased endurance, decreased mobility, difficulty walking, decreased ROM, hypomobility, increased fascial restrictions, increased muscle spasms, impaired flexibility, postural dysfunction, and pain.   ACTIVITY LIMITATIONS: carrying, lifting, bending, sitting, standing, squatting, sleeping, stairs, transfers, and locomotion level  PARTICIPATION LIMITATIONS: cleaning, laundry, shopping, and community activity  PERSONAL FACTORS: Past/current experiences,  Time since onset of injury/illness/exacerbation, and 1-2 comorbidities: severe degenerative disc disease, R THA, chronic low back pain  are also affecting patient's functional outcome.   REHAB POTENTIAL: Good  CLINICAL DECISION MAKING: Evolving/moderate complexity  EVALUATION COMPLEXITY: Moderate   GOALS: Goals reviewed with patient? Yes  SHORT TERM GOALS: Target date: 01/04/2023   Patient will be independent with initial HEP.  Baseline: given Goal status: MET 12/30/2022- good compliance.   2.  Patient will report centralization of radicular symptoms.  Baseline: radiates to R knee Goal status: MET- 01/05/23  LONG TERM GOALS: Target date: 02/01/2023    Patient will be independent with advanced/ongoing HEP to improve outcomes and carryover.  Baseline:  Goal status: IN PROGRESS  2.  Patient will report 75% improvement in low back pain to improve QOL.  Baseline:  Goal status: MET- 01/05/23 75% improvement  3.  Patient will demonstrate full pain free lumbar ROM to perform ADLs.   Baseline: see objective Goal status: MET 01/25/23 - full AROM no pain.   4.  Patient will report 15% improvement on modified Oswestry to demonstrate improved functional ability.  Baseline: 62% disability  Goal status: IN PROGRESS   5.  Patient will tolerate 30 min of walking to return to golf. Baseline: unable Goal status: IN PROGRESS 01/25/23- limited by knee pain.   PLAN:  PT FREQUENCY: 1-2x/week  PT DURATION: 6 weeks  PLANNED INTERVENTIONS: Therapeutic exercises, Therapeutic activity, Neuromuscular re-education, Balance training, Gait training, Patient/Family education, Self Care, Joint mobilization, Stair training, Dry Needling, Electrical stimulation, Spinal mobilization, Cryotherapy, Moist heat, Traction, Ultrasound, Manual therapy, and Re-evaluation.  PLAN FOR NEXT SESSION: focus on R knee/IT band - try wall toe raises, isometric wall squats, etc.    Rennie Natter, PT, DPT  01/25/2023,  8:50 AM

## 2023-01-27 ENCOUNTER — Ambulatory Visit: Payer: Medicare (Managed Care)

## 2023-01-27 DIAGNOSIS — M5441 Lumbago with sciatica, right side: Secondary | ICD-10-CM | POA: Diagnosis not present

## 2023-01-27 DIAGNOSIS — G8929 Other chronic pain: Secondary | ICD-10-CM

## 2023-01-27 DIAGNOSIS — R252 Cramp and spasm: Secondary | ICD-10-CM

## 2023-01-27 NOTE — Therapy (Signed)
OUTPATIENT PHYSICAL THERAPY TREATMENT   Patient Name: Phillip Hughes MRN: [id number] DOB:May 06, 1956, 67 y.o., male Today's Date: 01/27/2023  END OF SESSION:  PT End of Session - 01/27/23 0932     Visit Number 11    Number of Visits 12    Date for PT Re-Evaluation 02/01/23    Authorization Type cigna    PT Start Time [phone]    PT Stop Time 0929    PT Time Calculation (min) 42 min    Activity Tolerance Patient tolerated treatment well    Behavior During Therapy [facility] for tasks assessed/performed                Past Medical History:  Diagnosis Date   Hypertension    Sciatica    Sinusitis    Past Surgical History:  Procedure Laterality Date   [place]     NECK SURGERY     Patient Active Problem List   Diagnosis Date Noted   Patellofemoral pain syndrome of right knee 12/01/2022   Lumbar radiculopathy 11/29/2022   Peroneal mononeuropathy, 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    PCP: Mackie Pai, PA-C  REFERRING PROVIDER: Rosemarie Ax, MD   REFERRING DIAG: M54.16 (ICD-10-CM) - Lumbar radiculopathy   Rationale for Evaluation and Treatment: Rehabilitation  THERAPY DIAG:  Chronic right-sided low back pain with right-sided sciatica  Cramp and spasm  ONSET DATE: April 2022  SUBJECTIVE:                                                                                                                                                                                           SUBJECTIVE STATEMENT: Pt notes he is still dealing with R knee pain (pointing to ITB).  PERTINENT HISTORY:  R hip replacement, ACDF, L4-5 lumbar laminectomy 2017  PAIN:  Are you having pain? Yes: NPRS scale: 5/10 Pain location: R knee Pain description: sore Aggravating factors: walking, standing Relieving factors: pain medication  PRECAUTIONS: None  WEIGHT BEARING RESTRICTIONS: No  FALLS:  Has patient fallen in last 6 months? Yes. Number of falls 1 -off ladder  while trimming bushes, ladder was on grass and tipped. No falls due to loss of balance.   LIVING ENVIRONMENT: Lives with: lives with their spouse Lives in: House/apartment Stairs: Yes: Internal: 15 steps; on right going up and to basement, has trouble going up, sometimes goes out basement door and walks around house rather than go up stairs.  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 Severe degenerative disc disease again seen at L2-3, with vacuum disc phenomenon and mild grade 1 degenerative retrolisthesis measuring approximately 4 mm. Lower lumbar facet DJD is seen bilaterally at L4-5 and L5-S1. No focal lytic or sclerotic bone lesions identified.   IMPRESSION: No acute findings.   Stable severe L2-3 degenerative disc disease and mild grade 1 degenerative retrolisthesis.   Lower lumbar facet DJD.  PATIENT SURVEYS:  Modified Oswestry 31/50 =62%   SCREENING FOR RED FLAGS: Bowel or bladder incontinence: No Spinal tumors: No Cauda equina syndrome: No Compression fracture: No Abdominal aneurysm: No  COGNITION: Overall cognitive status: Within functional limits for tasks assessed     SENSATION: WFL  MUSCLE LENGTH: Hamstrings: Right 60 deg; Left 60 deg   POSTURE: decreased lumbar lordosis  PALPATION: Tenderness R lumbar paraspinals, L2/L3.  Decreased mobility L3-5  LUMBAR ROM:   AROM eval  Flexion Almost to toes, increased knee pain  Extension Wnl, increased pain  Right lateral flexion Past knee, tight  Left lateral flexion Past knee, tight  Right rotation WNL, increased radicular pain  Left  rotation WNL increased LBP   (Blank rows = not tested)  LOWER EXTREMITY ROM:     Passive  Right eval Left eval  Hip flexion 70 70  Hip internal rotation 10 25  Hip external rotation 30 30   (Blank rows = not tested)  LOWER EXTREMITY MMT:    MMT Right eval Left eval  Hip flexion 5 5  Hip extension 5 5  Hip abduction 5 5  Hip adduction 5 5  Knee flexion 5 5  Knee extension 5 5  Ankle dorsiflexion 5 5   (Blank rows = not tested)  LUMBAR SPECIAL TESTS:  Straight leg raise test: Negative and Slump test: Negative  FUNCTIONAL TESTS:  5 times sit to stand: 16 seconds, increased LBP  GAIT: Distance walked: 50 Assistive device utilized: None Level of assistance: Complete Independence Comments: very antalgic  TODAY'S TREATMENT:                                                                                                                              DATE:  01/27/23 Therapeutic Exercise: to improve strength and mobility.  Demo, verbal and tactile cues throughout for technique. Bike L3 x 8 min Heel slide x 10 RLE Passive R quad stretch 3x15" Seated ball squeeze 10x5" Seated LAQ with ball squeeze at feet x 10 Mini wall squat 6x5"  Manual Therapy: to decrease muscle spasm and pain and improve mobility STM/TPR to R ITBand, vastus lateralis  01/25/23 Therapeutic Exercise: to improve strength and mobility.  Demo, verbal and tactile cues throughout for technique.  Bike L1 x 6 min  Straight leg Raise - supine 3 x 10 R, S/L 2 x 10 R - reported increased pain, unable to tolerated on L due to bent knee position on R Hamstring stretch with strap x 1 min - hooklying to avoid stress on low back   Itband stretch with strap x 1 min  - hooklying to avoid stress on low back Manual Therapy: to decrease muscle spasm and pain and improve mobility STM/TPR to R ITBand, vastus lateralis, IASTM with s/s tools to ITBAnd, patellar mobs.   01/20/23 Therapeutic Exercise: to improve strength and  mobility.  Demo, verbal and tactile cues throughout for technique. Bike L2 x 6 min   Golf swings x 10 - no pain Golfer's lift x 5 R, x 10 L - increased R knee pain Hip IR/ER repeated stretches in supine L knee piriformis stretches  (too painful on R today) Review HEP Manual Therapy: to decrease muscle spasm and pain and improve mobility IASTM with s/s tools to R ITBand/Vastus lateralis, patellar tendon, STM/TPR to R distal hamstrings and Itband, myofascial release to R ITBand Modalities: MHP applied to R knee x 5 min after session (not included in session time).    01/17/23 Therapeutic Exercise: to improve strength and mobility.  Demo, verbal and tactile cues throughout for technique. Elliptical L2 x 5 min  Prone quad stretch R 2 x 60 sec Prone on elbow with quad stretch with strap 2x60 sec for hip flexor stretch bil (pt unable to feel hip flexor stretch in sitting or standing) Reviewed ITB with strap. Seated modified fig 4 with foot on vertical bolster 2x30 sec Standing lumbar ext x 5   Manual Therapy: to decrease muscle spasm and pain and improve mobility STM to R lateral quads; MFR to R ITB Skilled palpation and monitoring of soft tissues during DN Trigger Point Dry-Needling  Treatment instructions: Expect mild to moderate muscle soreness. S/S of pneumothorax if dry needled over a lung field, and to seek immediate medical attention should they occur. Patient verbalized understanding of these instructions and education. Patient Consent Given: Yes Education handout provided: Yes Muscles treated: R VLO Electrical stimulation performed: No Parameters: N/A Treatment response/outcome: Twitch Response Elicited and Palpable Increase in Muscle Length   PATIENT EDUCATION:  Education details: HEP update  Person educated: Patient Education method: Explanation, Demonstration, Verbal cues, and Handouts Education comprehension: verbalized understanding and returned demonstration  HOME  EXERCISE PROGRAM: Access Code: [email or web address] URL: [email or web address] Date: 01/27/2023 Prepared by: Clarene Essex  Exercises - Prone Hip Extension  - 1 x daily - 7 x weekly - 3 sets - 10 reps - Prone Press Up On Elbows  - 1 x daily - 7 x weekly - 3 sets - 10 reps - Supine Bridge with Resistance Band  - 1 x daily - 7 x weekly - 3 sets - 10 reps - Sidelying Hip Abduction  - 1 x daily - 7 x weekly - 3 sets - 10 reps - Standing Diagonal Chop  - 1 x daily - 7 x weekly - 3 sets - 10 reps - Reverse Chop with Resistance  - 1 x daily - 7 x weekly - 3 sets - 10 reps - Forward T with Counter Support  - 1 x daily - 7 x weekly - 3 sets - 10 reps - Prone Quadriceps Stretch with Strap  - 1 x daily - 7 x weekly - 1 sets - 3 reps - 45 sec - 1 min  hold - Seated Knee Flexion with Anchored Resistance  - 1 x daily - 7 x weekly - 2-3 sets - 10 reps - Seated Knee Extension with Resistance  - 1 x daily - 7 x weekly - 2-3 sets - 10 reps - Lateral Step Down  - 1 x daily - 7 x weekly - 2-3 sets - 10 reps - Wall Quarter Squat  - 1 x daily - 7 x weekly - 2 sets - 5 reps - 5 sec hold - Bilateral Long Arc Quad  - 1 x daily - 7 x weekly - 2 sets - 10 reps  ASSESSMENT:  CLINICAL IMPRESSION: Phillip Hughes continues to c/o R knee pain. He reports that STM to his R ITB alleviates his knee pain the most, he will be getting massage gun soon to do at home. Focused exercises on VMO  targeted exercises to take load off of ITB and VL. Pt only able to do 6 reps with mini squats on wall, also added these exercises for HEP. Phillip Hughes completed session with mild report of soreness in R knee but improvement from when he initially came in. Will need to assess whether continuing or wrapping up POC next visit.   OBJECTIVE IMPAIRMENTS: Abnormal gait, decreased endurance, decreased mobility, difficulty walking, decreased ROM, hypomobility, increased fascial restrictions, increased muscle spasms, impaired flexibility, postural dysfunction,  and pain.   ACTIVITY LIMITATIONS: carrying, lifting, bending, sitting, standing, squatting, sleeping, stairs, transfers, and locomotion level  PARTICIPATION LIMITATIONS: cleaning, laundry, shopping, and community activity  PERSONAL FACTORS: Past/current experiences, Time since onset of injury/illness/exacerbation, and 1-2 comorbidities: severe degenerative disc disease, R THA, chronic low back pain  are also affecting patient's functional outcome.   REHAB POTENTIAL: Good  CLINICAL DECISION MAKING: Evolving/moderate complexity  EVALUATION COMPLEXITY: Moderate   GOALS: Goals reviewed with patient? Yes  SHORT TERM GOALS: Target date: 01/04/2023   Patient will be independent with initial HEP.  Baseline: given Goal status: MET 12/30/2022- good compliance.   2.  Patient will report centralization of radicular symptoms.  Baseline: radiates to R knee Goal status: MET- 01/05/23  LONG TERM GOALS: Target date: 02/01/2023    Patient will be independent with advanced/ongoing HEP to improve outcomes and carryover.  Baseline:  Goal status: IN PROGRESS  2.  Patient will report 75% improvement in low back pain to improve QOL.  Baseline:  Goal status: MET- 01/05/23 75% improvement  3.  Patient will demonstrate full pain free lumbar ROM to perform ADLs.   Baseline: see objective Goal status: MET 01/25/23 - full AROM no pain.   4.  Patient will report 15% improvement on modified Oswestry to demonstrate improved functional ability.  Baseline: 62% disability  Goal status: IN PROGRESS   5.  Patient will tolerate 30 min of walking to return to golf. Baseline: unable Goal status: IN PROGRESS 01/25/23- limited by knee pain.   PLAN:  PT FREQUENCY: 1-2x/week  PT DURATION: 6 weeks  PLANNED INTERVENTIONS: Therapeutic exercises, Therapeutic activity, Neuromuscular re-education, Balance training, Gait training, Patient/Family education, Self Care, Joint mobilization, Stair training, Dry Needling,  Electrical stimulation, Spinal mobilization, Cryotherapy, Moist heat, Traction, Ultrasound, Manual therapy, and Re-evaluation.  PLAN FOR NEXT SESSION: recert vs discharge; focus on R knee/IT band - try wall toe raises, isometric wall squats, etc.    Artist Pais, PTA 01/27/2023, 9:39 AM

## 2023-02-01 ENCOUNTER — Encounter: Payer: Self-pay | Admitting: Physical Therapy

## 2023-02-01 ENCOUNTER — Ambulatory Visit: Payer: Medicare (Managed Care) | Admitting: Physical Therapy

## 2023-02-01 DIAGNOSIS — G8929 Other chronic pain: Secondary | ICD-10-CM

## 2023-02-01 DIAGNOSIS — R252 Cramp and spasm: Secondary | ICD-10-CM

## 2023-02-01 DIAGNOSIS — M5441 Lumbago with sciatica, right side: Secondary | ICD-10-CM | POA: Diagnosis not present

## 2023-02-01 NOTE — Therapy (Signed)
OUTPATIENT PHYSICAL THERAPY TREATMENT Progress Note Reporting Period 12/21/2022 to 02/01/23  See note below for Objective Data and Assessment of Progress/Goals.      Patient Name: Phillip Hughes MRN: [id number] DOB:12/24/1955, 67 y.o., male Today's Date: 02/01/2023  END OF SESSION:  PT End of Session - 02/01/23 0849     Visit Number 12    Number of Visits 20    Date for PT Re-Evaluation 03/01/23    Authorization Type cigna    PT Start Time [phone]    PT Stop Time 0932    PT Time Calculation (min) 43 min    Activity Tolerance Patient tolerated treatment well    Behavior During Therapy [facility] for tasks assessed/performed                Past Medical History:  Diagnosis Date   Hypertension    Sciatica    Sinusitis    Past Surgical History:  Procedure Laterality Date   BACK SURGERY     HERNIA REPAIR     HIP SURGERY     NECK SURGERY     Patient Active Problem List   Diagnosis Date Noted   Patellofemoral pain syndrome of right knee 12/01/2022   Lumbar radiculopathy 11/29/2022   Peroneal mononeuropathy, 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    PCP: Mackie Pai, PA-C  REFERRING PROVIDER: Rosemarie Ax, MD   REFERRING DIAG: M54.16 (ICD-10-CM) - Lumbar radiculopathy   Rationale for Evaluation and Treatment: Rehabilitation  THERAPY DIAG:  Chronic pain of right knee  Chronic right-sided low back pain with right-sided sciatica  Cramp and spasm  ONSET DATE: April 2022  SUBJECTIVE:                                                                                                                                                                                           SUBJECTIVE STATEMENT: Pt reports he had made progress with back, but still having pain with Itband, still has lateral knee and shin aching, feels tight.  Not having any back pain.   PERTINENT HISTORY:  R hip replacement, ACDF, L4-5 lumbar laminectomy 2017  PAIN:  Are you having pain? Yes: NPRS scale: 4/10 Pain  location: R knee Pain description: sore Aggravating factors: walking, standing Relieving factors: pain medication  PRECAUTIONS: None  WEIGHT BEARING RESTRICTIONS: No  FALLS:  Has patient fallen in last 6 months? Yes. Number of falls 1 -off ladder while trimming bushes, ladder was on grass and tipped. No falls due to loss of balance.   LIVING ENVIRONMENT: Lives with: lives with their spouse Lives in: House/apartment Stairs: Yes: Internal: 15 steps; on right going up and  to basement, has trouble going up, sometimes goes out basement door and walks around house rather than go up stairs.  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 Severe degenerative disc disease again seen at L2-3, with vacuum disc phenomenon and mild grade 1 degenerative retrolisthesis measuring approximately 4 mm. Lower lumbar facet DJD is seen bilaterally at L4-5 and L5-S1. No focal lytic or sclerotic bone lesions identified.   IMPRESSION: No acute findings.   Stable severe L2-3 degenerative disc disease and mild grade 1 degenerative retrolisthesis.   Lower lumbar facet DJD.  PATIENT SURVEYS:  Modified Oswestry 31/50 =62%   SCREENING FOR RED FLAGS: Bowel or bladder incontinence: No Spinal tumors: No Cauda equina syndrome: No Compression fracture: No Abdominal aneurysm: No  COGNITION: Overall cognitive status: Within functional limits for tasks assessed     SENSATION: WFL  MUSCLE LENGTH: Hamstrings: Right 60 deg; Left 60 deg   POSTURE: decreased lumbar lordosis  PALPATION: Tenderness R lumbar paraspinals, L2/L3.  Decreased  mobility L3-5  LUMBAR ROM:   AROM eval  Flexion Almost to toes, increased knee pain  Extension Wnl, increased pain  Right lateral flexion Past knee, tight  Left lateral flexion Past knee, tight  Right rotation WNL, increased radicular pain  Left rotation WNL increased LBP   (Blank rows = not tested)  LOWER EXTREMITY ROM:     Passive  Right eval Left eval  Hip flexion 70 70  Hip internal rotation 10 25  Hip external rotation 30 30   (Blank rows = not tested)  LOWER EXTREMITY MMT:    MMT Right eval Left eval  Hip flexion 5 5  Hip extension 5 5  Hip abduction 5 5  Hip adduction 5 5  Knee flexion 5 5  Knee extension 5 5  Ankle dorsiflexion 5 5   (Blank rows = not tested)  LUMBAR SPECIAL TESTS:  Straight leg raise test: Negative and Slump test: Negative  FUNCTIONAL TESTS:  5 times sit to stand: 16 seconds, increased LBP  GAIT: Distance walked: 50 Assistive device utilized: None Level of assistance: Complete Independence Comments: very antalgic  TODAY'S TREATMENT:                                                                                                                              DATE:  02/01/23 Therapeutic Exercise: to improve strength and mobility.  Demo, verbal and tactile cues throughout for technique. Bike L3 x 6 min  Itband stretches- seated and sidelying Wall squats 2 x 15 sec hold SL hip abduction  3 x  73  Squats with BTB x 10 Therapeutic Activity:  review of goals, progress and Oswestry Manual Therapy: to decrease muscle spasm and pain and improve mobility. STM/TPR to R ITBand, vastus lateralis, peroneals, mobs to fibular head, IASTM to Itband.   01/27/23 Therapeutic Exercise: to improve strength and mobility.  Demo, verbal and tactile cues throughout for technique. Bike L3 x 8 min Heel slide x 10 RLE Passive R quad stretch 3x15" Seated ball squeeze 10x5" Seated LAQ with ball squeeze at feet x 10 Mini wall squat 6x5"  Manual Therapy: to  decrease muscle spasm and pain and improve mobility STM/TPR to R ITBand, vastus lateralis  01/25/23 Therapeutic Exercise: to improve strength and mobility.  Demo, verbal and tactile cues throughout for technique. Bike L1 x 6 min  Straight leg Raise - supine 3 x 10 R, S/L 2 x 10 R - reported increased pain, unable to tolerated on L due to bent knee position on R Hamstring stretch with strap x 1 min - hooklying to avoid stress on low back   Itband stretch with strap x 1 min  - hooklying to avoid stress on low back Manual Therapy: to decrease muscle spasm and pain and improve mobility STM/TPR to R ITBand, vastus lateralis, IASTM with s/s tools to ITBAnd, patellar mobs.   01/20/23 Therapeutic Exercise: to improve strength and mobility.  Demo, verbal and tactile cues throughout for technique. Bike L2 x 6 min   Golf swings x 10 - no pain Golfer's lift x 5 R, x 10 L - increased R knee pain Hip IR/ER repeated stretches in supine L knee piriformis stretches  (too painful on R today) Review HEP Manual Therapy: to decrease muscle spasm and pain and improve mobility IASTM with s/s tools to R ITBand/Vastus lateralis, patellar tendon, STM/TPR to R distal hamstrings and Itband, myofascial release to R ITBand Modalities: MHP applied to R knee x 5 min after session (not included in session time).    01/17/23 Therapeutic Exercise: to improve strength and mobility.  Demo, verbal and tactile cues throughout for technique. Elliptical L2 x 5 min  Prone quad stretch R 2 x 60 sec Prone on elbow with quad stretch with strap 2x60 sec for hip flexor stretch bil (pt unable to feel hip flexor stretch in sitting or standing) Reviewed ITB with strap. Seated modified fig 4 with foot on vertical bolster 2x30 sec Standing lumbar ext x 5   Manual Therapy: to decrease muscle spasm and pain and improve mobility STM to R lateral quads; MFR to R ITB Skilled palpation and monitoring of soft tissues during DN Trigger Point  Dry-Needling  Treatment instructions: Expect mild to moderate muscle soreness. S/S of pneumothorax if dry needled over a lung field, and to seek immediate medical attention should they occur. Patient verbalized understanding of these instructions and education. Patient Consent Given: Yes Education handout provided: Yes Muscles treated: R VLO Electrical stimulation performed: No Parameters: N/A Treatment response/outcome: Twitch Response Elicited and Palpable Increase in Muscle Length   PATIENT EDUCATION:  Education details: HEP update  Person educated: Patient Education method: Explanation, Demonstration, Verbal cues, and Handouts Education comprehension: verbalized understanding and returned demonstration  HOME EXERCISE PROGRAM: Access Code: [email or web address] URL: [email or web address] Date: 02/01/2023 Prepared by: Glenetta Hew  Exercises - Prone Hip Extension  - 1 x daily - 7 x weekly - 3 sets - 10 reps - Prone Press Up On Elbows  - 1 x daily - 7 x weekly - 3 sets - 10  reps - Supine Bridge with Resistance Band  - 1 x daily - 7 x weekly - 3 sets - 10 reps - Sidelying Hip Abduction  - 1 x daily - 7 x weekly - 3 sets - 10 reps - Standing Diagonal Chop  - 1 x daily - 7 x weekly - 3 sets - 10 reps - Reverse Chop with Resistance  - 1 x daily - 7 x weekly - 3 sets - 10 reps - Forward T with Counter Support  - 1 x daily - 7 x weekly - 3 sets - 10 reps - Prone Quadriceps Stretch with Strap  - 1 x daily - 7 x weekly - 1 sets - 3 reps - 45 sec - 1 min hold - Seated Knee Flexion with Anchored Resistance  - 1 x daily - 7 x weekly - 2-3 sets - 10 reps - Seated Knee Extension with Resistance  - 1 x daily - 7 x weekly - 2-3 sets - 10 reps - Lateral Step Down  - 1 x daily - 7 x weekly - 2-3 sets - 10 reps - Wall Quarter Squat  - 1 x daily - 7 x weekly - 2 sets - 5 reps - 15 sec hold - Bilateral Long Arc Quad  - 1 x daily - 7 x weekly - 2 sets - 10 reps - Supine ITB Stretch with Strap   - 1 x daily - 7 x weekly - 3 sets - 10 reps - Sidelying TFL Stretch  - 1 x daily - 7 x weekly - 1 sets - 10 reps - Sidelying Diagonal Hip Abduction  - 1 x daily - 7 x weekly - 3 sets - 10 reps  ASSESSMENT:  CLINICAL IMPRESSION: Phillip Hughes reports resolution of low back pain and has met LTG #1 -4 and reports only 30% disability compared to 62% on IE on modified Oswestry.  He continues to report continued R knee pain/ITBand tightness limiting his ability to walk and return to golf and would like to continue therapy to address this impairment.  He demonstrates weakness in R hip abduction and was extremely challenged today with s/L hip abduction.  Extending POC for additional 2x/week for 4 weeks to continue to address R knee pain in order to decrease pain and return to all desired activities without limitation.     OBJECTIVE IMPAIRMENTS: Abnormal gait, decreased endurance, decreased mobility, difficulty walking, decreased ROM, hypomobility, increased fascial restrictions, increased muscle spasms, impaired flexibility, postural dysfunction, and pain.   ACTIVITY LIMITATIONS: carrying, lifting, bending, sitting, standing, squatting, sleeping, stairs, transfers, and locomotion level  PARTICIPATION LIMITATIONS: cleaning, laundry, shopping, and community activity  PERSONAL FACTORS: Past/current experiences, Time since onset of injury/illness/exacerbation, and 1-2 comorbidities: severe degenerative disc disease, R THA, chronic low back pain  are also affecting patient's functional outcome.   REHAB POTENTIAL: Good  CLINICAL DECISION MAKING: Evolving/moderate complexity  EVALUATION COMPLEXITY: Moderate   GOALS: Goals reviewed with patient? Yes  SHORT TERM GOALS: Target date: 01/04/2023   Patient will be independent with initial HEP.  Baseline: given Goal status: MET 12/30/2022- good compliance.   2.  Patient will report centralization of radicular symptoms.  Baseline: radiates to R knee Goal  status: MET- 01/05/23  LONG TERM GOALS: Target date: 02/01/2023  extended to 03/01/2023  Patient will be independent with advanced/ongoing HEP to improve outcomes and carryover.  Baseline:  Goal status: MET 02/01/23 - met for current.   2.  Patient will report 75% improvement  in low back pain to improve QOL.  Baseline:  Goal status: MET- 01/05/23 75% improvement  3.  Patient will demonstrate full pain free lumbar ROM to perform ADLs.   Baseline: see objective Goal status: MET 01/25/23 - full AROM no pain.   4.  Patient will report 15% improvement on modified Oswestry to demonstrate improved functional ability.  Baseline: 62% disability  Goal status: MET 30%   5.  Patient will tolerate 30 min of walking to return to golf. Baseline: unable Goal status: IN PROGRESS 01/25/23- limited by knee pain.   PLAN:  PT FREQUENCY: 1-2x/week  PT DURATION: 4 weeks  PLANNED INTERVENTIONS: Therapeutic exercises, Therapeutic activity, Neuromuscular re-education, Balance training, Gait training, Patient/Family education, Self Care, Joint mobilization, Stair training, Dry Needling, Electrical stimulation, Spinal mobilization, Cryotherapy, Moist heat, Traction, Ultrasound, Manual therapy, and Re-evaluation.  PLAN FOR NEXT SESSION: focus on R knee/IT band - hip abductor/glut med strengthening   Rennie Natter, PT, DPT  02/01/2023, 10:48 AM

## 2023-02-02 NOTE — Therapy (Signed)
OUTPATIENT PHYSICAL THERAPY TREATMENT Progress Note Reporting Period 12/21/2022 to 02/01/23  See note below for Objective Data and Assessment of Progress/Goals.      Patient Name: Phillip Hughes MRN: [id number] DOB:[birth year], 66 y.o., male Today's Date: 02/01/2023  END OF SESSION:  PT End of Session - 02/01/23 0849     Visit Number 12    Number of Visits 20    Date for PT Re-Evaluation 03/01/23    Authorization Type cigna    PT Start Time [phone]    PT Stop Time 0932    PT Time Calculation (min) 43 min    Activity Tolerance Patient tolerated treatment well    Behavior During Therapy [facility] for tasks assessed/performed                Past Medical History:  Diagnosis Date   Hypertension    Sciatica    Sinusitis    Past Surgical History:  Procedure Laterality Date   BACK SURGERY     HERNIA REPAIR     HIP SURGERY     NECK SURGERY     Patient Active Problem List   Diagnosis Date Noted   Patellofemoral pain syndrome of right knee 12/01/2022   Lumbar radiculopathy 11/29/2022   Peroneal mononeuropathy, 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    PCP: Mackie Pai, PA-C  REFERRING PROVIDER: Rosemarie Ax, MD   REFERRING DIAG: M54.16 (ICD-10-CM) - Lumbar radiculopathy   Rationale for Evaluation and Treatment: Rehabilitation  THERAPY DIAG:  Chronic pain of right knee  Chronic right-sided low back pain with right-sided sciatica  Cramp and spasm  ONSET DATE: April 2022  SUBJECTIVE:                                                                                                                                                                                           SUBJECTIVE STATEMENT: ***  PERTINENT HISTORY:  R hip replacement, ACDF, L4-5 lumbar laminectomy 2017  PAIN:  Are you having pain? Yes: NPRS scale: 4/10 Pain location: R knee Pain description: sore Aggravating factors: walking, standing Relieving factors: pain medication  PRECAUTIONS: None  WEIGHT BEARING  RESTRICTIONS: No  FALLS:  Has patient fallen in last 6 months? Yes. Number of falls 1 -off ladder while trimming bushes, ladder was on grass and tipped. No falls due to loss of balance.   LIVING ENVIRONMENT: Lives with: lives with their spouse Lives in: House/apartment Stairs: Yes: Internal: 15 steps; on right going up and to basement, has trouble going up, sometimes goes out basement door and walks around house rather than go up stairs.  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 Severe degenerative disc disease again seen at L2-3, with vacuum disc phenomenon and mild grade 1 degenerative retrolisthesis measuring approximately 4 mm. Lower lumbar facet DJD is seen bilaterally at L4-5 and L5-S1. No focal lytic or sclerotic bone lesions identified.   IMPRESSION: No acute findings.   Stable severe L2-3 degenerative disc disease and mild grade 1 degenerative retrolisthesis.   Lower lumbar facet DJD.  PATIENT SURVEYS:  Modified Oswestry 31/50 =62%   SCREENING FOR RED FLAGS: Bowel or bladder incontinence: No Spinal tumors: No Cauda equina syndrome: No Compression fracture: No Abdominal aneurysm: No  COGNITION: Overall cognitive status: Within functional limits for tasks assessed     SENSATION: WFL  MUSCLE LENGTH: Hamstrings: Right 60 deg; Left 60 deg   POSTURE: decreased lumbar lordosis  PALPATION: Tenderness R lumbar paraspinals, L2/L3.  Decreased mobility L3-5  LUMBAR ROM:   AROM eval  Flexion Almost to toes, increased knee pain  Extension Wnl, increased pain  Right lateral flexion Past knee,  tight  Left lateral flexion Past knee, tight  Right rotation WNL, increased radicular pain  Left rotation WNL increased LBP   (Blank rows = not tested)  LOWER EXTREMITY ROM:     Passive  Right eval Left eval  Hip flexion 70 70  Hip internal rotation 10 25  Hip external rotation 30 30   (Blank rows = not tested)  LOWER EXTREMITY MMT:    MMT Right eval Left eval  Hip flexion 5 5  Hip extension 5 5  Hip abduction 5 5  Hip adduction 5 5  Knee flexion 5 5  Knee extension 5 5  Ankle dorsiflexion 5 5   (Blank rows = not tested)  LUMBAR SPECIAL TESTS:  Straight leg raise test: Negative and Slump test: Negative  FUNCTIONAL TESTS:  5 times sit to stand: 16 seconds, increased LBP  GAIT: Distance walked: 50 Assistive device utilized: None Level of assistance: Complete Independence Comments: very antalgic  TODAY'S TREATMENT:                                                                                                                              DATE:   02/03/23 Therapeutic Exercise: to improve strength and mobility.  Demo, verbal and tactile cues throughout for technique. Bike L3 x 6 min  Itband stretches- seated and sidelying Wall squats 2 x 15 sec hold SL hip abduction  3 x 10  Squats with BTB x 10 Therapeutic Activity:  review of goals, progress and Oswestry Manual Therapy: to decrease muscle spasm and pain and improve mobility. STM/TPR  to R ITBand, vastus lateralis, peroneals, mobs to fibular head, IASTM to Itband.   02/01/23 Therapeutic Exercise: to improve strength and mobility.  Demo, verbal and tactile cues throughout for technique. Bike L3 x 6 min  Itband stretches- seated and sidelying Wall squats 2 x 15 sec hold SL hip abduction  3 x 10  Squats with BTB x 10 Therapeutic Activity:  review of goals, progress and Oswestry Manual Therapy: to decrease muscle spasm and pain and improve mobility. STM/TPR to R ITBand, vastus lateralis, peroneals, mobs to fibular  head, IASTM to Itband.   01/27/23 Therapeutic Exercise: to improve strength and mobility.  Demo, verbal and tactile cues throughout for technique. Bike L3 x 8 min Heel slide x 10 RLE Passive R quad stretch 3x15" Seated ball squeeze 10x5" Seated LAQ with ball squeeze at feet x 10 Mini wall squat 6x5"  Manual Therapy: to decrease muscle spasm and pain and improve mobility STM/TPR to R ITBand, vastus lateralis  01/25/23 Therapeutic Exercise: to improve strength and mobility.  Demo, verbal and tactile cues throughout for technique. Bike L1 x 6 min  Straight leg Raise - supine 3 x 10 R, S/L 2 x 10 R - reported increased pain, unable to tolerated on L due to bent knee position on R Hamstring stretch with strap x 1 min - hooklying to avoid stress on low back   Itband stretch with strap x 1 min  - hooklying to avoid stress on low back Manual Therapy: to decrease muscle spasm and pain and improve mobility STM/TPR to R ITBand, vastus lateralis, IASTM with s/s tools to ITBAnd, patellar mobs.    PATIENT EDUCATION:  Education details: HEP update  Person educated: Patient Education method: Explanation, Demonstration, Verbal cues, and Handouts Education comprehension: verbalized understanding and returned demonstration  HOME EXERCISE PROGRAM: Access Code: [email or web address] URL: [email or web address] Date: 02/01/2023 Prepared by: Glenetta Hew  Exercises - Prone Hip Extension  - 1 x daily - 7 x weekly - 3 sets - 10 reps - Prone Press Up On Elbows  - 1 x daily - 7 x weekly - 3 sets - 10 reps - Supine Bridge with Resistance Band  - 1 x daily - 7 x weekly - 3 sets - 10 reps - Sidelying Hip Abduction  - 1 x daily - 7 x weekly - 3 sets - 10 reps - Standing Diagonal Chop  - 1 x daily - 7 x weekly - 3 sets - 10 reps - Reverse Chop with Resistance  - 1 x daily - 7 x weekly - 3 sets - 10 reps - Forward T with Counter Support  - 1 x daily - 7 x weekly - 3 sets - 10 reps - Prone Quadriceps  Stretch with Strap  - 1 x daily - 7 x weekly - 1 sets - 3 reps - 45 sec - 1 min hold - Seated Knee Flexion with Anchored Resistance  - 1 x daily - 7 x weekly - 2-3 sets - 10 reps - Seated Knee Extension with Resistance  - 1 x daily - 7 x weekly - 2-3 sets - 10 reps - Lateral Step Down  - 1 x daily - 7 x weekly - 2-3 sets - 10 reps - Wall Quarter Squat  - 1 x daily - 7 x weekly - 2 sets - 5 reps - 15 sec hold - Bilateral Long Arc Quad  - 1 x daily - 7 x weekly - 2 sets - 10 reps -  Supine ITB Stretch with Strap  - 1 x daily - 7 x weekly - 3 sets - 10 reps - Sidelying TFL Stretch  - 1 x daily - 7 x weekly - 1 sets - 10 reps - Sidelying Diagonal Hip Abduction  - 1 x daily - 7 x weekly - 3 sets - 10 reps  ASSESSMENT:  CLINICAL IMPRESSION: ***     OBJECTIVE IMPAIRMENTS: Abnormal gait, decreased endurance, decreased mobility, difficulty walking, decreased ROM, hypomobility, increased fascial restrictions, increased muscle spasms, impaired flexibility, postural dysfunction, and pain.   ACTIVITY LIMITATIONS: carrying, lifting, bending, sitting, standing, squatting, sleeping, stairs, transfers, and locomotion level  PARTICIPATION LIMITATIONS: cleaning, laundry, shopping, and community activity  PERSONAL FACTORS: Past/current experiences, Time since onset of injury/illness/exacerbation, and 1-2 comorbidities: severe degenerative disc disease, R THA, chronic low back pain  are also affecting patient's functional outcome.   REHAB POTENTIAL: Good  CLINICAL DECISION MAKING: Evolving/moderate complexity  EVALUATION COMPLEXITY: Moderate   GOALS: Goals reviewed with patient? Yes  SHORT TERM GOALS: Target date: 01/04/2023   Patient will be independent with initial HEP.  Baseline: given Goal status: MET 12/30/2022- good compliance.   2.  Patient will report centralization of radicular symptoms.  Baseline: radiates to R knee Goal status: MET- 01/05/23  LONG TERM GOALS: Target date: 02/01/2023   extended to 03/01/2023  Patient will be independent with advanced/ongoing HEP to improve outcomes and carryover.  Baseline:  Goal status: MET 02/01/23 - met for current.   2.  Patient will report 75% improvement in low back pain to improve QOL.  Baseline:  Goal status: MET- 01/05/23 75% improvement  3.  Patient will demonstrate full pain free lumbar ROM to perform ADLs.   Baseline: see objective Goal status: MET 01/25/23 - full AROM no pain.   4.  Patient will report 15% improvement on modified Oswestry to demonstrate improved functional ability.  Baseline: 62% disability  Goal status: MET 30%   5.  Patient will tolerate 30 min of walking to return to golf. Baseline: unable Goal status: IN PROGRESS 01/25/23- limited by knee pain.   PLAN:  PT FREQUENCY: 1-2x/week  PT DURATION: 4 weeks  PLANNED INTERVENTIONS: Therapeutic exercises, Therapeutic activity, Neuromuscular re-education, Balance training, Gait training, Patient/Family education, Self Care, Joint mobilization, Stair training, Dry Needling, Electrical stimulation, Spinal mobilization, Cryotherapy, Moist heat, Traction, Ultrasound, Manual therapy, and Re-evaluation.  PLAN FOR NEXT SESSION: focus on R knee/IT band - hip abductor/glut med strengthening   Rennie Natter, PT 02/01/2023, 10:48 AM

## 2023-02-03 ENCOUNTER — Encounter: Payer: Self-pay | Admitting: Physical Therapy

## 2023-02-03 ENCOUNTER — Ambulatory Visit: Payer: Medicare (Managed Care) | Admitting: Physical Therapy

## 2023-02-03 DIAGNOSIS — M5441 Lumbago with sciatica, right side: Secondary | ICD-10-CM | POA: Diagnosis not present

## 2023-02-03 DIAGNOSIS — G8929 Other chronic pain: Secondary | ICD-10-CM

## 2023-02-03 DIAGNOSIS — R252 Cramp and spasm: Secondary | ICD-10-CM

## 2023-02-08 ENCOUNTER — Ambulatory Visit: Payer: Medicare (Managed Care)

## 2023-02-08 ENCOUNTER — Ambulatory Visit: Payer: Self-pay | Admitting: Surgery

## 2023-02-08 DIAGNOSIS — R252 Cramp and spasm: Secondary | ICD-10-CM

## 2023-02-08 DIAGNOSIS — R2231 Localized swelling, mass and lump, right upper limb: Secondary | ICD-10-CM | POA: Diagnosis not present

## 2023-02-08 DIAGNOSIS — G8929 Other chronic pain: Secondary | ICD-10-CM

## 2023-02-08 DIAGNOSIS — M5441 Lumbago with sciatica, right side: Secondary | ICD-10-CM | POA: Diagnosis not present

## 2023-02-08 NOTE — Therapy (Signed)
OUTPATIENT PHYSICAL THERAPY TREATMENT       Patient Name: Phillip Hughes MRN: [id number] DOB:[birth year], 67 y.o., male Today's Date: 02/08/2023  END OF SESSION:  PT End of Session - 02/08/23 1750     Visit Number 14    Number of Visits 20    Date for PT Re-Evaluation 03/01/23    Authorization Type cigna    PT Start Time 1700    PT Stop Time [id number]    PT Time Calculation (min) 43 min    Activity Tolerance Patient tolerated treatment well    Behavior During Therapy WFL for tasks assessed/performed                 Past Medical History:  Diagnosis Date   Hypertension    Sciatica    Sinusitis    Past Surgical History:  Procedure Laterality Date   BACK SURGERY     HERNIA REPAIR     HIP SURGERY     NECK SURGERY     Patient Active Problem List   Diagnosis Date Noted   Patellofemoral pain syndrome of right knee 12/01/2022   Lumbar radiculopathy 11/29/2022   Peroneal mononeuropathy, 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    PCP: Mackie Pai, PA-C  REFERRING PROVIDER: Rosemarie Ax, MD   REFERRING DIAG: M54.16 (ICD-10-CM) - Lumbar radiculopathy   Rationale for Evaluation and Treatment: Rehabilitation  THERAPY DIAG:  Chronic right-sided low back pain with right-sided sciatica  Cramp and spasm  Chronic pain of right knee  ONSET DATE: April 2022  SUBJECTIVE:                                                                                                                                                                                           SUBJECTIVE STATEMENT: Not much different with R knee, wasn't able to sleep last because of it.   PERTINENT HISTORY:  R hip replacement, ACDF, L4-5 lumbar laminectomy 2017  PAIN:  Are you having pain? Yes: NPRS scale: 5/10 Pain location: R knee Pain description: sore Aggravating factors: walking, standing Relieving factors: pain medication  PRECAUTIONS: None  WEIGHT BEARING RESTRICTIONS: No  FALLS:  Has patient fallen in  last 6 months? Yes. Number of falls 1 -off ladder while trimming bushes, ladder was on grass and tipped. No falls due to loss of balance.   LIVING ENVIRONMENT: Lives with: lives with their spouse Lives in: House/apartment Stairs: Yes: Internal: 15 steps; on right going up and to basement, has trouble going up, sometimes goes out basement door and walks around house rather than go up stairs.  Has following equipment at home: Single point cane and Walker -  2 wheeled  OCCUPATION: retired  PLOF: Independent, Psychologist, occupational work at [employer] to be able to play golf, had to stop playing golf due to pain  PATIENT GOALS: decrease pain, be able to play golf again  NEXT MD VISIT: 12/31/2022  OBJECTIVE:   DIAGNOSTIC FINDINGS:  05/28/2022 DG lumbar spine FINDINGS: There is no evidence of lumbar spine fracture. Severe degenerative disc disease again seen at L2-3, with vacuum disc phenomenon and mild grade 1 degenerative retrolisthesis measuring approximately 4 mm. Lower lumbar facet DJD is seen bilaterally at L4-5 and L5-S1. No focal lytic or sclerotic bone lesions identified.   IMPRESSION: No acute findings.   Stable severe L2-3 degenerative disc disease and mild grade 1 degenerative retrolisthesis.   Lower lumbar facet DJD.  PATIENT SURVEYS:  Modified Oswestry 31/50 =62%   SCREENING FOR RED FLAGS: Bowel or bladder incontinence: No Spinal tumors: No Cauda equina syndrome: No Compression fracture: No Abdominal aneurysm: No  COGNITION: Overall cognitive status: Within functional limits for tasks assessed     SENSATION: WFL  MUSCLE LENGTH: Hamstrings: Right 60 deg; Left 60 deg   POSTURE: decreased lumbar lordosis  PALPATION: Tenderness R lumbar paraspinals, L2/L3.  Decreased mobility L3-5   LUMBAR ROM:   AROM eval  Flexion Almost to toes, increased knee pain  Extension Wnl, increased pain  Right lateral flexion Past knee, tight  Left lateral flexion Past knee, tight   Right rotation WNL, increased radicular pain  Left rotation WNL increased LBP   (Blank rows = not tested)  LOWER EXTREMITY ROM:     Passive  Right eval Left eval  Hip flexion 70 70  Hip internal rotation 10 25  Hip external rotation 30 30   (Blank rows = not tested)  LOWER EXTREMITY MMT:    MMT Right eval Left eval  Hip flexion 5 5  Hip extension 5 5  Hip abduction 5 5  Hip adduction 5 5  Knee flexion 5 5  Knee extension 5 5  Ankle dorsiflexion 5 5   (Blank rows = not tested)  LUMBAR SPECIAL TESTS:  Straight leg raise test: Negative and Slump test: Negative  FUNCTIONAL TESTS:  5 times sit to stand: 16 seconds, increased LBP  GAIT: Distance walked: 50 Assistive device utilized: None Level of assistance: Complete Independence Comments: very antalgic  TODAY'S TREATMENT:                                                                                                                              DATE:  02/08/23 Therapeutic Exercise: to improve strength and mobility.  Demo, verbal and tactile cues throughout for technique. Bike L3 x 6 min  Seated ball squeeze 2x10 R side lunge with UE support x 10 R SLS 3x20 sec S/L R hip abduction x 10 S/L R hip abduction to ext x 10   Manual Therapy: to decrease muscle spasm and pain and improve mobility. STM to R ITB, RF, vastus  lateralis  02/03/23 Therapeutic Exercise: to improve strength and mobility.  Demo, verbal and tactile cues throughout for technique. Bike L3 x 6 min  HS  and ITB stretch with strap 2 x 1 min each TFL stretch in L SDLY x 1 min SL hip abduction  3 x 10  SL hip circles CW/CCW, large bicycle and toe taps front and back each to fatigue  Manual Therapy: to decrease muscle spasm and pain and improve mobility. IASTM with EDGE tool to R ITB, RF, vastus lateralis, peroneals  02/01/23 Therapeutic Exercise: to improve strength and mobility.  Demo, verbal and tactile cues throughout for technique. Bike L3 x 6  min  Itband stretches- seated and sidelying Wall squats 2 x 15 sec hold SL hip abduction  3 x 10  Squats with BTB x 10 Therapeutic Activity:  review of goals, progress and Oswestry Manual Therapy: to decrease muscle spasm and pain and improve mobility. STM/TPR to R ITBand, vastus lateralis, peroneals, mobs to fibular head, IASTM to Itband.   01/27/23 Therapeutic Exercise: to improve strength and mobility.  Demo, verbal and tactile cues throughout for technique. Bike L3 x 8 min Heel slide x 10 RLE Passive R quad stretch 3x15" Seated ball squeeze 10x5" Seated LAQ with ball squeeze at feet x 10 Mini wall squat 6x5"  Manual Therapy: to decrease muscle spasm and pain and improve mobility STM/TPR to R ITBand, vastus lateralis  01/25/23 Therapeutic Exercise: to improve strength and mobility.  Demo, verbal and tactile cues throughout for technique. Bike L1 x 6 min  Straight leg Raise - supine 3 x 10 R, S/L 2 x 10 R - reported increased pain, unable to tolerated on L due to bent knee position on R Hamstring stretch with strap x 1 min - hooklying to avoid stress on low back   Itband stretch with strap x 1 min  - hooklying to avoid stress on low back Manual Therapy: to decrease muscle spasm and pain and improve mobility STM/TPR to R ITBand, vastus lateralis, IASTM with s/s tools to ITBAnd, patellar mobs.    PATIENT EDUCATION:  Education details: HEP update  Person educated: Patient Education method: Explanation, Demonstration, Verbal cues, and Handouts Education comprehension: verbalized understanding and returned demonstration  HOME EXERCISE PROGRAM: Access Code: [email or web address] URL: [email or web address] Date: 02/01/2023 Prepared by: Glenetta Hew  Exercises - Prone Hip Extension  - 1 x daily - 7 x weekly - 3 sets - 10 reps - Prone Press Up On Elbows  - 1 x daily - 7 x weekly - 3 sets - 10 reps - Supine Bridge with Resistance Band  - 1 x daily - 7 x weekly - 3 sets - 10  reps - Sidelying Hip Abduction  - 1 x daily - 7 x weekly - 3 sets - 10 reps - Standing Diagonal Chop  - 1 x daily - 7 x weekly - 3 sets - 10 reps - Reverse Chop with Resistance  - 1 x daily - 7 x weekly - 3 sets - 10 reps - Forward T with Counter Support  - 1 x daily - 7 x weekly - 3 sets - 10 reps - Prone Quadriceps Stretch with Strap  - 1 x daily - 7 x weekly - 1 sets - 3 reps - 45 sec - 1 min hold - Seated Knee Flexion with Anchored Resistance  - 1 x daily - 7 x weekly - 2-3 sets - 10 reps - Seated Knee Extension with Resistance  -  1 x daily - 7 x weekly - 2-3 sets - 10 reps - Lateral Step Down  - 1 x daily - 7 x weekly - 2-3 sets - 10 reps - Wall Quarter Squat  - 1 x daily - 7 x weekly - 2 sets - 5 reps - 15 sec hold - Bilateral Long Arc Quad  - 1 x daily - 7 x weekly - 2 sets - 10 reps - Supine ITB Stretch with Strap  - 1 x daily - 7 x weekly - 3 sets - 10 reps - Sidelying TFL Stretch  - 1 x daily - 7 x weekly - 1 sets - 10 reps - Sidelying Diagonal Hip Abduction  - 1 x daily - 7 x weekly - 3 sets - 10 reps  ASSESSMENT:  CLINICAL IMPRESSION:  Pt reports not being able to sleep last night d/t pain in R knee. Continued with STM to decrease pain and improve muscle extensibility. Continued to focus on hip ABD strength. Tactile cues required in S/L to remain on side to target glute med. By the end of the session he was very fatigued in the glute med. Educated on using heat, TENS, and STM before HEP to improve exercise tolerance.     OBJECTIVE IMPAIRMENTS: Abnormal gait, decreased endurance, decreased mobility, difficulty walking, decreased ROM, hypomobility, increased fascial restrictions, increased muscle spasms, impaired flexibility, postural dysfunction, and pain.   ACTIVITY LIMITATIONS: carrying, lifting, bending, sitting, standing, squatting, sleeping, stairs, transfers, and locomotion level  PARTICIPATION LIMITATIONS: cleaning, laundry, shopping, and community activity  PERSONAL  FACTORS: Past/current experiences, Time since onset of injury/illness/exacerbation, and 1-2 comorbidities: severe degenerative disc disease, R THA, chronic low back pain  are also affecting patient's functional outcome.   REHAB POTENTIAL: Good  CLINICAL DECISION MAKING: Evolving/moderate complexity  EVALUATION COMPLEXITY: Moderate   GOALS: Goals reviewed with patient? Yes  SHORT TERM GOALS: Target date: 01/04/2023   Patient will be independent with initial HEP.  Baseline: given Goal status: MET 12/30/2022- good compliance.   2.  Patient will report centralization of radicular symptoms.  Baseline: radiates to R knee Goal status: MET- 01/05/23  LONG TERM GOALS: Target date: 02/01/2023  extended to 03/01/2023  Patient will be independent with advanced/ongoing HEP to improve outcomes and carryover.  Baseline:  Goal status: MET 02/01/23 - met for current.   2.  Patient will report 75% improvement in low back pain to improve QOL.  Baseline:  Goal status: MET- 01/05/23 75% improvement  3.  Patient will demonstrate full pain free lumbar ROM to perform ADLs.   Baseline: see objective Goal status: MET 01/25/23 - full AROM no pain.   4.  Patient will report 15% improvement on modified Oswestry to demonstrate improved functional ability.  Baseline: 62% disability  Goal status: MET 30%   5.  Patient will tolerate 30 min of walking to return to golf. Baseline: unable Goal status: IN PROGRESS 01/25/23- limited by knee pain.   PLAN:  PT FREQUENCY: 1-2x/week  PT DURATION: 4 weeks  PLANNED INTERVENTIONS: Therapeutic exercises, Therapeutic activity, Neuromuscular re-education, Balance training, Gait training, Patient/Family education, Self Care, Joint mobilization, Stair training, Dry Needling, Electrical stimulation, Spinal mobilization, Cryotherapy, Moist heat, Traction, Ultrasound, Manual therapy, and Re-evaluation.  PLAN FOR NEXT SESSION: focus on R knee/IT band - hip abductor/glut med  strengthening; STM to VMO and Adductors as well as lateral structures.   Artist Pais, PTA 02/08/2023, 6:03 PM

## 2023-02-08 NOTE — H&P (Signed)
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 He states that a golf ball hit him in the right shoulder in about 2003, and he has had a small mass in this area ever since.  It has slowly increased into in size over that time.  It really did not ever bother him until recently, and he started to notice that he has pain with certain movements and positions related to this mass which is on the anterior deltoid region.  Denies any paralysis or paresthesias of the right upper extremity.  No previous procedures in this area. He states his wife noticed a possible mass on his right lower back about 3 weeks ago.  This was present on exam when he went to see his primary care doctor and sounds like likely another lipoma.  This does not bother him at all, no pain or symptoms whatsoever.   Review of Systems: A complete review of systems was obtained from the patient.  I have reviewed this information and discussed as appropriate with the patient.  See HPI as well for other ROS.   Medical History: History reviewed. No pertinent past medical history.  There is no problem list on file for this patient.   Past Surgical History:  Procedure Laterality Date   back surgery     JOINT REPLACEMENT     neck surgery       No Known Allergies  Current Outpatient Medications on File Prior to Visit  Medication Sig Dispense Refill   atorvastatin (LIPITOR) 20 MG tablet Take 20 mg by mouth once daily     celecoxib (CELEBREX) 200 MG capsule Take by mouth     hydroCHLOROthiazide (HYDRODIURIL) 25 MG tablet Take 1 tablet by mouth once daily     No current facility-administered medications on file prior to visit.    Family History  Problem Relation Age of Onset    High blood pressure (Hypertension) Mother    Diabetes Mother    Coronary Artery Disease (Blocked arteries around heart) Father      Social History   Tobacco Use  Smoking Status Former   Types: Cigarettes  Smokeless Tobacco Never     Social History   Socioeconomic History   Marital status: Married  Tobacco Use   Smoking status: Former    Types: Cigarettes   Smokeless tobacco: Never  Vaping Use   Vaping Use: Never used  Substance and Sexual Activity   Alcohol use: Yes   Drug use: Never    Objective:    Vitals:   02/08/23 0956  BP: (!) 150/80  Pulse: 110  Temp: 36.6 C (97.8 F)  SpO2: 99%  Weight: 73.5 kg (162 lb)  Height: 170.2 cm ('5\' 7"'$ )  PainSc: 0-No pain    Body mass index is 25.37 kg/m.  Alert well-appearing Unlabored respirations On the anterior aspect of the right shoulder is a smooth, mobile subcutaneous mass with indistinct borders, this is very soft On the right lower back I am not able to appreciate any discrete mass.  The patient cannot find it either.   Assessment and Plan:  Diagnoses and all orders for this visit:  Subcutaneous mass    Very soft, mobile, but somewhat shapeless mass on  the right anterior shoulder which by palpation is most consistent with lipoma.  Increasingly bothersome with pain and increasing size in the last few months.  Patient desires excision.  I discussed the procedure including risks of bleeding, infection, pain, scarring, injury to underlying structures, recurrence of the lesion especially if it is not a well encapsulated lipoma, failure to resolve pain issues.  Since neither the patient nor I can find the mass that was previously present on his right lower back, in addition to the fact that this is asymptomatic, we will defer any intervention in this area.  Questions welcomed and answered.  He wishes to proceed with excision of right shoulder subcutaneous mass/lipoma  Katelan Hirt Raquel James, MD

## 2023-02-10 ENCOUNTER — Encounter: Payer: Self-pay | Admitting: Physical Therapy

## 2023-02-10 ENCOUNTER — Encounter: Payer: Self-pay | Admitting: Family Medicine

## 2023-02-10 ENCOUNTER — Ambulatory Visit: Payer: Medicare (Managed Care) | Admitting: Family Medicine

## 2023-02-10 ENCOUNTER — Ambulatory Visit: Payer: Self-pay

## 2023-02-10 ENCOUNTER — Ambulatory Visit: Payer: Medicare (Managed Care) | Admitting: Physical Therapy

## 2023-02-10 ENCOUNTER — Other Ambulatory Visit (HOSPITAL_BASED_OUTPATIENT_CLINIC_OR_DEPARTMENT_OTHER): Payer: Self-pay

## 2023-02-10 VITALS — BP 138/86 | Ht 67.0 in | Wt 162.0 lb

## 2023-02-10 DIAGNOSIS — R252 Cramp and spasm: Secondary | ICD-10-CM

## 2023-02-10 DIAGNOSIS — G8929 Other chronic pain: Secondary | ICD-10-CM

## 2023-02-10 DIAGNOSIS — M5441 Lumbago with sciatica, right side: Secondary | ICD-10-CM | POA: Diagnosis not present

## 2023-02-10 DIAGNOSIS — M23203 Derangement of unspecified medial meniscus due to old tear or injury, right knee: Secondary | ICD-10-CM | POA: Diagnosis not present

## 2023-02-10 DIAGNOSIS — M222X1 Patellofemoral disorders, right knee: Secondary | ICD-10-CM

## 2023-02-10 MED ORDER — TRIAMCINOLONE ACETONIDE 40 MG/ML IJ SUSP
40.0000 mg | Freq: Once | INTRAMUSCULAR | Status: AC
  Administered 2023-02-10: 40 mg via INTRA_ARTICULAR

## 2023-02-10 MED ORDER — HYDROCODONE-ACETAMINOPHEN 5-325 MG PO TABS
1.0000 | ORAL_TABLET | Freq: Three times a day (TID) | ORAL | 0 refills | Status: DC | PRN
  Filled 2023-02-10: qty 10, 4d supply, fill #0

## 2023-02-10 NOTE — Patient Instructions (Signed)
Good to see you Please use ice as needed  You can try voltaren over the counter gel  Please continue physical therapy   Please send me a message in MyChart with any questions or updates.  Please see me back in 3-5 weeks.   --Dr. Raeford Razor

## 2023-02-10 NOTE — Progress Notes (Addendum)
  Phillip Hughes - 67 y.o. male MRN [id number]  Date of birth: 1956-04-22  SUBJECTIVE:  Including CC & ROS.  No chief complaint on file.   Phillip Hughes is a 67 y.o. male that is presenting with acute on chronic right knee pain.  He has been in physical therapy but the pain has gotten acutely severe.  He notices the pain over the medial aspect.  He has trouble walking and weightbearing..    Review of Systems See HPI   HISTORY: Past Medical, Surgical, Social, and Family History Reviewed & Updated per EMR.   Pertinent Historical Findings include:  Past Medical History:  Diagnosis Date   Hypertension    Sciatica    Sinusitis     Past Surgical History:  Procedure Laterality Date   BACK SURGERY     HERNIA REPAIR     HIP SURGERY     NECK SURGERY       PHYSICAL EXAM:  VS: BP 138/86   Ht 5' 7"$  (1.702 m)   Wt 162 lb (73.5 kg)   BMI 25.37 kg/m  Physical Exam Gen: NAD, alert, cooperative with exam, well-appearing MSK:  Right knee: Mild effusion. Limited range of motion. Weakness to resistance. Instability with valgus and varus stress testing. Positive McMurray's test Neurovascularly intact     Aspiration/Injection Procedure Note Phillip Hughes February 21, 1956  Procedure: Injection Indications: Right knee pain  Procedure Details Consent: Risks of procedure as well as the alternatives and risks of each were explained to the (patient/caregiver).  Consent for procedure obtained. Time Out: Verified patient identification, verified procedure, site/side was marked, verified correct patient position, special equipment/implants available, medications/allergies/relevent history reviewed, required imaging and test results available.  Performed.  The area was cleaned with iodine and alcohol swabs.    The right knee superior lateral suprapatellar pouch was injected using 1 cc of 1% lidocaine on a 22-gauge 1-1/2 inch needle.  The syringe was switched to mixture containing 1 cc's of 40 mg  Kenalog and 4 cc's of 0.25% bupivacaine was injected.  Ultrasound was used. Images were obtained in long views showing the injection.     A sterile dressing was applied.  Patient did tolerate procedure well.     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  Pain has been ongoing for several months.  Previous imaging has been unrevealing to this point.  He has completed greater than 6 weeks of physician directed home exercise therapy.  This was started on 12/01/2022 as he was to perform straight leg raise and leg extension done and sets of 10 on a daily basis. -Counseled on home exercise therapy and supportive care. - injection  - norco  - MRI of the right knee to evaluate for internal derangement and for presurgical planning.

## 2023-02-10 NOTE — Assessment & Plan Note (Signed)
Acute on chronic in nature.  Pain has been ongoing for several months.  Previous imaging has been unrevealing to this point.  He has completed greater than 6 weeks of physician directed home exercise therapy.  This was started on 12/01/2022 as he was to perform straight leg raise and leg extension done and sets of 10 on a daily basis. -Counseled on home exercise therapy and supportive care. - injection  - norco  - MRI of the right knee to evaluate for internal derangement and for presurgical planning.

## 2023-02-10 NOTE — Therapy (Signed)
OUTPATIENT PHYSICAL THERAPY TREATMENT       Patient Name: Phillip Hughes MRN: [id number] DOB:1956/02/18, 67 y.o., male Today's Date: 02/10/2023  END OF SESSION:  PT End of Session - 02/10/23 0852     Visit Number 15    Number of Visits 20    Date for PT Re-Evaluation 03/01/23    Authorization Type cigna    PT Start Time 0848    PT Stop Time 0922    PT Time Calculation (min) 34 min    Activity Tolerance Patient tolerated treatment well    Behavior During Therapy [facility] for tasks assessed/performed                 Past Medical History:  Diagnosis Date   Hypertension    Sciatica    Sinusitis    Past Surgical History:  Procedure Laterality Date   [place]     NECK SURGERY     Patient Active Problem List   Diagnosis Date Noted   Patellofemoral pain syndrome of right knee 12/01/2022   Lumbar radiculopathy 11/29/2022   Peroneal mononeuropathy, 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    PCP: Mackie Pai, PA-C  REFERRING PROVIDER: Rosemarie Ax, MD   REFERRING DIAG: M54.16 (ICD-10-CM) - Lumbar radiculopathy   Rationale for Evaluation and Treatment: Rehabilitation  THERAPY DIAG:  Chronic right-sided low back pain with right-sided sciatica  Cramp and spasm  Chronic pain of right knee  ONSET DATE: April 2022  SUBJECTIVE:                                                                                                                                                                                           SUBJECTIVE STATEMENT: Hip and knee have gotten really bad, have an appt. With Dr. Raeford Razor right after therapy.   PERTINENT HISTORY:  R hip replacement, ACDF, L4-5 lumbar laminectomy 2017  PAIN:  Are you having pain? Yes: NPRS scale: 10/10 Pain location: R knee and hip Pain description: sore Aggravating factors: walking, standing Relieving factors: pain medication  PRECAUTIONS: None  WEIGHT BEARING RESTRICTIONS: No  FALLS:   Has patient fallen in last 6 months? Yes. Number of falls 1 -off ladder while trimming bushes, ladder was on grass and tipped. No falls due to loss of balance.   LIVING ENVIRONMENT: Lives with: lives with their spouse Lives in: House/apartment Stairs: Yes: Internal: 15 steps; on right going up and to basement, has trouble going up, sometimes goes out basement door and walks around house rather than go up stairs.  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 Severe degenerative disc disease again seen at L2-3, with vacuum disc phenomenon and mild grade 1 degenerative retrolisthesis measuring approximately 4 mm. Lower lumbar facet DJD is seen bilaterally at L4-5 and L5-S1. No focal lytic or sclerotic bone lesions identified.   IMPRESSION: No acute findings.   Stable severe L2-3 degenerative disc disease and mild grade 1 degenerative retrolisthesis.   Lower lumbar facet DJD.  PATIENT SURVEYS:  Modified Oswestry 31/50 =62%   SCREENING FOR RED FLAGS: Bowel or bladder incontinence: No Spinal tumors: No Cauda equina syndrome: No Compression fracture: No Abdominal aneurysm: No  COGNITION: Overall cognitive status: Within functional limits for tasks assessed     SENSATION: WFL  MUSCLE LENGTH: Hamstrings: Right 60 deg; Left 60 deg   POSTURE: decreased lumbar lordosis  PALPATION: Tenderness R lumbar paraspinals, L2/L3.  Decreased mobility L3-5   LUMBAR ROM:   AROM eval  Flexion Almost to toes, increased knee pain  Extension Wnl, increased pain  Right lateral flexion Past knee, tight  Left lateral  flexion Past knee, tight  Right rotation WNL, increased radicular pain  Left rotation WNL increased LBP   (Blank rows = not tested)  LOWER EXTREMITY ROM:     Passive  Right eval Left eval  Hip flexion 70 70  Hip internal rotation 10 25  Hip external rotation 30 30   (Blank rows = not tested)  LOWER EXTREMITY MMT:    MMT Right eval Left eval  Hip flexion 5 5  Hip extension 5 5  Hip abduction 5 5  Hip adduction 5 5  Knee flexion 5 5  Knee extension 5 5  Ankle dorsiflexion 5 5   (Blank rows = not tested)  LUMBAR SPECIAL TESTS:  Straight leg raise test: Negative and Slump test: Negative  FUNCTIONAL TESTS:  5 times sit to stand: 16 seconds, increased LBP  GAIT: Distance walked: 50 Assistive device utilized: None Level of assistance: Complete Independence Comments: very antalgic  TODAY'S TREATMENT:                                                                                                                              DATE:  02/10/23 Therapeutic Exercise: to improve strength and mobility.  Demo, verbal and tactile cues throughout for technique. Nustep L5 x 3 min  Seated quad sets x 10 Vasopneumatic: to decrease pain/edema.  15 min, low compression, 34 deg (coldest).    02/08/23 Therapeutic Exercise: to improve strength and mobility.  Demo, verbal and tactile cues throughout for technique. Bike L3 x 6 min  Seated  ball squeeze 2x10 R side lunge with UE support x 10 R SLS 3x20 sec S/L R hip abduction x 10 S/L R hip abduction to ext x 10   Manual Therapy: to decrease muscle spasm and pain and improve mobility. STM to R ITB, RF, vastus lateralis  02/03/23 Therapeutic Exercise: to improve strength and mobility.  Demo, verbal and tactile cues throughout for technique. Bike L3 x 6 min  HS  and ITB stretch with strap 2 x 1 min each TFL stretch in L SDLY x 1 min SL hip abduction  3 x 10  SL hip circles CW/CCW, large bicycle and toe taps front and back each to  fatigue  Manual Therapy: to decrease muscle spasm and pain and improve mobility. IASTM with EDGE tool to R ITB, RF, vastus lateralis, peroneals  02/01/23 Therapeutic Exercise: to improve strength and mobility.  Demo, verbal and tactile cues throughout for technique. Bike L3 x 6 min  Itband stretches- seated and sidelying Wall squats 2 x 15 sec hold SL hip abduction  3 x 10  Squats with BTB x 10 Therapeutic Activity:  review of goals, progress and Oswestry Manual Therapy: to decrease muscle spasm and pain and improve mobility. STM/TPR to R ITBand, vastus lateralis, peroneals, mobs to fibular head, IASTM to Itband.   01/27/23 Therapeutic Exercise: to improve strength and mobility.  Demo, verbal and tactile cues throughout for technique. Bike L3 x 8 min Heel slide x 10 RLE Passive R quad stretch 3x15" Seated ball squeeze 10x5" Seated LAQ with ball squeeze at feet x 10 Mini wall squat 6x5"  Manual Therapy: to decrease muscle spasm and pain and improve mobility STM/TPR to R ITBand, vastus lateralis  PATIENT EDUCATION:  Education details: HEP update  Person educated: Patient Education method: Explanation, Demonstration, Verbal cues, and Handouts Education comprehension: verbalized understanding and returned demonstration  HOME EXERCISE PROGRAM: Access Code: [email or web address] URL: [email or web address] Date: 02/01/2023 Prepared by: Glenetta Hew  Exercises - Prone Hip Extension  - 1 x daily - 7 x weekly - 3 sets - 10 reps - Prone Press Up On Elbows  - 1 x daily - 7 x weekly - 3 sets - 10 reps - Supine Bridge with Resistance Band  - 1 x daily - 7 x weekly - 3 sets - 10 reps - Sidelying Hip Abduction  - 1 x daily - 7 x weekly - 3 sets - 10 reps - Standing Diagonal Chop  - 1 x daily - 7 x weekly - 3 sets - 10 reps - Reverse Chop with Resistance  - 1 x daily - 7 x weekly - 3 sets - 10 reps - Forward T with Counter Support  - 1 x daily - 7 x weekly - 3 sets - 10 reps - Prone  Quadriceps Stretch with Strap  - 1 x daily - 7 x weekly - 1 sets - 3 reps - 45 sec - 1 min hold - Seated Knee Flexion with Anchored Resistance  - 1 x daily - 7 x weekly - 2-3 sets - 10 reps - Seated Knee Extension with Resistance  - 1 x daily - 7 x weekly - 2-3 sets - 10 reps - Lateral Step Down  - 1 x daily - 7 x weekly - 2-3 sets - 10 reps - Wall Quarter Squat  - 1 x daily - 7 x weekly - 2 sets - 5 reps - 15 sec hold - Bilateral Long Arc Quad  - 1 x daily -  7 x weekly - 2 sets - 10 reps - Supine ITB Stretch with Strap  - 1 x daily - 7 x weekly - 3 sets - 10 reps - Sidelying TFL Stretch  - 1 x daily - 7 x weekly - 1 sets - 10 reps - Sidelying Diagonal Hip Abduction  - 1 x daily - 7 x weekly - 3 sets - 10 reps  ASSESSMENT:  CLINICAL IMPRESSION: Ken Cambray reports that he had some pain in the medial side of his R knee couple of days ago, then started having severe pain in his R knee yesterday, to the point he is having difficulty walking and reported difficulty with sleep last night.  He has already scheduled follow-up with Dr. Raeford Razor immediately after session.  He had very poor tolerance to exercise today, even quad sets, due to pain, so discontinued exercises placed on game ready to decrease pain.  He had no tenderness along ITBand, lateral joint line or LCL (even though he has been insisting this is "just like" the pain he had before with Itband) and tenderness is more midline with patellar mobs.   He reports no injury or inciting movement with this current pain and denies any clicking.    Will discuss POC after seeing what MD says.     OBJECTIVE IMPAIRMENTS: Abnormal gait, decreased endurance, decreased mobility, difficulty walking, decreased ROM, hypomobility, increased fascial restrictions, increased muscle spasms, impaired flexibility, postural dysfunction, and pain.   ACTIVITY LIMITATIONS: carrying, lifting, bending, sitting, standing, squatting, sleeping, stairs, transfers, and  locomotion level  PARTICIPATION LIMITATIONS: cleaning, laundry, shopping, and community activity  PERSONAL FACTORS: Past/current experiences, Time since onset of injury/illness/exacerbation, and 1-2 comorbidities: severe degenerative disc disease, R THA, chronic low back pain  are also affecting patient's functional outcome.   REHAB POTENTIAL: Good  CLINICAL DECISION MAKING: Evolving/moderate complexity  EVALUATION COMPLEXITY: Moderate   GOALS: Goals reviewed with patient? Yes  SHORT TERM GOALS: Target date: 01/04/2023   Patient will be independent with initial HEP.  Baseline: given Goal status: MET 12/30/2022- good compliance.   2.  Patient will report centralization of radicular symptoms.  Baseline: radiates to R knee Goal status: MET- 01/05/23  LONG TERM GOALS: Target date: 02/01/2023  extended to 03/01/2023  Patient will be independent with advanced/ongoing HEP to improve outcomes and carryover.  Baseline:  Goal status: MET 02/01/23 - met for current.   2.  Patient will report 75% improvement in low back pain to improve QOL.  Baseline:  Goal status: MET- 01/05/23 75% improvement  3.  Patient will demonstrate full pain free lumbar ROM to perform ADLs.   Baseline: see objective Goal status: MET 01/25/23 - full AROM no pain.   4.  Patient will report 15% improvement on modified Oswestry to demonstrate improved functional ability.  Baseline: 62% disability  Goal status: MET 30%   5.  Patient will tolerate 30 min of walking to return to golf. Baseline: unable Goal status: IN PROGRESS 01/25/23- limited by knee pain.   PLAN:  PT FREQUENCY: 1-2x/week  PT DURATION: 4 weeks  PLANNED INTERVENTIONS: Therapeutic exercises, Therapeutic activity, Neuromuscular re-education, Balance training, Gait training, Patient/Family education, Self Care, Joint mobilization, Stair training, Dry Needling, Electrical stimulation, Spinal mobilization, Cryotherapy, Moist heat, Traction, Ultrasound,  Manual therapy, and Re-evaluation.  PLAN FOR NEXT SESSION: focus on R knee/IT band - hip abductor/glut med strengthening; STM to VMO and Adductors as well as lateral structures.   Rennie Natter, PT, DPT  02/10/2023, 9:26 AM

## 2023-02-15 ENCOUNTER — Ambulatory Visit: Payer: Medicare (Managed Care) | Attending: Family Medicine

## 2023-02-15 DIAGNOSIS — G8929 Other chronic pain: Secondary | ICD-10-CM | POA: Diagnosis not present

## 2023-02-15 DIAGNOSIS — M5441 Lumbago with sciatica, right side: Secondary | ICD-10-CM | POA: Diagnosis not present

## 2023-02-15 DIAGNOSIS — M25561 Pain in right knee: Secondary | ICD-10-CM | POA: Diagnosis not present

## 2023-02-15 DIAGNOSIS — R252 Cramp and spasm: Secondary | ICD-10-CM | POA: Diagnosis not present

## 2023-02-15 NOTE — Therapy (Signed)
OUTPATIENT PHYSICAL THERAPY TREATMENT       Patient Name: Phillip Hughes MRN: [id number] DOB:1956/05/16, 67 y.o., male Today's Date: 02/15/2023  END OF SESSION:  PT End of Session - 02/15/23 1036     Visit Number 16    Number of Visits 20    Date for PT Re-Evaluation 03/01/23    Authorization Type cigna    PT Start Time [phone]    PT Stop Time 1022    PT Time Calculation (min) 48 min    Activity Tolerance Patient tolerated treatment well    Behavior During Therapy [facility] for tasks assessed/performed                  Past Medical History:  Diagnosis Date   Hypertension    Sciatica    Sinusitis    Past Surgical History:  Procedure Laterality Date   [place]     NECK SURGERY     Patient Active Problem List   Diagnosis Date Noted   Degenerative tear of medial meniscus of right knee 12/01/2022   Lumbar radiculopathy 11/29/2022   Peroneal mononeuropathy, 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    PCP: Mackie Pai, PA-C  REFERRING PROVIDER: Rosemarie Ax, MD   REFERRING DIAG: M54.16 (ICD-10-CM) - Lumbar radiculopathy   Rationale for Evaluation and Treatment: Rehabilitation  THERAPY DIAG:  Chronic right-sided low back pain with right-sided sciatica  Cramp and spasm  Chronic pain of right knee  ONSET DATE: April 2022  SUBJECTIVE:                                                                                                                                                                                           SUBJECTIVE STATEMENT: Knee pain has gotten worse, he got relief from the lidocaine shot from Dr. Raeford Razor but Saturday he was out and rushed to the bathroom walking fast when his knee started hurting worse.  PERTINENT HISTORY: R hip replacement, ACDF, L4-5 lumbar laminectomy 2017  PAIN:  Are you having pain? Yes: NPRS scale: 8/10 Pain location: R knee and hip Pain description: sore Aggravating factors: walking,  standing Relieving factors: pain medication  PRECAUTIONS: None  WEIGHT BEARING RESTRICTIONS: No  FALLS:  Has patient fallen in last 6 months? Yes. Number of falls 1 -off ladder while trimming bushes, ladder was on grass and tipped. No falls due to loss of balance.   LIVING ENVIRONMENT: Lives with: lives with their spouse Lives in: House/apartment Stairs: Yes: Internal: 15 steps; on right going up and to basement, has trouble going up, sometimes goes out  basement door and walks around house rather than go up stairs.  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 Severe degenerative disc disease again seen at L2-3, with vacuum disc phenomenon and mild grade 1 degenerative retrolisthesis measuring approximately 4 mm. Lower lumbar facet DJD is seen bilaterally at L4-5 and L5-S1. No focal lytic or sclerotic bone lesions identified.   IMPRESSION: No acute findings.   Stable severe L2-3 degenerative disc disease and mild grade 1 degenerative retrolisthesis.   Lower lumbar facet DJD.  PATIENT SURVEYS:  Modified Oswestry 31/50 =62%   SCREENING FOR RED FLAGS: Bowel or bladder incontinence: No Spinal tumors: No Cauda equina syndrome: No Compression fracture: No Abdominal aneurysm: No  COGNITION: Overall cognitive status: Within functional limits for tasks assessed     SENSATION: WFL  MUSCLE LENGTH: Hamstrings: Right 60 deg; Left 60 deg   POSTURE: decreased lumbar lordosis  PALPATION: Tenderness R lumbar paraspinals, L2/L3.  Decreased mobility L3-5   LUMBAR ROM:   AROM eval  Flexion Almost to toes,  increased knee pain  Extension Wnl, increased pain  Right lateral flexion Past knee, tight  Left lateral flexion Past knee, tight  Right rotation WNL, increased radicular pain  Left rotation WNL increased LBP   (Blank rows = not tested)  LOWER EXTREMITY ROM:     Passive  Right eval Left eval  Hip flexion 70 70  Hip internal rotation 10 25  Hip external rotation 30 30   (Blank rows = not tested)  LOWER EXTREMITY MMT:    MMT Right eval Left eval  Hip flexion 5 5  Hip extension 5 5  Hip abduction 5 5  Hip adduction 5 5  Knee flexion 5 5  Knee extension 5 5  Ankle dorsiflexion 5 5   (Blank rows = not tested)  LUMBAR SPECIAL TESTS:  Straight leg raise test: Negative and Slump test: Negative  FUNCTIONAL TESTS:  5 times sit to stand: 16 seconds, increased LBP  GAIT: Distance walked: 50 Assistive device utilized: None Level of assistance: Complete Independence Comments: very antalgic  TODAY'S TREATMENT:                                                                                                                              DATE:  02/15/23 Therapeutic Exercise: to improve strength and mobility.  Demo, verbal and tactile cues throughout for technique. Bike L1x30mn Prone quad stretch 2x30 R LE  Prone hip extension 2x10 bil S/L hip abduction x 10 RLE  Manual Therapy: to decrease muscle spasm and pain and improve mobility.  STM to R lateral patellar tendon with ankle DF in knee extension, R VL and ITB  Vasopneumatic: to decrease pain/edema.  10 min, low compression, 34 deg (coldest)  02/10/23 Therapeutic Exercise: to improve strength and mobility.  Demo, verbal and tactile cues throughout for technique. Nustep L5 x 3 min  Seated quad sets x 10 Vasopneumatic: to decrease pain/edema.  15 min, low compression, 34 deg (coldest).    02/08/23 Therapeutic Exercise: to improve strength and mobility.  Demo, verbal and tactile cues throughout for technique. Bike L3 x 6 min   Seated ball squeeze 2x10 R side lunge with UE support x 10 R SLS 3x20 sec S/L R hip abduction x 10 S/L R hip abduction to ext x 10   Manual Therapy: to decrease muscle spasm and pain and improve mobility. STM to R ITB, RF, vastus lateralis  02/03/23 Therapeutic Exercise: to improve strength and mobility.  Demo, verbal and tactile cues throughout for technique. Bike L3 x 6 min  HS  and ITB stretch with strap 2 x 1 min each TFL stretch in L SDLY x 1 min SL hip abduction  3 x 10  SL hip circles CW/CCW, large bicycle and toe taps front and back each to fatigue  Manual Therapy: to decrease muscle spasm and pain and improve mobility. IASTM with EDGE tool to R ITB, RF, vastus lateralis, peroneals  02/01/23 Therapeutic Exercise: to improve strength and mobility.  Demo, verbal and tactile cues throughout for technique. Bike L3 x 6 min  Itband stretches- seated and sidelying Wall squats 2 x 15 sec hold SL hip abduction  3 x 10  Squats with BTB x 10 Therapeutic Activity:  review of goals, progress and Oswestry Manual Therapy: to decrease muscle spasm and pain and improve mobility. STM/TPR to R ITBand, vastus lateralis, peroneals, mobs to fibular head, IASTM to Itband.   01/27/23 Therapeutic Exercise: to improve strength and mobility.  Demo, verbal and tactile cues throughout for technique. Bike L3 x 8 min Heel slide x 10 RLE Passive R quad stretch 3x15" Seated ball squeeze 10x5" Seated LAQ with ball squeeze at feet x 10 Mini wall squat 6x5"  Manual Therapy: to decrease muscle spasm and pain and improve mobility STM/TPR to R ITBand, vastus lateralis  PATIENT EDUCATION:  Education details: HEP update  Person educated: Patient Education method: Explanation, Demonstration, Verbal cues, and Handouts Education comprehension: verbalized understanding and returned demonstration  HOME EXERCISE PROGRAM: Access Code: [email or web address] URL: [email or web address] Date:  02/01/2023 Prepared by: Glenetta Hew  Exercises - Prone Hip Extension  - 1 x daily - 7 x weekly - 3 sets - 10 reps - Prone Press Up On Elbows  - 1 x daily - 7 x weekly - 3 sets - 10 reps - Supine Bridge with Resistance Band  - 1 x daily - 7 x weekly - 3 sets - 10 reps - Sidelying Hip Abduction  - 1 x daily - 7 x weekly - 3 sets - 10 reps - Standing Diagonal Chop  - 1 x daily - 7 x weekly - 3 sets - 10 reps - Reverse Chop with Resistance  - 1 x daily - 7 x weekly - 3 sets - 10 reps - Forward T with Counter Support  - 1 x daily - 7 x weekly - 3 sets - 10 reps - Prone Quadriceps Stretch with Strap  - 1 x daily - 7 x weekly - 1 sets - 3 reps - 45 sec - 1  min hold - Seated Knee Flexion with Anchored Resistance  - 1 x daily - 7 x weekly - 2-3 sets - 10 reps - Seated Knee Extension with Resistance  - 1 x daily - 7 x weekly - 2-3 sets - 10 reps - Lateral Step Down  - 1 x daily - 7 x weekly - 2-3 sets - 10 reps - Wall Quarter Squat  - 1 x daily - 7 x weekly - 2 sets - 5 reps - 15 sec hold - Bilateral Long Arc Quad  - 1 x daily - 7 x weekly - 2 sets - 10 reps - Supine ITB Stretch with Strap  - 1 x daily - 7 x weekly - 3 sets - 10 reps - Sidelying TFL Stretch  - 1 x daily - 7 x weekly - 1 sets - 10 reps - Sidelying Diagonal Hip Abduction  - 1 x daily - 7 x weekly - 3 sets - 10 reps  ASSESSMENT:  CLINICAL IMPRESSION: Pt today with c/o lateral knee pain after walking fast rushing to go to bathroom in [employer] this past Saturday he reports. He did report that the injection given from doctor helped but ever since Saturday it has hurt worse. He was limited with exercise tolerance today d/t R knee pain. He was very tender over the lateral patellar tendon and VL. GR was done post session to address swelling and soreness.      OBJECTIVE IMPAIRMENTS: Abnormal gait, decreased endurance, decreased mobility, difficulty walking, decreased ROM, hypomobility, increased fascial restrictions, increased muscle  spasms, impaired flexibility, postural dysfunction, and pain.   ACTIVITY LIMITATIONS: carrying, lifting, bending, sitting, standing, squatting, sleeping, stairs, transfers, and locomotion level  PARTICIPATION LIMITATIONS: cleaning, laundry, shopping, and community activity  PERSONAL FACTORS: Past/current experiences, Time since onset of injury/illness/exacerbation, and 1-2 comorbidities: severe degenerative disc disease, R THA, chronic low back pain  are also affecting patient's functional outcome.   REHAB POTENTIAL: Good  CLINICAL DECISION MAKING: Evolving/moderate complexity  EVALUATION COMPLEXITY: Moderate   GOALS: Goals reviewed with patient? Yes  SHORT TERM GOALS: Target date: 01/04/2023   Patient will be independent with initial HEP.  Baseline: given Goal status: MET 12/30/2022- good compliance.   2.  Patient will report centralization of radicular symptoms.  Baseline: radiates to R knee Goal status: MET- 01/05/23  LONG TERM GOALS: Target date: 02/01/2023  extended to 03/01/2023  Patient will be independent with advanced/ongoing HEP to improve outcomes and carryover.  Baseline:  Goal status: MET 02/01/23 - met for current.   2.  Patient will report 75% improvement in low back pain to improve QOL.  Baseline:  Goal status: MET- 01/05/23 75% improvement  3.  Patient will demonstrate full pain free lumbar ROM to perform ADLs.   Baseline: see objective Goal status: MET 01/25/23 - full AROM no pain.   4.  Patient will report 15% improvement on modified Oswestry to demonstrate improved functional ability.  Baseline: 62% disability  Goal status: MET 30%   5.  Patient will tolerate 30 min of walking to return to golf. Baseline: unable Goal status: IN PROGRESS 01/25/23- limited by knee pain.   PLAN:  PT FREQUENCY: 1-2x/week  PT DURATION: 4 weeks  PLANNED INTERVENTIONS: Therapeutic exercises, Therapeutic activity, Neuromuscular re-education, Balance training, Gait training,  Patient/Family education, Self Care, Joint mobilization, Stair training, Dry Needling, Electrical stimulation, Spinal mobilization, Cryotherapy, Moist heat, Traction, Ultrasound, Manual therapy, and Re-evaluation.  PLAN FOR NEXT SESSION: focus on R knee/IT band -  hip abductor/glut med strengthening; STM to VMO and Adductors as well as lateral structures.   Artist Pais, PTA 02/15/2023, 10:37 AM

## 2023-02-17 ENCOUNTER — Ambulatory Visit: Payer: Medicare (Managed Care)

## 2023-02-17 ENCOUNTER — Other Ambulatory Visit: Payer: Self-pay

## 2023-02-17 DIAGNOSIS — M5441 Lumbago with sciatica, right side: Secondary | ICD-10-CM | POA: Diagnosis not present

## 2023-02-17 DIAGNOSIS — R252 Cramp and spasm: Secondary | ICD-10-CM

## 2023-02-17 DIAGNOSIS — G8929 Other chronic pain: Secondary | ICD-10-CM

## 2023-02-17 NOTE — Therapy (Addendum)
OUTPATIENT PHYSICAL THERAPY TREATMENT       Patient Name: Phillip Hughes MRN: [id number] DOB:January 04, 1956, 67 y.o., male Today's Date: 02/17/2023  END OF SESSION:  PT End of Session - 02/17/23 0818     Visit Number 17    Number of Visits 20    Date for PT Re-Evaluation 03/01/23    Authorization Type cigna    PT Start Time 0845    PT Stop Time 0930    PT Time Calculation (min) 45 min    Activity Tolerance Patient tolerated treatment well    Behavior During Therapy [facility] for tasks assessed/performed                   Past Medical History:  Diagnosis Date   Hypertension    Sciatica    Sinusitis    Past Surgical History:  Procedure Laterality Date   BACK SURGERY     HERNIA REPAIR     HIP SURGERY     NECK SURGERY     Patient Active Problem List   Diagnosis Date Noted   Degenerative tear of medial meniscus of right knee 12/01/2022   Lumbar radiculopathy 11/29/2022   Peroneal mononeuropathy, 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    PCP: Mackie Pai, PA-C  REFERRING PROVIDER: Rosemarie Ax, MD   REFERRING DIAG: M54.16 (ICD-10-CM) - Lumbar radiculopathy   Rationale for Evaluation and Treatment: Rehabilitation  THERAPY DIAG:  Chronic right-sided low back pain with right-sided sciatica  Cramp and spasm  Chronic pain of right knee  ONSET DATE: April 2022                                                                                                                                                                            SUBJECTIVE STATEMENT:Not as bad as I was the other day , pretty much stayed in bed a lot yesterday, though PERTINENT HISTORY: R hip replacement 2016, ACDF, L4-5 lumbar laminectomy 2017  PAIN:  Are you having pain? Yes: NPRS scale: 6/10 Pain location: R knee and hip Pain description: sore Aggravating factors: walking, standing Relieving factors: pain medication  PRECAUTIONS: None  WEIGHT BEARING RESTRICTIONS: No  FALLS:  Has patient fallen in  last 6 months? Yes. Number of falls 1 -off ladder while trimming bushes, ladder was on grass and tipped. No falls due to loss of balance.   LIVING ENVIRONMENT: Lives with: lives with their spouse Lives in: House/apartment Stairs: Yes: Internal: 15 steps; on right going up and to basement, has trouble going up, sometimes goes out basement door and walks around house rather than go up stairs.  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 Severe degenerative disc disease again seen at L2-3, with vacuum disc phenomenon and mild grade 1 degenerative retrolisthesis measuring approximately 4 mm. Lower lumbar facet DJD is seen bilaterally at L4-5 and L5-S1. No focal lytic or sclerotic bone lesions identified.   IMPRESSION: No acute findings.   Stable severe L2-3 degenerative disc disease and mild grade 1 degenerative retrolisthesis.   Lower lumbar facet DJD.  PATIENT SURVEYS:  Modified Oswestry 31/50 =62%   SCREENING FOR RED FLAGS: Bowel or bladder incontinence: No Spinal tumors: No Cauda equina syndrome: No Compression fracture: No Abdominal aneurysm: No  COGNITION: Overall cognitive status: Within functional limits for tasks assessed     SENSATION: WFL  MUSCLE LENGTH: Hamstrings: Right 60 deg; Left 60 deg   POSTURE: decreased lumbar lordosis  PALPATION: Tenderness R lumbar paraspinals, L2/L3.  Decreased mobility L3-5   LUMBAR ROM:   AROM eval  Flexion Almost to toes, increased knee pain  Extension Wnl, increased pain  Right lateral flexion Past knee, tight  Left lateral flexion Past knee, tight   Right rotation WNL, increased radicular pain  Left rotation WNL increased LBP   (Blank rows = not tested)  LOWER EXTREMITY ROM:     Passive  Right eval Left eval  Hip flexion 70 70  Hip internal rotation 10 25  Hip external rotation 30 30   (Blank rows = not tested)  LOWER EXTREMITY MMT:    MMT Right eval Left eval  Hip flexion 5 5  Hip extension 5 5  Hip abduction 5 5  Hip adduction 5 5  Knee flexion 5 5  Knee extension 5 5  Ankle dorsiflexion 5 5   (Blank rows = not tested)  LUMBAR SPECIAL TESTS:  Straight leg raise test: Negative and Slump test: Negative  FUNCTIONAL TESTS:  5 times sit to stand: 16 seconds, increased LBP  GAIT: Distance walked: 50 Assistive device utilized: None Level of assistance: Complete Independence Comments: very antalgic  TODAY'S TREATMENT:                                                                                                                              DATE:   02/17/23: Manual:Bike L1x14mn Quick MMT, isolated strength testing for R TFL (side lying with hip flexed 40 degrees and knee flexed 40 degrees) with provocative pain reproduced Prone for R hip manually resisted hip ER/IR with knee flexed to 90, marked weakness and compensation with ER Standing for toe raises(ball between heels) with hips Er, patient able to perform 10 reps , but had increased pain R leg  again, so discontinued Instructed in supine isometric B hip Er  with belt around forefeet, to isolate hip external rotators with hip extended.  Advised patient that his pain needs to stay 4 or below for this exercise, to perform 10 sec holds, 10 reps, 2 x day, to improve tendon, muscular irritation.    Kinesiotaping:  Applied 2 -I pieces with patient standing candy cane pattern, for support of R hip ER in mid stance position. Tape from medial knee, wrapped around thigh, and anchored R post hip.    Therapeutic exercise: 02/15/23 Therapeutic Exercise: to improve strength and  mobility.  Demo, verbal and tactile cues throughout for technique. Bike L1x41mn Prone quad stretch 2x30 R LE  Prone hip extension 2x10 bil S/L hip abduction x 10 RLE  Manual Therapy: to decrease muscle spasm and pain and improve mobility. STM to R lateral patellar tendon with ankle DF in knee extension, R VL and ITB  Vasopneumatic: to decrease pain/edema.  10 min, low compression, 34 deg (coldest)  02/10/23 Therapeutic Exercise: to improve strength and mobility.  Demo, verbal and tactile cues throughout for technique. Nustep L5 x 3 min  Seated quad sets x 10 Vasopneumatic: to decrease pain/edema.  15 min, low compression, 34 deg (coldest).    02/08/23 Therapeutic Exercise: to improve strength and mobility.  Demo, verbal and tactile cues throughout for technique. Bike L3 x 6 min  Seated ball squeeze 2x10 R side lunge with UE support x 10 R SLS 3x20 sec S/L R hip abduction x 10 S/L R hip abduction to ext x 10   Manual Therapy: to decrease muscle spasm and pain and improve mobility. STM to R ITB, RF, vastus lateralis  02/03/23 Therapeutic Exercise: to improve strength and mobility.  Demo, verbal and tactile cues throughout for technique. Bike L3 x 6 min  HS  and ITB stretch with strap 2 x 1 min each TFL stretch in L SDLY x 1 min SL hip abduction  3 x 10  SL hip circles CW/CCW, large bicycle and toe taps front and back each to fatigue  Manual Therapy: to decrease muscle spasm and pain and improve mobility. IASTM with EDGE tool to R ITB, RF, vastus lateralis, peroneals  02/01/23 Therapeutic Exercise: to improve strength and mobility.  Demo, verbal and tactile cues throughout for technique. Bike L3 x 6 min  Itband stretches- seated and sidelying Wall squats 2 x 15 sec hold SL hip abduction  3 x 10  Squats with BTB x 10 Therapeutic Activity:  review of goals, progress and Oswestry Manual Therapy: to decrease muscle spasm and pain and improve mobility. STM/TPR to R ITBand, vastus  lateralis, peroneals, mobs to fibular head, IASTM to Itband.   01/27/23 Therapeutic Exercise: to improve strength and mobility.  Demo, verbal and tactile cues throughout for technique. Bike L3 x 8 min Heel slide x 10 RLE Passive R quad stretch 3x15" Seated ball squeeze 10x5" Seated LAQ with ball squeeze at feet x 10 Mini wall squat 6x5"  Manual Therapy: to decrease muscle spasm and pain and improve mobility STM/TPR to R ITBand, vastus lateralis  PATIENT EDUCATION:  Education details: HEP update  Person educated: Patient Education method: Explanation, Demonstration, Verbal cues, and Handouts Education comprehension: verbalized understanding and returned demonstration  HOME EXERCISE PROGRAM: Access Code: [email or web address] URL: [email or web address] Date: 02/01/2023 Prepared by: EGlenetta Hew Exercises - Prone Hip Extension  - 1 x daily - 7 x weekly - 3 sets - 10 reps - Prone Press Up On Elbows  - 1 x daily - 7 x  weekly - 3 sets - 10 reps - Supine Bridge with Resistance Band  - 1 x daily - 7 x weekly - 3 sets - 10 reps - Sidelying Hip Abduction  - 1 x daily - 7 x weekly - 3 sets - 10 reps - Standing Diagonal Chop  - 1 x daily - 7 x weekly - 3 sets - 10 reps - Reverse Chop with Resistance  - 1 x daily - 7 x weekly - 3 sets - 10 reps - Forward T with Counter Support  - 1 x daily - 7 x weekly - 3 sets - 10 reps - Prone Quadriceps Stretch with Strap  - 1 x daily - 7 x weekly - 1 sets - 3 reps - 45 sec - 1 min hold - Seated Knee Flexion with Anchored Resistance  - 1 x daily - 7 x weekly - 2-3 sets - 10 reps - Seated Knee Extension with Resistance  - 1 x daily - 7 x weekly - 2-3 sets - 10 reps - Lateral Step Down  - 1 x daily - 7 x weekly - 2-3 sets - 10 reps - Wall Quarter Squat  - 1 x daily - 7 x weekly - 2 sets - 5 reps - 15 sec hold - Bilateral Long Arc Quad  - 1 x daily - 7 x weekly - 2 sets - 10 reps - Supine ITB Stretch with Strap  - 1 x daily - 7 x weekly - 3 sets - 10  reps - Sidelying TFL Stretch  - 1 x daily - 7 x weekly - 1 sets - 10 reps - Sidelying Diagonal Hip Abduction  - 1 x daily - 7 x weekly - 3 sets - 10 reps  ASSESSMENT:  CLINICAL IMPRESSION: Pt today with c/o lateral knee pain after walking fast rushing to go to bathroom in [employer] this past Saturday he reports. He did report that the injection given from doctor helped but ever since Saturday it has hurt worse. He was limited with exercise tolerance today d/t R knee pain. He was very tender over the lateral patellar tendon and VL. GR was done post session to address swelling and soreness.      OBJECTIVE IMPAIRMENTS: Abnormal gait, decreased endurance, decreased mobility, difficulty walking, decreased ROM, hypomobility, increased fascial restrictions, increased muscle spasms, impaired flexibility, postural dysfunction, and pain.   ACTIVITY LIMITATIONS: carrying, lifting, bending, sitting, standing, squatting, sleeping, stairs, transfers, and locomotion level  PARTICIPATION LIMITATIONS: cleaning, laundry, shopping, and community activity  PERSONAL FACTORS: Past/current experiences, Time since onset of injury/illness/exacerbation, and 1-2 comorbidities: severe degenerative disc disease, R THA, chronic low back pain  are also affecting patient's functional outcome.   REHAB POTENTIAL: Good  CLINICAL DECISION MAKING: Evolving/moderate complexity  EVALUATION COMPLEXITY: Moderate   GOALS: Goals reviewed with patient? Yes  SHORT TERM GOALS: Target date: 01/04/2023   Patient will be independent with initial HEP.  Baseline: given Goal status: MET 12/30/2022- good compliance.   2.  Patient will report centralization of radicular symptoms.  Baseline: radiates to R knee Goal status: MET- 01/05/23  LONG TERM GOALS: Target date: 02/01/2023  extended to 03/01/2023  Patient will be independent with advanced/ongoing HEP to improve outcomes and carryover.  Baseline:  Goal status: MET 02/01/23 - met  for current.   2.  Patient will report 75% improvement in low back pain to improve QOL.  Baseline:  Goal status: MET- 01/05/23 75% improvement  3.  Patient will demonstrate  full pain free lumbar ROM to perform ADLs.   Baseline: see objective Goal status: MET 01/25/23 - full AROM no pain.   4.  Patient will report 15% improvement on modified Oswestry to demonstrate improved functional ability.  Baseline: 62% disability  Goal status: MET 30%   5.  Patient will tolerate 30 min of walking to return to golf. Baseline: unable Goal status: IN PROGRESS 01/25/23- limited by knee pain.   PLAN:  PT FREQUENCY: 1-2x/week  PT DURATION: 4 weeks  PLANNED INTERVENTIONS: Therapeutic exercises, Therapeutic activity, Neuromuscular re-education, Balance training, Gait training, Patient/Family education, Self Care, Joint mobilization, Stair training, Dry Needling, Electrical stimulation, Spinal mobilization, Cryotherapy, Moist heat, Traction, Ultrasound, Manual therapy, and Re-evaluation.  PLAN FOR NEXT SESSION: did tape help? Additional quads strengthening recommended, manual techniques as needed Danaly Bari L Travaughn Vue, PT 02/17/2023, 12:02 PM   PHYSICAL THERAPY DISCHARGE SUMMARY  Visits from Start of Care: 17  Current functional level related to goals / functional outcomes: Patient had been making good progress meeting all goals except for walking longer distances to return to golf limited by R knee pain.    Remaining deficits: New onset severe R knee pain.    Education / Equipment: HEP  Plan: Patient agrees to discharge.  Patient goals were not met. Patient is being discharged due to stopping therapy to wait for MRI results.  He has not been seen since 02/17/2023.    Jena Gauss, PT, DPT 10:48 AM 04/27/2023

## 2023-02-22 ENCOUNTER — Ambulatory Visit: Payer: Medicare (Managed Care)

## 2023-02-23 NOTE — Addendum Note (Signed)
Addended by: Cresenciano Lick on: 02/23/2023 09:15 AM   Modules accepted: Orders

## 2023-02-24 ENCOUNTER — Ambulatory Visit: Payer: Medicare (Managed Care)

## 2023-02-25 ENCOUNTER — Other Ambulatory Visit: Payer: Self-pay

## 2023-02-25 ENCOUNTER — Emergency Department (HOSPITAL_BASED_OUTPATIENT_CLINIC_OR_DEPARTMENT_OTHER)
Admission: EM | Admit: 2023-02-25 | Discharge: 2023-02-26 | Disposition: A | Discharge status: Home / Self Care | Payer: Medicare (Managed Care) | Attending: Emergency Medicine | Admitting: Emergency Medicine

## 2023-02-25 ENCOUNTER — Encounter (HOSPITAL_BASED_OUTPATIENT_CLINIC_OR_DEPARTMENT_OTHER): Payer: Self-pay | Admitting: Emergency Medicine

## 2023-02-25 DIAGNOSIS — M5431 Sciatica, right side: Secondary | ICD-10-CM

## 2023-02-25 DIAGNOSIS — M5441 Lumbago with sciatica, right side: Secondary | ICD-10-CM | POA: Diagnosis not present

## 2023-02-25 NOTE — ED Triage Notes (Signed)
Patient arrived via POV c/o sciatica down right leg x 3 days. Patient has hx of same, being seen by sports medicine for same. Patient states 10/10 pain. Patient is AO x 4, VS WDL, slow gait with crutches.

## 2023-02-26 ENCOUNTER — Encounter (HOSPITAL_BASED_OUTPATIENT_CLINIC_OR_DEPARTMENT_OTHER): Payer: Self-pay | Admitting: Urology

## 2023-02-26 ENCOUNTER — Emergency Department (HOSPITAL_BASED_OUTPATIENT_CLINIC_OR_DEPARTMENT_OTHER)
Admission: EM | Admit: 2023-02-26 | Discharge: 2023-02-26 | Disposition: A | Discharge status: Home / Self Care | Payer: Medicare (Managed Care) | Source: Home / Self Care | Attending: Emergency Medicine | Admitting: Emergency Medicine

## 2023-02-26 ENCOUNTER — Other Ambulatory Visit: Payer: Medicare (Managed Care)

## 2023-02-26 DIAGNOSIS — M5431 Sciatica, right side: Secondary | ICD-10-CM | POA: Insufficient documentation

## 2023-02-26 DIAGNOSIS — M5441 Lumbago with sciatica, right side: Secondary | ICD-10-CM | POA: Diagnosis not present

## 2023-02-26 MED ORDER — PREDNISONE 10 MG (21) PO TBPK: ORAL_TABLET | ORAL | 0 refills | Status: DC

## 2023-02-26 MED ORDER — FENTANYL CITRATE PF 50 MCG/ML IJ SOSY
50.0000 ug | PREFILLED_SYRINGE | Freq: Once | INTRAMUSCULAR | Status: AC
  Administered 2023-02-26: 50 ug via INTRAMUSCULAR
  Filled 2023-02-26: qty 1

## 2023-02-26 MED ORDER — PREDNISONE 50 MG PO TABS
60.0000 mg | ORAL_TABLET | Freq: Once | ORAL | Status: AC
  Administered 2023-02-26: 60 mg via ORAL
  Filled 2023-02-26: qty 1

## 2023-02-26 MED ORDER — OXYCODONE-ACETAMINOPHEN 5-325 MG PO TABS
1.0000 | ORAL_TABLET | Freq: Once | ORAL | Status: AC
  Administered 2023-02-26: 1 via ORAL
  Filled 2023-02-26: qty 1

## 2023-02-26 NOTE — ED Provider Notes (Signed)
[place] EM[facility] Provider Note   CSN: [id number] Arrival date & time: 02/26/23  1521     History  Chief Complaint  Patient presents with   Back Pain    Phillip Hughes is a 67 y.o. male who presents to the ED with concerns for right lower back pain onset last night. His sciatica radiates to his right leg. Notes that he has had sciatica concerns x 4 years. Is in PT and seeing sports medicine for his chronic pain. Notes that he was seen in the emergency department on yesterday and return to the emergency department today due to not receiving any pain medication yesterday. Denies any new injury, trauma, fall, heavy lifting.  Denies bowel/bladder incontinence, numbness, tingling, weakness, urinary symptoms.   Per patient chart review: Patient was evaluated in the emergency department on 02/26/2023 for similar symptoms.  At that time patient was sent home with a prescription for prednisone.  The history is provided by the patient. No language interpreter was used.       Home Medications Prior to Admission medications   Medication Sig Start Date End Date Taking? Authorizing Provider  atorvastatin (LIPITOR) 20 MG tablet Take 1 tablet by mouth daily. 11/02/20   [provider]  celecoxib (CELEBREX) 200 MG capsule Take 1 capsule (200 mg total) by mouth 2 (two) times daily as needed. 11/29/22   Rosemarie Ax, MD  diclofenac Sodium (VOLTAREN) 1 % GEL Apply 2 g topically 4 (four) times daily as needed. 11/28/22   Long, Wonda Olds, MD  hydrochlorothiazide (HYDRODIURIL) 25 MG tablet Take 1 tablet (25 mg total) by mouth daily. 03/21/21   Palumbo, April, MD  HYDROcodone-acetaminophen (NORCO) 5-325 MG tablet Take 1 tablet by mouth every 8 (eight) hours as needed for moderate pain. 02/10/23   Rosemarie Ax, MD  predniSONE (STERAPRED UNI-PAK 21 TAB) 10 MG (21) TBPK tablet 10mg  Tabs, 6 day taper. Use as directed 02/26/23   Truddie Hidden, MD       Allergies    Gabapentin and Statins    Review of Systems   Review of Systems  Musculoskeletal:  Positive for back pain.  All other systems reviewed and are negative.   Physical Exam Updated Vital Signs BP (!) 158/105 (BP Location: Left Arm)   Pulse (!) 115   Temp 98.4 F (36.9 C) (Oral)   Resp 18   Ht 5\' 7"  (1.702 m)   Wt 73.4 kg   SpO2 94%   BMI 25.34 kg/m  Physical Exam Vitals and nursing note reviewed.  Constitutional:      General: He is not in acute distress.    Appearance: Normal appearance.  Eyes:     General: No scleral icterus.    Extraocular Movements: Extraocular movements intact.  Cardiovascular:     Rate and Rhythm: Normal rate.  Pulmonary:     Effort: Pulmonary effort is normal. No respiratory distress.  Abdominal:     Palpations: Abdomen is soft. There is no mass.     Tenderness: There is no abdominal tenderness.  Musculoskeletal:        General: Normal range of motion.     Cervical back: Neck supple.     Comments: No spinal TTP. No TTP noted to musculature of back. Positive SLR on the right. Negative SLR on left.   Skin:    General: Skin is warm and dry.     Findings: No rash.  Neurological:  Mental Status: He is alert.     Sensory: Sensation is intact.     Motor: Motor function is intact.  Psychiatric:        Behavior: Behavior normal.     ED Results / Procedures / Treatments   Labs (all labs ordered are listed, but only abnormal results are displayed) Labs Reviewed - No data to display  EKG None  Radiology No results found.  Procedures Procedures    Medications Ordered in ED Medications  oxyCODONE-acetaminophen (PERCOCET/ROXICET) 5-325 MG per tablet 1 tablet (1 tablet Oral Given 02/26/23 1833)    ED Course/ Medical Decision Making/ A&P Clinical Course as of 02/26/23 1835  Sat Feb 26, 2023  1820 In-depth conversation held with patient at bedside regarding discharge treatment plan consistent of following up with her  sports medicine doctor.  Patient then notes "I am not going to be sent something home for pain?"  Discussed with patient that according to our PDMP, patient has received several narcotic prescriptions within the past 3 months from several different providers and it is important that he follows up with his sports medicine doctor for management of his chronic pain that has been ongoing for the past 4 years.  Patient then voices frustrations with plan of going home and continuation of prednisone.  Patient states "Is there a doctor here that I am going to see?"  Discussed with patient that I am the provider taking care of him and that in the emergency department we are not able to manage chronic pain with narcotics. Patient notes "I know what I am going to do, I am just going to come back tomorrow." [SB]    Clinical Course User Index [SB] Kaeson Kleinert A, PA-C                             Medical Decision Making  Patient with right lumbar back pain with radiation to RLE. Pt with history of sciatica x 4 years. No neurological deficits and normal neuro exam. No loss of bowel or bladder control.  No concern for cauda equina.  Pt afebrile. On exam, patient with No spinal TTP. No TTP noted to musculature of back. Positive SLR on the right. Negative SLR on left. Differential diagnosis includes but is not limited to fracture, herniation, muscle strain, cauda equina, pancreatitis, appendicitis, pyelonephritis, nephrolithiasis.    Additional history obtained:  External records from outside source obtained and reviewed including: Patient was evaluated in the emergency department on 02/26/23 ~12 hours from this ED visit for similar concerns.   Medications:  I ordered medication including percocet for pain management I have reviewed the patients home medicines and have made adjustments as needed   Disposition: Presentation suspicious for sciatica exacerbation. Doubt concerns at this time for fracture,  herniation, cauda equina syndrome. After consideration of the diagnostic results and the patients response to treatment, I feel that the patient would benefit from Discharge home. Advised patient to follow up with his sports medicine doctor and care team. Advised patient to continue with prednisone as prescribed. Discussed importance of follow-up with primary care provider for management.  Supportive care measures and strict return precautions discussed with patient at bedside. Pt acknowledges and verbalizes understanding. Pt appears safe for discharge. Follow up as indicated in discharge paperwork.    This chart was dictated using voice recognition software, Dragon. Despite the best efforts of this provider to proofread and correct errors, errors may still  occur which can change documentation meaning.   Final Clinical Impression(s) / ED Diagnoses Final diagnoses:  Sciatica of right side    Rx / DC Orders ED Discharge Orders     None         Cerria Randhawa A, PA-C 02/26/23 1835    Tegeler, Gwenyth Allegra, MD 02/26/23 2237

## 2023-02-26 NOTE — ED Triage Notes (Signed)
Pt states is back from last night for his sciatica pain down right leg, states shot last night helped but dr did not give him anything for pain  Started prednisone today

## 2023-02-26 NOTE — ED Notes (Signed)
Pt discharged to home. Discharge instructions have been discussed with patient and/or family members. Pt verbally acknowledges understanding d/c instructions, and endorses comprehension to checkout at registration before leaving.  °

## 2023-02-26 NOTE — ED Notes (Signed)
ED Provider at bedside. 

## 2023-02-26 NOTE — Discharge Instructions (Signed)
Call your sports medicine doctor to set up a follow up appointment regarding todays ED visit. Continue the course of prednisone that you are prescribed.  You may apply ice or heat to the affected area for up to 15 minutes at a time.  Ensure to place a barrier between your skin and the ice or heat. Return to the Emergency Department if you are experiencing loss of bowel or bladder, increasing/worsening symptoms, fever, inability to walk.

## 2023-02-26 NOTE — ED Provider Notes (Signed)
[place] [facility] [place]  Provider Note  CSN: [id number] Arrival date & time: 02/25/23 2337  History Chief Complaint  Patient presents with   Sciatica    Phillip Hughes is a 67 y.o. male with history of prior lumbar surgery, occasional sciatica pain and ongoing R knee pain reports 2 days of pain in R knee and shin, now with pain from the hip all the way down. Unable to get comfortable. No fever, weakness or numbness. No bowel or bladder problems. No history of IVDA. He is scheduled for an MRI of his knee. Has not had lumbar imaging recently. Seen for similar in December and had some relief with a steroid dose pack. He has NSAIDs at home.    Home Medications Prior to Admission medications   Medication Sig Start Date End Date Taking? Authorizing Provider  predniSONE (STERAPRED UNI-PAK 21 TAB) 10 MG (21) TBPK tablet 10mg  Tabs, 6 day taper. Use as directed 02/26/23  Yes Truddie Hidden, MD  atorvastatin (LIPITOR) 20 MG tablet Take 1 tablet by mouth daily. 11/02/20   [provider]  celecoxib (CELEBREX) 200 MG capsule Take 1 capsule (200 mg total) by mouth 2 (two) times daily as needed. 11/29/22   Rosemarie Ax, MD  diclofenac Sodium (VOLTAREN) 1 % GEL Apply 2 g topically 4 (four) times daily as needed. 11/28/22   Long, Wonda Olds, MD  hydrochlorothiazide (HYDRODIURIL) 25 MG tablet Take 1 tablet (25 mg total) by mouth daily. 03/21/21   Palumbo, April, MD  HYDROcodone-acetaminophen (NORCO) 5-325 MG tablet Take 1 tablet by mouth every 8 (eight) hours as needed for moderate pain. 02/10/23   Rosemarie Ax, MD     Allergies    Gabapentin and Statins   Review of Systems   Review of Systems Please see HPI for pertinent positives and negatives  Physical Exam BP (!) 168/86 (BP Location: Left Arm)   Pulse (!) 102   Temp 98.3 F (36.8 C) (Oral)   Resp 20   Ht 5\' 7"  (1.702 m)   Wt 73.5 kg   SpO2 100%   BMI 25.37 kg/m   Physical  Exam Vitals and nursing note reviewed.  Constitutional:      Appearance: Normal appearance.  HENT:     Head: Normocephalic and atraumatic.     Nose: Nose normal.     Mouth/Throat:     Mouth: Mucous membranes are moist.  Eyes:     Extraocular Movements: Extraocular movements intact.     Conjunctiva/sclera: Conjunctivae normal.  Cardiovascular:     Rate and Rhythm: Normal rate.  Pulmonary:     Effort: Pulmonary effort is normal.     Breath sounds: Normal breath sounds.  Abdominal:     General: Abdomen is flat.     Palpations: Abdomen is soft.     Tenderness: There is no abdominal tenderness.  Musculoskeletal:        General: Tenderness (R sciatic notch, mild) present. No swelling, deformity or signs of injury. Normal range of motion.     Cervical back: Neck supple.  Skin:    General: Skin is warm and dry.  Neurological:     General: No focal deficit present.     Mental Status: He is alert and oriented to person, place, and time.     Cranial Nerves: No cranial nerve deficit.     Motor: No weakness.  Psychiatric:        Mood and Affect: Mood normal.  ED Results / Procedures / Treatments   EKG None  Procedures Procedures  Medications Ordered in the ED Medications  fentaNYL (SUBLIMAZE) injection 50 mcg (has no administration in time range)  predniSONE (DELTASONE) tablet 60 mg (has no administration in time range)    Initial Impression and Plan  Patient here with likely sciatica pain vs referred pain from his knee. Will give IM pain medication for comfort and start a course of steroids. Recommend close outpatient follow up with Dr. Raeford Razor. RTED for any other concerns. He does not have any red flags for complications.   ED Course       MDM Rules/Calculators/A&P Medical Decision Making Problems Addressed: Sciatica of right side: chronic illness or injury with exacerbation, progression, or side effects of treatment  Risk Prescription drug  management.     Final Clinical Impression(s) / ED Diagnoses Final diagnoses:  Sciatica of right side    Rx / DC Orders ED Discharge Orders          Ordered    predniSONE (STERAPRED UNI-PAK 21 TAB) 10 MG (21) TBPK tablet        02/26/23 0017             Truddie Hidden, MD 02/26/23 [phone]

## 2023-02-26 NOTE — ED Notes (Signed)
Rx x 1 given   Written and verbal inst to pt  Verbalized an understanding  To home with spouse  

## 2023-02-28 ENCOUNTER — Telehealth: Payer: Self-pay

## 2023-02-28 ENCOUNTER — Ambulatory Visit (INDEPENDENT_AMBULATORY_CARE_PROVIDER_SITE_OTHER): Payer: Medicare (Managed Care) | Admitting: Family Medicine

## 2023-02-28 ENCOUNTER — Other Ambulatory Visit (HOSPITAL_BASED_OUTPATIENT_CLINIC_OR_DEPARTMENT_OTHER): Payer: Self-pay

## 2023-02-28 VITALS — BP 136/78 | Ht 67.0 in | Wt 162.0 lb

## 2023-02-28 DIAGNOSIS — Z96641 Presence of right artificial hip joint: Secondary | ICD-10-CM

## 2023-02-28 DIAGNOSIS — M5416 Radiculopathy, lumbar region: Secondary | ICD-10-CM | POA: Diagnosis not present

## 2023-02-28 DIAGNOSIS — M23203 Derangement of unspecified medial meniscus due to old tear or injury, right knee: Secondary | ICD-10-CM | POA: Diagnosis not present

## 2023-02-28 MED ORDER — HYDROCODONE-ACETAMINOPHEN 5-325 MG PO TABS
1.0000 | ORAL_TABLET | Freq: Three times a day (TID) | ORAL | 0 refills | Status: DC | PRN
  Filled 2023-02-28: qty 15, 5d supply, fill #0

## 2023-02-28 NOTE — Assessment & Plan Note (Signed)
Acute on chronic in nature.  He is unable to stand on his right leg due to instability of the right hip.  This would make sense as to why his right sided sciatica flares as well as contributing to the right knee pain.  Previous x-ray from March 2023 showed possible signs of loosening. -Counseled on home exercise therapy and supportive care. -Pursue nuclear bone scan to check for loosening hardware and for presurgical planning. -May need to consider further imaging.

## 2023-02-28 NOTE — Assessment & Plan Note (Addendum)
Acute on chronic in nature.  Was severe over the past weekend.  This could be exacerbated with his right hip arthroplasty. -Counseled on home exercise therapy and supportive care. -Could pursue epidural. - norco

## 2023-02-28 NOTE — Progress Notes (Signed)
  Phillip Hughes - 67 y.o. male MRN [id number]  Date of birth: [birth year]  SUBJECTIVE:  Including CC & ROS.  No chief complaint on file.   Phillip Hughes is a 67 y.o. male that is presenting with an acute exacerbation of his right leg pain, his right knee pain and hip pain.  His right leg pain has improved with the use of the prednisone.  He was seen in the emergency department on 3/15 and 3/16 for this right-sided sciatica pain.  He did receive improvement with the right knee pain.  Review of the hip x-ray from 03/12/2022 shows lucency along the stem of the right hip arthroplasty.    Review of Systems See HPI   HISTORY: Past Medical, Surgical, Social, and Family History Reviewed & Updated per EMR.   Pertinent Historical Findings include:  Past Medical History:  Diagnosis Date   Hypertension    Sciatica    Sinusitis     Past Surgical History:  Procedure Laterality Date   BACK SURGERY     HERNIA REPAIR     HIP SURGERY     NECK SURGERY       PHYSICAL EXAM:  VS: BP 136/78   Ht 5\' 7"  (1.702 m)   Wt 162 lb (73.5 kg)   BMI 25.37 kg/m  Physical Exam Gen: NAD, alert, cooperative with exam, well-appearing MSK:  Neurovascularly intact       ASSESSMENT & PLAN:   Degenerative tear of medial meniscus of right knee Acutely occurring.  He has received some improvement with previous injections. -Counseled on supportive care -Has an MRI pending.  Hx of total hip arthroplasty, right Acute on chronic in nature.  He is unable to stand on his right leg due to instability of the right hip.  This would make sense as to why his right sided sciatica flares as well as contributing to the right knee pain.  Previous x-ray from March 2023 showed possible signs of loosening. -Counseled on home exercise therapy and supportive care. -Pursue nuclear bone scan to check for loosening hardware and for presurgical planning. -May need to consider further imaging.  Lumbar radiculopathy Acute on  chronic in nature.  Was severe over the past weekend.  This could be exacerbated with his right hip arthroplasty. -Counseled on home exercise therapy and supportive care. -Could pursue epidural. - norco

## 2023-02-28 NOTE — Telephone Encounter (Signed)
Patient is scheduled for the bone scan phase three at [facility] and would like to know once it is authorized through insurance.

## 2023-02-28 NOTE — Patient Instructions (Addendum)
Good to see you We will pursue an bone scan to check the right hip for any loosening [facility] Scheduling [phone]) Please use the pain medicine as needed  Please send me a message in MyChart with any questions or updates.  We'll setup a virtual visit once the MRI of the knee is completed .   --Dr. Raeford Razor

## 2023-02-28 NOTE — Assessment & Plan Note (Signed)
Acutely occurring.  He has received some improvement with previous injections. -Counseled on supportive care -Has an MRI pending.

## 2023-03-01 ENCOUNTER — Telehealth: Payer: Self-pay | Admitting: *Deleted

## 2023-03-01 ENCOUNTER — Telehealth: Payer: Self-pay | Admitting: Medical

## 2023-03-01 ENCOUNTER — Encounter: Payer: Medicare (Managed Care) | Admitting: Physical Therapy

## 2023-03-01 NOTE — Telephone Encounter (Signed)
Pt called stating that the surgeon and facility is considered OON for his insurance and needed to have the referral for the mass excision on his right shoulder rerouted to the following surgeon:  Dr. Nelta Numbers [place], [place]

## 2023-03-01 NOTE — Telephone Encounter (Signed)
-----   Message from Elberta Spaniel sent at 03/01/2023 10:36 AM EDT ----- Regarding: Pt cld back w/ Bone Scan info to [facility]  Per pt's ins prefers [facility] for Bone Scan, pls check precert requirements & fax order to [phone].   [facility] address:   [address] [place]  P) [phone]  Thx

## 2023-03-01 NOTE — Telephone Encounter (Signed)
Pt called stating that he has to go through the hospital for the referral and to disregard any previous info provided:  [facility] [address] [place], [place] [address] P: [phone] F: [phone]

## 2023-03-01 NOTE — Telephone Encounter (Signed)
Pt called to provide update surgeon information. Please send referral to :   Cindi Carbon [address] [place] [address] [phone]  Pt has other options should this one not work out, just call him back.

## 2023-03-01 NOTE — Telephone Encounter (Signed)
Order updated. See imaging.

## 2023-03-01 NOTE — Addendum Note (Signed)
Addended by: Cresenciano Lick on: 03/01/2023 01:01 PM   Modules accepted: Orders

## 2023-03-03 ENCOUNTER — Ambulatory Visit: Payer: Medicare (Managed Care) | Admitting: Family Medicine

## 2023-03-07 ENCOUNTER — Encounter (HOSPITAL_COMMUNITY): Payer: Medicare (Managed Care)

## 2023-03-07 ENCOUNTER — Other Ambulatory Visit (HOSPITAL_COMMUNITY): Payer: Medicare (Managed Care)

## 2023-03-08 ENCOUNTER — Other Ambulatory Visit (HOSPITAL_BASED_OUTPATIENT_CLINIC_OR_DEPARTMENT_OTHER): Payer: Self-pay

## 2023-03-08 ENCOUNTER — Ambulatory Visit: Payer: Medicare (Managed Care) | Admitting: Family Medicine

## 2023-03-08 ENCOUNTER — Encounter: Payer: Self-pay | Admitting: Family Medicine

## 2023-03-08 VITALS — BP 148/88 | Ht 67.0 in | Wt 162.0 lb

## 2023-03-08 DIAGNOSIS — Z96641 Presence of right artificial hip joint: Secondary | ICD-10-CM

## 2023-03-08 DIAGNOSIS — M23203 Derangement of unspecified medial meniscus due to old tear or injury, right knee: Secondary | ICD-10-CM

## 2023-03-08 MED ORDER — KETOROLAC TROMETHAMINE 30 MG/ML IJ SOLN
30.0000 mg | Freq: Once | INTRAMUSCULAR | Status: AC
  Administered 2023-03-08: 30 mg via INTRAMUSCULAR

## 2023-03-08 MED ORDER — OXYCODONE-ACETAMINOPHEN 7.5-325 MG PO TABS
1.0000 | ORAL_TABLET | Freq: Three times a day (TID) | ORAL | 0 refills | Status: DC | PRN
  Filled 2023-03-08: qty 15, 5d supply, fill #0

## 2023-03-08 NOTE — Assessment & Plan Note (Signed)
Acute on chronic in nature.  Having severe pain at the knee that seems most consistent with referred pain from the hip. -Counseled on home exercise therapy and supportive care. -MRI is pending.

## 2023-03-08 NOTE — Patient Instructions (Addendum)
Good to see you Please use heat as needed  Please continue the crutches  Please send me a message in MyChart with any questions or updates.  We'll setup a virtual visit once the imaging is completed.   --Dr. Raeford Razor '

## 2023-03-08 NOTE — Progress Notes (Signed)
  Phillip Hughes - 67 y.o. male MRN [id number]  Date of birth: 07-Oct-1956  SUBJECTIVE:  Including CC & ROS.  No chief complaint on file.   Phillip Hughes is a 67 y.o. male that is presenting with severe right hip upper leg and knee pain.  The pain is similar to the pain he has been experiencing.  He has a MRI scheduled for April 1 of the knee.  We are awaiting to obtain a bone scan of the right hip to evaluate for loosening.   Review of Systems See HPI   HISTORY: Past Medical, Surgical, Social, and Family History Reviewed & Updated per EMR.   Pertinent Historical Findings include:  Past Medical History:  Diagnosis Date   Hypertension    Sciatica    Sinusitis     Past Surgical History:  Procedure Laterality Date   BACK SURGERY     HERNIA REPAIR     HIP SURGERY     NECK SURGERY       PHYSICAL EXAM:  VS: BP (!) 148/88 (BP Location: Left Arm, Patient Position: Sitting)   Ht 5\' 7"  (1.702 m)   Wt 162 lb (73.5 kg)   BMI 25.37 kg/m  Physical Exam Gen: NAD, alert, cooperative with exam, well-appearing MSK:  Neurovascularly intact       ASSESSMENT & PLAN:   Degenerative tear of medial meniscus of right knee Acute on chronic in nature.  Having severe pain at the knee that seems most consistent with referred pain from the hip. -Counseled on home exercise therapy and supportive care. -MRI is pending.  Hx of total hip arthroplasty, right I feel that most all of his pain is associated with his right hip in terms of the radicular type pain as well as the right knee.  Especially with how severe his pain is. -Counseled on home exercise therapy and supportive care. -Toradol IM. -percocet -Has a bone scan pending to check for loosening of the surgical hardware

## 2023-03-08 NOTE — Assessment & Plan Note (Signed)
I feel that most all of his pain is associated with his right hip in terms of the radicular type pain as well as the right knee.  Especially with how severe his pain is. -Counseled on home exercise therapy and supportive care. -Toradol IM. -percocet -Has a bone scan pending to check for loosening of the surgical hardware

## 2023-03-10 ENCOUNTER — Ambulatory Visit: Payer: Medicare (Managed Care) | Admitting: Family Medicine

## 2023-03-14 DIAGNOSIS — M25561 Pain in right knee: Secondary | ICD-10-CM | POA: Diagnosis not present

## 2023-03-14 DIAGNOSIS — M23203 Derangement of unspecified medial meniscus due to old tear or injury, right knee: Secondary | ICD-10-CM | POA: Diagnosis not present

## 2023-03-16 ENCOUNTER — Telehealth: Payer: Self-pay | Admitting: Medical

## 2023-03-16 DIAGNOSIS — Z471 Aftercare following joint replacement surgery: Secondary | ICD-10-CM | POA: Diagnosis not present

## 2023-03-16 DIAGNOSIS — Z96642 Presence of left artificial hip joint: Secondary | ICD-10-CM | POA: Diagnosis not present

## 2023-03-16 NOTE — Telephone Encounter (Signed)
Copied from [place] [phone]. Topic: Medicare AWV >> Mar 16, 2023 11:40 AM Devoria Glassing wrote: Reason for CRM: Called patient to schedule Medicare Annual Wellness Visit (AWV). Left message for patient to call back and schedule Medicare Annual Wellness Visit (AWV).  Last date of AWV: NONE  Please schedule an appointment at any time with Beatris Ship, CMA    If any questions, please contact me.  Thank you ,  Sherol Dade; [place] Direct Dial: [phone]

## 2023-03-17 ENCOUNTER — Telehealth: Payer: Self-pay | Admitting: Medical

## 2023-03-17 ENCOUNTER — Ambulatory Visit (INDEPENDENT_AMBULATORY_CARE_PROVIDER_SITE_OTHER): Payer: Medicare (Managed Care) | Admitting: Family Medicine

## 2023-03-17 ENCOUNTER — Encounter: Payer: Self-pay | Admitting: Family Medicine

## 2023-03-17 ENCOUNTER — Other Ambulatory Visit (HOSPITAL_BASED_OUTPATIENT_CLINIC_OR_DEPARTMENT_OTHER): Payer: Self-pay

## 2023-03-17 VITALS — BP 130/70 | Ht 67.0 in | Wt 162.0 lb

## 2023-03-17 DIAGNOSIS — M23203 Derangement of unspecified medial meniscus due to old tear or injury, right knee: Secondary | ICD-10-CM | POA: Diagnosis not present

## 2023-03-17 DIAGNOSIS — M5416 Radiculopathy, lumbar region: Secondary | ICD-10-CM | POA: Diagnosis not present

## 2023-03-17 DIAGNOSIS — Z96641 Presence of right artificial hip joint: Secondary | ICD-10-CM

## 2023-03-17 MED ORDER — HYDROCHLOROTHIAZIDE 25 MG PO TABS: 25.0000 mg | ORAL_TABLET | Freq: Every day | ORAL | 3 refills | Status: DC

## 2023-03-17 MED ORDER — OXYCODONE-ACETAMINOPHEN 7.5-325 MG PO TABS
1.0000 | ORAL_TABLET | Freq: Three times a day (TID) | ORAL | 0 refills | Status: DC | PRN
  Filled 2023-03-17: qty 15, 5d supply, fill #0

## 2023-03-17 MED ORDER — HYDROCHLOROTHIAZIDE 25 MG PO TABS
25.0000 mg | ORAL_TABLET | Freq: Every day | ORAL | 3 refills | Status: DC
  Filled 2023-03-17: qty 30, 30d supply, fill #0

## 2023-03-17 NOTE — Addendum Note (Signed)
Addended by: Anabel Halon on: 03/17/2023 04:58 PM   Modules accepted: Orders

## 2023-03-17 NOTE — Telephone Encounter (Signed)
Prescription Request  03/17/2023  Is this a "Controlled Substance" medicine? Yes  LOV: 01/13/2023  What is the name of the medication or equipment?  hydrochlorothiazide (HYDRODIURIL) 25 MG tablet   (First time Percell Miller has filled it)   Have you contacted your pharmacy to request a refill? No   Which pharmacy would you like this sent to?  [facility] [address] [place] [address] Phone: [phone] Fax: [phone]    Patient notified that their request is being sent to the clinical staff for review and that they should receive a response within 2 business days.   Please advise at Mobile [phone] (mobile)

## 2023-03-17 NOTE — Assessment & Plan Note (Signed)
No loosening was observed on recent bone scan.

## 2023-03-17 NOTE — Assessment & Plan Note (Signed)
No significant structural changes appreciated on MRI of the right knee.

## 2023-03-17 NOTE — Addendum Note (Signed)
Addended by: Anabel Halon on: 03/17/2023 02:31 PM   Modules accepted: Orders

## 2023-03-17 NOTE — Progress Notes (Signed)
  Phillip Hughes - 67 y.o. male MRN [id number]  Date of birth: 07/15/1956  SUBJECTIVE:  Including CC & ROS.  No chief complaint on file.   Phillip Hughes is a 67 y.o. male that is following up after the MRI of his right knee and bone scan of his right hip.  The MRI of his right knee was not showing any structural changes.  Bone density is not showing any loosening of the right hip.  He continues to have right-sided hip and leg pain.   Review of Systems See HPI   HISTORY: Past Medical, Surgical, Social, and Family History Reviewed & Updated per EMR.   Pertinent Historical Findings include:  Past Medical History:  Diagnosis Date   Hypertension    Sciatica    Sinusitis     Past Surgical History:  Procedure Laterality Date   BACK SURGERY     HERNIA REPAIR     HIP SURGERY     NECK SURGERY       PHYSICAL EXAM:  VS: BP 130/70 (BP Location: Left Arm, Patient Position: Sitting)   Ht 5\' 7"  (1.702 m)   Wt 162 lb (73.5 kg)   BMI 25.37 kg/m  Physical Exam Gen: NAD, alert, cooperative with exam, well-appearing MSK:  Neurovascularly intact       ASSESSMENT & PLAN:   Lumbar radiculopathy Symptoms most consistent with a right-sided radiculopathy given his normal bone scan of his right hip and MRI of his right knee. -Counseled on home exercise therapy and supportive care -Norco. -Would pursue epidural  Hx of total hip arthroplasty, right No loosening was observed on recent bone scan.  Degenerative tear of medial meniscus of right knee No significant structural changes appreciated on MRI of the right knee.

## 2023-03-17 NOTE — Assessment & Plan Note (Signed)
Symptoms most consistent with a right-sided radiculopathy given his normal bone scan of his right hip and MRI of his right knee. -Counseled on home exercise therapy and supportive care -Norco. -Would pursue epidural

## 2023-03-17 NOTE — Telephone Encounter (Signed)
Pt called and lvm to return call to schedule an office for may and to make him aware of medication at pharmacy

## 2023-03-21 ENCOUNTER — Telehealth: Payer: Self-pay | Admitting: *Deleted

## 2023-03-21 DIAGNOSIS — M5416 Radiculopathy, lumbar region: Secondary | ICD-10-CM

## 2023-03-21 NOTE — Telephone Encounter (Signed)
Order faxed to number below.

## 2023-03-21 NOTE — Telephone Encounter (Signed)
-----   Message from [name] sent at 03/21/2023  9:57 AM EDT ----- Regarding: Pt says per [employer] he has to go to [facility] facility Per patient, his insurance([employer] ) says he has to go to [facility] for Epidural.  -- Please fax Epidural order to [email or web address] p) [phone]-- K)[phone]  ---- I do Not see an order for an Epidural in pt's chart but provider does mention it in 4/4 OV notes.   Thx

## 2023-03-22 DIAGNOSIS — M25811 Other specified joint disorders, right shoulder: Secondary | ICD-10-CM | POA: Diagnosis not present

## 2023-03-22 DIAGNOSIS — R222 Localized swelling, mass and lump, trunk: Secondary | ICD-10-CM | POA: Diagnosis not present

## 2023-03-22 DIAGNOSIS — I10 Essential (primary) hypertension: Secondary | ICD-10-CM | POA: Diagnosis not present

## 2023-03-22 DIAGNOSIS — E785 Hyperlipidemia, unspecified: Secondary | ICD-10-CM | POA: Diagnosis not present

## 2023-03-24 DIAGNOSIS — M545 Low back pain, unspecified: Secondary | ICD-10-CM | POA: Diagnosis not present

## 2023-03-24 DIAGNOSIS — M4317 Spondylolisthesis, lumbosacral region: Secondary | ICD-10-CM | POA: Diagnosis not present

## 2023-03-24 DIAGNOSIS — M47816 Spondylosis without myelopathy or radiculopathy, lumbar region: Secondary | ICD-10-CM | POA: Diagnosis not present

## 2023-03-25 ENCOUNTER — Encounter (HOSPITAL_BASED_OUTPATIENT_CLINIC_OR_DEPARTMENT_OTHER): Admission: RE | Payer: Self-pay | Source: Home / Self Care

## 2023-03-25 ENCOUNTER — Ambulatory Visit (HOSPITAL_BASED_OUTPATIENT_CLINIC_OR_DEPARTMENT_OTHER): Admission: RE | Admit: 2023-03-25 | Payer: Medicare (Managed Care) | Source: Home / Self Care | Admitting: Surgery

## 2023-03-25 SURGERY — EXCISION MASS
Anesthesia: Monitor Anesthesia Care | Laterality: Right

## 2023-03-28 ENCOUNTER — Encounter: Payer: Self-pay | Admitting: *Deleted

## 2023-03-28 ENCOUNTER — Ambulatory Visit: Payer: Medicare (Managed Care)

## 2023-03-28 DIAGNOSIS — M5416 Radiculopathy, lumbar region: Secondary | ICD-10-CM | POA: Diagnosis not present

## 2023-03-29 DIAGNOSIS — R2231 Localized swelling, mass and lump, right upper limb: Secondary | ICD-10-CM | POA: Diagnosis not present

## 2023-04-04 ENCOUNTER — Telehealth: Payer: Self-pay | Admitting: *Deleted

## 2023-04-04 NOTE — Telephone Encounter (Addendum)
-----   Message from [name] sent at 04/04/2023  1:50 PM EDT ----- Regarding: Pt says Epidural didn't work & he's in even More pain Pt called to see if Dr. Jordan Likes can either give him another Injection in his knee or call in a pain Rx. --- pt says  Epidural did not take ( they told him it may take a week or 2 to kick in) but he says in Excruciating pain  & needs some Relief..  Pt had Toradol injection on 3/26. Patient had epidural on 03/28/23. what can he have?

## 2023-04-05 ENCOUNTER — Other Ambulatory Visit: Payer: Self-pay

## 2023-04-05 ENCOUNTER — Other Ambulatory Visit (HOSPITAL_BASED_OUTPATIENT_CLINIC_OR_DEPARTMENT_OTHER): Payer: Self-pay

## 2023-04-05 MED ORDER — OXYCODONE-ACETAMINOPHEN 7.5-325 MG PO TABS
1.0000 | ORAL_TABLET | Freq: Three times a day (TID) | ORAL | 0 refills | Status: DC | PRN
  Filled 2023-04-05: qty 15, 5d supply, fill #0

## 2023-04-05 NOTE — Telephone Encounter (Signed)
Refilled percocet.   Myra Rude, MD [facility] 04/05/2023, 1:45 PM

## 2023-04-13 ENCOUNTER — Other Ambulatory Visit (HOSPITAL_BASED_OUTPATIENT_CLINIC_OR_DEPARTMENT_OTHER): Payer: Self-pay

## 2023-04-13 ENCOUNTER — Ambulatory Visit (INDEPENDENT_AMBULATORY_CARE_PROVIDER_SITE_OTHER): Payer: Medicare (Managed Care) | Admitting: Medical

## 2023-04-13 VITALS — BP 127/70 | HR 88 | Resp 18 | Ht 67.0 in | Wt 155.0 lb

## 2023-04-13 DIAGNOSIS — R42 Dizziness and giddiness: Secondary | ICD-10-CM | POA: Diagnosis not present

## 2023-04-13 DIAGNOSIS — I10 Essential (primary) hypertension: Secondary | ICD-10-CM

## 2023-04-13 DIAGNOSIS — M5416 Radiculopathy, lumbar region: Secondary | ICD-10-CM | POA: Diagnosis not present

## 2023-04-13 DIAGNOSIS — R252 Cramp and spasm: Secondary | ICD-10-CM | POA: Diagnosis not present

## 2023-04-13 DIAGNOSIS — R5383 Other fatigue: Secondary | ICD-10-CM | POA: Diagnosis not present

## 2023-04-13 DIAGNOSIS — E785 Hyperlipidemia, unspecified: Secondary | ICD-10-CM

## 2023-04-13 LAB — CBC WITH DIFFERENTIAL/PLATELET
Basophils Absolute: 0 10*3/uL (ref 0.0–0.1)
Basophils Relative: 1 % (ref 0.0–3.0)
Eosinophils Absolute: 0.1 10*3/uL (ref 0.0–0.7)
Eosinophils Relative: 1.6 % (ref 0.0–5.0)
HCT: 36.7 % — ABNORMAL LOW (ref 39.0–52.0)
Hemoglobin: 12.3 g/dL — ABNORMAL LOW (ref 13.0–17.0)
Lymphocytes Relative: 50 % — ABNORMAL HIGH (ref 12.0–46.0)
Lymphs Abs: 2 10*3/uL (ref 0.7–4.0)
MCHC: 33.4 g/dL (ref 30.0–36.0)
MCV: 87 fl (ref 78.0–100.0)
Monocytes Absolute: 0.6 10*3/uL (ref 0.1–1.0)
Monocytes Relative: 15.8 % — ABNORMAL HIGH (ref 3.0–12.0)
Neutro Abs: 1.3 10*3/uL — ABNORMAL LOW (ref 1.4–7.7)
Neutrophils Relative %: 31.6 % — ABNORMAL LOW (ref 43.0–77.0)
Platelets: 230 10*3/uL (ref 150.0–400.0)
RBC: 4.22 Mil/uL (ref 4.22–5.81)
RDW: 13 % (ref 11.5–15.5)
WBC: 4 10*3/uL (ref 4.0–10.5)

## 2023-04-13 LAB — COMPREHENSIVE METABOLIC PANEL
ALT: 33 U/L (ref 0–53)
AST: 33 U/L (ref 0–37)
Albumin: 3.8 g/dL (ref 3.5–5.2)
Alkaline Phosphatase: 67 U/L (ref 39–117)
BUN: 10 mg/dL (ref 6–23)
CO2: 28 mEq/L (ref 19–32)
Calcium: 9.4 mg/dL (ref 8.4–10.5)
Chloride: 101 mEq/L (ref 96–112)
Creatinine, Ser: 0.83 mg/dL (ref 0.40–1.50)
GFR: 90.15 mL/min (ref >60.00)
Glucose, Bld: 87 mg/dL (ref 70–99)
Potassium: 3.5 mEq/L (ref 3.5–5.1)
Sodium: 138 mEq/L (ref 135–145)
Total Bilirubin: 0.7 mg/dL (ref 0.2–1.2)
Total Protein: 7 g/dL (ref 6.0–8.3)

## 2023-04-13 LAB — LIPID PANEL
Cholesterol: 162 mg/dL (ref 0–200)
HDL: 42.1 mg/dL (ref >39.00)
LDL Cholesterol: 92 mg/dL (ref 0–99)
NonHDL: 119.64
Total CHOL/HDL Ratio: 4
Triglycerides: 136 mg/dL (ref 0.0–149.0)
VLDL: 27.2 mg/dL (ref 0.0–40.0)

## 2023-04-13 LAB — VITAMIN B12: Vitamin B-12: 178 pg/mL — ABNORMAL LOW (ref 211–911)

## 2023-04-13 LAB — IRON: Iron: 90 ug/dL (ref 42–165)

## 2023-04-13 MED ORDER — ROSUVASTATIN CALCIUM 10 MG PO TABS
10.0000 mg | ORAL_TABLET | Freq: Every day | ORAL | 3 refills | Status: DC
  Filled 2023-04-13: qty 90, 90d supply, fill #0
  Filled 2023-07-21: qty 90, 90d supply, fill #1

## 2023-04-13 MED ORDER — HYDROCHLOROTHIAZIDE 25 MG PO TABS
25.0000 mg | ORAL_TABLET | Freq: Every day | ORAL | 3 refills | Status: DC
  Filled 2023-04-13: qty 90, 90d supply, fill #0
  Filled 2023-10-14: qty 90, 90d supply, fill #1
  Filled 2024-02-03: qty 90, 90d supply, fill #2

## 2023-04-13 NOTE — Addendum Note (Signed)
Addended by: Gwenevere Abbot on: 04/13/2023 07:40 PM   Modules accepted: Orders

## 2023-04-13 NOTE — Progress Notes (Signed)
Subjective:    Patient ID: Phillip Hughes, male    DOB: [birth year], 67 y.o.   MRN: [id number]  HPI  Pt in for follow up. Last visit below AVS.  "1. Lipoma of other specified sites Placed referral for both areas. If you don't get call by 7-10 days let me know. Also let me know when your appointment is. - Ambulatory referral to General Surgery   2. Cramp in muscle Brief but since on hctz need to check labs/k level. - Comp Met (CMET) - Magnesium   3. Hypertension, unspecified type Continue hctz. Bp better on recheck.   4. Hyperlipidemia, unspecified hyperlipidemia type  Continue atorvastatin. "   "Pt update me that surgeon office never got prior auth for mri of shoulder. Had referred for  Rt side lower back 2.5 cm probable lipoma. Rt shoulder region probable 4 cm lipoma. Evaluate and treat. If I need to refer to ortho to evaluate shoulder region lipoma will."   Pt reports his leg cramps went away.  Pt has low back pain. Some radiating to rt leg. Pt has gotten epidural.  Delaine Lame., MD - 03/28/2023 10:30 AM EDT Formatting of this note might be different from the original. PROCEDURE  1. A right L4-L5 transforaminal epidural injection under fluoroscopic guidance.  2. Use of fluoroscopy was required to insure adequate delivery of medication into the epidural space and around the spinal nerve.  The patient was monitored with continuous pulse oximetry during the procedure.  DIAGNOSIS/INDICATIONS FOR THE PROCEDURE: Patient with LBP and radiculitis. Due to their pain they were set up for this injection. For specific information please refer to their history and physical and request for this. 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  IMPRESSION/RESULTS: The patient tolerated the procedure well without any complications. Hopefully he will have a therapeutic improvement from this.     Pt has been checking his bp at home. He states bp  in 130/80 or lower.   For high cholesterol pt states he stopped atorvastatin. He states he noticed it made him fatigue. He wants to consider getting different statin.   Last night woke up twice briefly around 3 am. Felt room spin for second then resolved. Work up this am no motor  or sensory deficits.  No ha. Hx of vertigo once in 2016.      Review of Systems  Constitutional:  Negative for chills, fatigue and fever.  HENT:  Negative for congestion, dental problem and ear discharge.   Respiratory:  Negative for cough, chest tightness, shortness of breath and wheezing.   Cardiovascular:  Negative for chest pain and palpitations.  Gastrointestinal:  Negative for abdominal pain, blood in stool and nausea.  Musculoskeletal:  Positive for back pain.       Rt lower ext and hip pain. Specialist determined pain from l4.  Skin:  Negative for rash.  Neurological:  Negative for dizziness, seizures and light-headedness.       No dizziness  presenlty see hpi.  Hematological:  Negative for adenopathy. Bruises/bleeds easily.  Psychiatric/Behavioral:  Negative for behavioral problems and confusion.      Past Medical History:  Diagnosis Date   Hypertension    Sciatica    Sinusitis      Social History   Socioeconomic History   Marital status: Married    Spouse name: Not on file   Number of children: Not on file   Years of education: Not on file  Highest education level: 11th grade  Occupational History   Not on file  Tobacco Use   Smoking status: Former    Packs/day: 1.00    Years: 28.00    Additional pack years: 0.00    Total pack years: 28.00    Types: Cigarettes   Smokeless tobacco: Never  Vaping Use   Vaping Use: Never used  Substance and Sexual Activity   Alcohol use: Yes    Comment: rarely   Drug use: No   Sexual activity: Not on file  Other Topics Concern   Not on file  Social History Narrative   Not on file   Social Determinants of Health   Financial Resource  Strain: Low Risk  (04/13/2023)   Overall Financial Resource Strain (CARDIA)    Difficulty of Paying Living Expenses: Not hard at all  Food Insecurity: No Food Insecurity (04/13/2023)   Hunger Vital Sign    Worried About Running Out of Food in the Last Year: Never true    Ran Out of Food in the Last Year: Never true  Transportation Needs: No Transportation Needs (04/13/2023)   PRAPARE - Administrator, Civil Service (Medical): No    Lack of Transportation (Non-Medical): No  Physical Activity: Unknown (04/13/2023)   Exercise Vital Sign    Days of Exercise per Week: Patient declined    Minutes of Exercise per Session: Not on file  Stress: No Stress Concern Present (04/13/2023)   [employer] of Occupational Health - Occupational Stress Questionnaire    Feeling of Stress : Not at all  Social Connections: Unknown (04/13/2023)   Social Connection and Isolation Panel [NHANES]    Frequency of Communication with Friends and Family: More than three times a week    Frequency of Social Gatherings with Friends and Family: Once a week    Attends Religious Services: Patient declined    Database administrator or Organizations: No    Attends Engineer, structural: Not on file    Marital Status: Married  Catering manager Violence: Not on file    Past Surgical History:  Procedure Laterality Date   BACK SURGERY     HERNIA REPAIR     HIP SURGERY     NECK SURGERY      No family history on file.  Allergies  Allergen Reactions   Gabapentin Other (See Comments)    Mood Swings and Depression Mood Swings and Depression Mood Swings and Depression Mood Swings and Depression    Statins Other (See Comments)    Current Outpatient Medications on File Prior to Visit  Medication Sig Dispense Refill   atorvastatin (LIPITOR) 20 MG tablet Take 1 tablet by mouth daily.     celecoxib (CELEBREX) 200 MG capsule Take 1 capsule (200 mg total) by mouth 2 (two) times daily as needed. 60 capsule 2    diclofenac Sodium (VOLTAREN) 1 % GEL Apply 2 g topically 4 (four) times daily as needed. 100 g 0   hydrochlorothiazide (HYDRODIURIL) 25 MG tablet Take 1 tablet (25 mg total) by mouth daily. 30 tablet 3   oxyCODONE-acetaminophen (PERCOCET) 7.5-325 MG tablet Take 1 tablet by mouth every 8 (eight) hours as needed. 15 tablet 0   No current facility-administered medications on file prior to visit.    BP 127/70   Pulse 88   Resp 18   Ht 5\' 7"  (1.702 m)   Wt 155 lb (70.3 kg)   SpO2 100%   BMI 24.28 kg/m  Objective:   Physical Exam  General Appearance- Not in acute distress.    Chest and Lung Exam Auscultation: Breath sounds:-Normal. Clear even and unlabored. Adventitious sounds:- No Adventitious sounds.  Cardiovascular Auscultation:Rythm - Regular, rate and rythm. Heart Sounds -Normal heart sounds.  Abdomen Inspection:-Inspection Normal.  Palpation/Perucssion: Palpation and Percussion of the abdomen reveal- Non Tender, No Rebound tenderness, No rigidity(Guarding) and No Palpable abdominal masses.  Liver:-Normal.  Spleen:- Normal.   Back Mid lumbar spine tenderness to palpation. Pain on straight leg lift. Pain on lateral movements and flexion/extension of the spine.  Lower ext neurologic  L5-S1 sensation intact bilaterally. Normal patellar reflexes bilaterally. No foot drop bilaterally.   Neurologic CN III- XII intact. No gross or motor sensory deficits.  Symmetric smile. No hand drink. Finger to nose intact.      Assessment & Plan:   Patient Instructions  1. Hypertension, unspecified type Bp well controlled. Refilled hctz 25 mg daily. - Comp Met (CMET) - Lipid panel  2. Cramp in muscle Cramps resolved.   3. Hyperlipidemia, unspecified hyperlipidemia type Follow labs and consider crestor since you report side effect to atorvastatatin. - Comp Met (CMET) - Lipid panel  4. Lumbar radiculopathy Continue to follow up with specialist.  5.  Vertigo Transient last night. But none when you got up this morning and normal neurologic exam. If you have recurrent vertigo let me know. If you get dizziness/vertigo with motor or sensory deficits be seen in the ED.   6. Fatigue, unspecified type - CBC w/Diff - Iron - B12   Follow up date to be determined. Typically would ask follow up to be in 3-6 months.     Esperanza Richters, PA-C

## 2023-04-13 NOTE — Patient Instructions (Signed)
1. Hypertension, unspecified type Bp well controlled. Refilled hctz 25 mg daily. - Comp Met (CMET) - Lipid panel  2. Cramp in muscle Cramps resolved.   3. Hyperlipidemia, unspecified hyperlipidemia type Follow labs and consider crestor since you report side effect to atorvastatatin. - Comp Met (CMET) - Lipid panel  4. Lumbar radiculopathy Continue to follow up with specialist.  5. Vertigo Transient last night. But none when you got up this morning and normal neurologic exam. If you have recurrent vertigo let me know. If you get dizziness/vertigo with motor or sensory deficits be seen in the ED.   6. Fatigue, unspecified type - CBC w/Diff - Iron - B12   Follow up date to be determined. Typically would ask follow up to be in 3-6 months.

## 2023-04-14 ENCOUNTER — Other Ambulatory Visit (HOSPITAL_BASED_OUTPATIENT_CLINIC_OR_DEPARTMENT_OTHER): Payer: Self-pay

## 2023-04-19 ENCOUNTER — Ambulatory Visit: Payer: Medicare (Managed Care) | Admitting: Family Medicine

## 2023-04-20 ENCOUNTER — Other Ambulatory Visit (HOSPITAL_COMMUNITY): Payer: Self-pay

## 2023-04-20 ENCOUNTER — Other Ambulatory Visit (HOSPITAL_BASED_OUTPATIENT_CLINIC_OR_DEPARTMENT_OTHER): Payer: Self-pay

## 2023-04-25 ENCOUNTER — Other Ambulatory Visit (HOSPITAL_BASED_OUTPATIENT_CLINIC_OR_DEPARTMENT_OTHER): Payer: Self-pay

## 2023-04-25 DIAGNOSIS — M5416 Radiculopathy, lumbar region: Secondary | ICD-10-CM | POA: Diagnosis not present

## 2023-04-25 DIAGNOSIS — M5441 Lumbago with sciatica, right side: Secondary | ICD-10-CM | POA: Diagnosis not present

## 2023-04-25 DIAGNOSIS — G8929 Other chronic pain: Secondary | ICD-10-CM | POA: Diagnosis not present

## 2023-04-25 MED ORDER — PREGABALIN 75 MG PO CAPS
75.0000 mg | ORAL_CAPSULE | Freq: Two times a day (BID) | ORAL | 2 refills | Status: DC
  Filled 2023-04-25: qty 60, 30d supply, fill #0

## 2023-05-16 DIAGNOSIS — M5416 Radiculopathy, lumbar region: Secondary | ICD-10-CM | POA: Diagnosis not present

## 2023-06-13 DIAGNOSIS — M5416 Radiculopathy, lumbar region: Secondary | ICD-10-CM | POA: Diagnosis not present

## 2023-06-13 DIAGNOSIS — G8929 Other chronic pain: Secondary | ICD-10-CM | POA: Diagnosis not present

## 2023-06-13 DIAGNOSIS — M5441 Lumbago with sciatica, right side: Secondary | ICD-10-CM | POA: Diagnosis not present

## 2023-07-18 ENCOUNTER — Ambulatory Visit (INDEPENDENT_AMBULATORY_CARE_PROVIDER_SITE_OTHER): Payer: Medicare (Managed Care) | Admitting: Medical

## 2023-07-18 ENCOUNTER — Encounter: Payer: Self-pay | Admitting: Medical

## 2023-07-18 VITALS — BP 130/70 | HR 98 | Temp 98.0°F | Resp 18 | Ht 67.0 in | Wt 162.2 lb

## 2023-07-18 DIAGNOSIS — E538 Deficiency of other specified B group vitamins: Secondary | ICD-10-CM | POA: Diagnosis not present

## 2023-07-18 DIAGNOSIS — R5383 Other fatigue: Secondary | ICD-10-CM | POA: Diagnosis not present

## 2023-07-18 DIAGNOSIS — E785 Hyperlipidemia, unspecified: Secondary | ICD-10-CM

## 2023-07-18 DIAGNOSIS — I10 Essential (primary) hypertension: Secondary | ICD-10-CM | POA: Diagnosis not present

## 2023-07-18 DIAGNOSIS — M25551 Pain in right hip: Secondary | ICD-10-CM

## 2023-07-18 MED ORDER — CYANOCOBALAMIN 1000 MCG/ML IJ SOLN
1000.0000 ug | Freq: Once | INTRAMUSCULAR | Status: AC
Start: 2023-07-18 — End: 2023-07-18
  Administered 2023-07-18: 1000 ug via INTRAMUSCULAR

## 2023-07-18 NOTE — Progress Notes (Signed)
   Subjective:    Patient ID: Phillip Hughes, male    DOB: Mar 03, 1956, 67 y.o.   MRN: [id number]  HPI  Pt states feels fine today. Only states some mild fatigue. Pt has low b12.  Has high cholesterol. Pt states crestor made his joints ache. Other med/station caused fatigue. He stopped crestor about 3 weeks. He is on red yeast rice and states no side effects.  Htn- bp controlled on recheck.on hydrochlorothiazide 12.5 mg daily.  Rt hip pain recently. Pain came on after epidural for back pain. Back better but now rt hip pain.  Fatigue with known low b12. Pt states he will get b12 injection today and go thru injection series.     Review of Systems See pi    Objective:   Physical Exam  General Mental Status- Alert. General Appearance- Not in acute distress.   Skin General: Color- Normal Color. Moisture- Normal Moisture.  Neck Carotid Arteries- Normal color. Moisture- Normal Moisture. No carotid bruits. No JVD.  Chest and Lung Exam Auscultation: Breath Sounds:-Normal.  Cardiovascular Auscultation:Rythm- Regular. Murmurs & Other Heart Sounds:Auscultation of the heart reveals- No Murmurs.  Abdomen Inspection:-Inspeection Normal. Palpation/Percussion:Note:No mass. Palpation and Percussion of the abdomen reveal- Non Tender, Non Distended + BS, no rebound or guarding.   Neurologic Cranial Nerve exam:- CN III-XII intact(No nystagmus), symmetric smile. Strength:- 5/5 equal and symmetric strength both upper and lower extremities.        Assessment & Plan:   Patient Instructions  1. Hyperlipidemia, unspecified hyperlipidemia type On review think best to reduce crestor 10 mg Monday, wed and Friday rather than red yeast rice. Hopefully can tolerate three day a week dosing without side effects. Ask to let me know how doing with treatment in 2 weeks. If side effect then could consider non statin type med called zetia.  2. Fatigue, unspecified type With b12 less than 200 think  best to start b12 injections and assess response.  3. Hypertension, unspecified type Bp well controlled. Continue hydrochlorothiazide.   4. Pain of right hip Get xray today or tomorrow. Then advise on treatment - DG HIP UNILAT WITH PELVIS 2-3 VIEWS RIGHT; Future  5. B12 deficiency - cyanocobalamin (VITAMIN B12) injection 1,000 mcg  - weekly for 4 weeks then montlhy for 5 months.  Follow up 2 month in office appt with myself  or sooner if needed.   Esperanza Richters, PA-C

## 2023-07-18 NOTE — Patient Instructions (Addendum)
1. Hyperlipidemia, unspecified hyperlipidemia type On review think best to reduce crestor 10 mg Monday, wed and Friday rather than red yeast rice. Hopefully can tolerate three day a week dosing without side effects. Ask to let me know how doing with treatment in 2 weeks. If side effect then could consider non statin type med called zetia.  2. Fatigue, unspecified type With b12 less than 200 think best to start b12 injections and assess response.  3. Hypertension, unspecified type Bp well controlled. Continue hydrochlorothiazide.   4. Pain of right hip Get xray today or tomorrow. Then advise on treatment - DG HIP UNILAT WITH PELVIS 2-3 VIEWS RIGHT; Future  5. B12 deficiency - cyanocobalamin (VITAMIN B12) injection 1,000 mcg  - weekly for 4 weeks then montlhy for 5 months.  Follow up 2 month in office appt with myself  or sooner if needed.

## 2023-07-19 ENCOUNTER — Ambulatory Visit (HOSPITAL_BASED_OUTPATIENT_CLINIC_OR_DEPARTMENT_OTHER)
Admission: RE | Admit: 2023-07-19 | Discharge: 2023-07-19 | Disposition: A | Discharge status: Home / Self Care | Payer: Medicare (Managed Care) | Source: Ambulatory Visit | Attending: Medical | Admitting: Medical

## 2023-07-19 DIAGNOSIS — M25551 Pain in right hip: Secondary | ICD-10-CM | POA: Diagnosis not present

## 2023-07-19 DIAGNOSIS — M47816 Spondylosis without myelopathy or radiculopathy, lumbar region: Secondary | ICD-10-CM | POA: Diagnosis not present

## 2023-07-21 ENCOUNTER — Other Ambulatory Visit (HOSPITAL_BASED_OUTPATIENT_CLINIC_OR_DEPARTMENT_OTHER): Payer: Self-pay

## 2023-07-24 NOTE — Addendum Note (Signed)
Addended by: Gwenevere Abbot on: 07/24/2023 08:05 PM   Modules accepted: Orders

## 2023-07-24 NOTE — Addendum Note (Signed)
Addended by: Gwenevere Abbot on: 07/24/2023 09:13 PM   Modules accepted: Orders

## 2023-07-25 ENCOUNTER — Telehealth: Payer: Self-pay | Admitting: Medical

## 2023-07-25 ENCOUNTER — Other Ambulatory Visit (INDEPENDENT_AMBULATORY_CARE_PROVIDER_SITE_OTHER): Payer: Medicare (Managed Care)

## 2023-07-25 ENCOUNTER — Ambulatory Visit (INDEPENDENT_AMBULATORY_CARE_PROVIDER_SITE_OTHER): Payer: Medicare (Managed Care)

## 2023-07-25 DIAGNOSIS — E538 Deficiency of other specified B group vitamins: Secondary | ICD-10-CM

## 2023-07-25 DIAGNOSIS — M25551 Pain in right hip: Secondary | ICD-10-CM

## 2023-07-25 LAB — CBC WITH DIFFERENTIAL/PLATELET
Basophils Absolute: 0 10*3/uL (ref 0.0–0.1)
Basophils Relative: 0.5 % (ref 0.0–3.0)
Eosinophils Absolute: 0.1 10*3/uL (ref 0.0–0.7)
Eosinophils Relative: 1.4 % (ref 0.0–5.0)
HCT: 40.1 % (ref 39.0–52.0)
Hemoglobin: 13 g/dL (ref 13.0–17.0)
Lymphocytes Relative: 51.6 % — ABNORMAL HIGH (ref 12.0–46.0)
Lymphs Abs: 2.7 10*3/uL (ref 0.7–4.0)
MCHC: 32.4 g/dL (ref 30.0–36.0)
MCV: 87.6 fl (ref 78.0–100.0)
Monocytes Absolute: 0.7 10*3/uL (ref 0.1–1.0)
Monocytes Relative: 12.8 % — ABNORMAL HIGH (ref 3.0–12.0)
Neutro Abs: 1.8 10*3/uL (ref 1.4–7.7)
Neutrophils Relative %: 33.7 % — ABNORMAL LOW (ref 43.0–77.0)
Platelets: 272 10*3/uL (ref 150.0–400.0)
RBC: 4.58 Mil/uL (ref 4.22–5.81)
RDW: 12.7 % (ref 11.5–15.5)
WBC: 5.2 10*3/uL (ref 4.0–10.5)

## 2023-07-25 LAB — C-REACTIVE PROTEIN: CRP: <1 mg/dL (ref 0.5–20.0)

## 2023-07-25 LAB — SEDIMENTATION RATE: Sed Rate: 55 mm/hr — ABNORMAL HIGH (ref 0–20)

## 2023-07-25 MED ORDER — CYANOCOBALAMIN 1000 MCG/ML IJ SOLN
1000.0000 ug | Freq: Once | INTRAMUSCULAR | Status: AC
Start: 2023-07-25 — End: 2023-07-25
  Administered 2023-07-25: 1000 ug via INTRAMUSCULAR

## 2023-07-25 NOTE — Telephone Encounter (Signed)
Phillip Hughes from [facility] called to inform [name] that the patient's hip procedure results are now available for review.

## 2023-07-25 NOTE — Telephone Encounter (Signed)
Pt aware.

## 2023-07-25 NOTE — Progress Notes (Signed)
Pt here for weekly B12 injection per Edward Saguier,PA-C  B12 1000mcg given IM, and pt tolerated injection well.  Next B12 injection scheduled for next week 

## 2023-07-26 ENCOUNTER — Other Ambulatory Visit: Payer: Medicare (Managed Care)

## 2023-07-27 ENCOUNTER — Telehealth: Payer: Self-pay | Admitting: Medical

## 2023-07-27 NOTE — Telephone Encounter (Signed)
Pt called stating that he had reached out to the ortho practice he was referred to and they did not have a referral on hand for him. Pt would like to have it resent when possible. Routed HP due to urgent priority in referral.

## 2023-08-02 ENCOUNTER — Ambulatory Visit (INDEPENDENT_AMBULATORY_CARE_PROVIDER_SITE_OTHER): Payer: Medicare (Managed Care)

## 2023-08-02 DIAGNOSIS — E538 Deficiency of other specified B group vitamins: Secondary | ICD-10-CM | POA: Diagnosis not present

## 2023-08-02 MED ORDER — CYANOCOBALAMIN 1000 MCG/ML IJ SOLN
1000.0000 ug | Freq: Once | INTRAMUSCULAR | Status: AC
Start: 2023-08-02 — End: 2023-08-02
  Administered 2023-08-02: 1000 ug via INTRAMUSCULAR

## 2023-08-02 NOTE — Progress Notes (Signed)
Pt here for weekly 3 of 4 B12 injection per [name],PA-C  B12 deficiency - cyanocobalamin (VITAMIN B12) injection 1,000 mcg - weekly for 4 weeks then montlhy for 5 months. B12 given IM, and pt tolerated injection well.  Next B12 injection scheduled for next week

## 2023-08-11 ENCOUNTER — Ambulatory Visit (INDEPENDENT_AMBULATORY_CARE_PROVIDER_SITE_OTHER): Payer: Medicare (Managed Care)

## 2023-08-11 DIAGNOSIS — E538 Deficiency of other specified B group vitamins: Secondary | ICD-10-CM

## 2023-08-11 MED ORDER — CYANOCOBALAMIN 1000 MCG/ML IJ SOLN
1000.0000 ug | Freq: Once | INTRAMUSCULAR | Status: AC
Start: 2023-08-11 — End: 2023-08-11
  Administered 2023-08-11: 1000 ug via INTRAMUSCULAR

## 2023-08-11 NOTE — Progress Notes (Signed)
Phillip Hughes is a 67 y.o. male presents to the office today for B12injections # 4 of 4 weekly, per physician's orders. Original order: Per [name] on lab results from 04/13/23. Cyanocobalamin (med), 1000 mg/ml (dose),  IM (route) was administered left arm (location) today. Patient tolerated injection.  Patient next injection due: in one month for montly  B12, appt made Yes 09/08/23.   [name]

## 2023-08-17 ENCOUNTER — Telehealth: Payer: Self-pay | Admitting: Medical

## 2023-08-17 DIAGNOSIS — M25451 Effusion, right hip: Secondary | ICD-10-CM | POA: Diagnosis not present

## 2023-08-17 DIAGNOSIS — Z96641 Presence of right artificial hip joint: Secondary | ICD-10-CM | POA: Diagnosis not present

## 2023-08-17 DIAGNOSIS — M25551 Pain in right hip: Secondary | ICD-10-CM | POA: Diagnosis not present

## 2023-08-17 DIAGNOSIS — G8929 Other chronic pain: Secondary | ICD-10-CM | POA: Diagnosis not present

## 2023-08-17 NOTE — Telephone Encounter (Signed)
Pt wanted clarification on the frequency of his b12 shots. Pt thought it would be once per week for 5 weeks and then once a month. Pt is currently scheduled for one 08/18/23 and then again on 09/08/23. Please advise pt

## 2023-08-17 NOTE — Telephone Encounter (Signed)
After injection tomorrow pt is done with weekly b12's could you make pt aware on tomorrow at his [place] , I will cancel the 09/08/23 visit

## 2023-08-17 NOTE — Telephone Encounter (Signed)
Looked in chart , canceling appt for tomorrow its too early for monthly b12 , pt should keep appt for 09/08/23  Pt called and unable to reach pt , phone rang and no voicemail set up

## 2023-08-17 NOTE — Telephone Encounter (Signed)
Pt called back and advised he did not get a call back and wanted to follow up. Advised that someone tried to call to which pt interrupted and said he did not get a call and has had his phone on him all day. Advised that the appt for tomorrow was cancelled and he should keep the one on 9/26.

## 2023-08-18 ENCOUNTER — Ambulatory Visit: Payer: Medicare (Managed Care)

## 2023-09-02 ENCOUNTER — Telehealth: Payer: Self-pay | Admitting: Medical

## 2023-09-02 NOTE — Telephone Encounter (Signed)
Pt called requesting something to be called in regarding his swelling and inflammation of his right leg regarding his sciatica. Pt stated he is taking celebrex and it is not helping with this issue.

## 2023-09-03 IMAGING — DX DG HIP (WITH OR WITHOUT PELVIS) 2-3V*R*
3 series · 3 of 3 positions shown · non-contrast
Comparison: 06/06/2019

CLINICAL DATA: Right hip pain.  Previous right hip arthroplasty.

EXAM:
DG HIP (WITH OR WITHOUT PELVIS) 2-3V RIGHT

[pelvis ap]
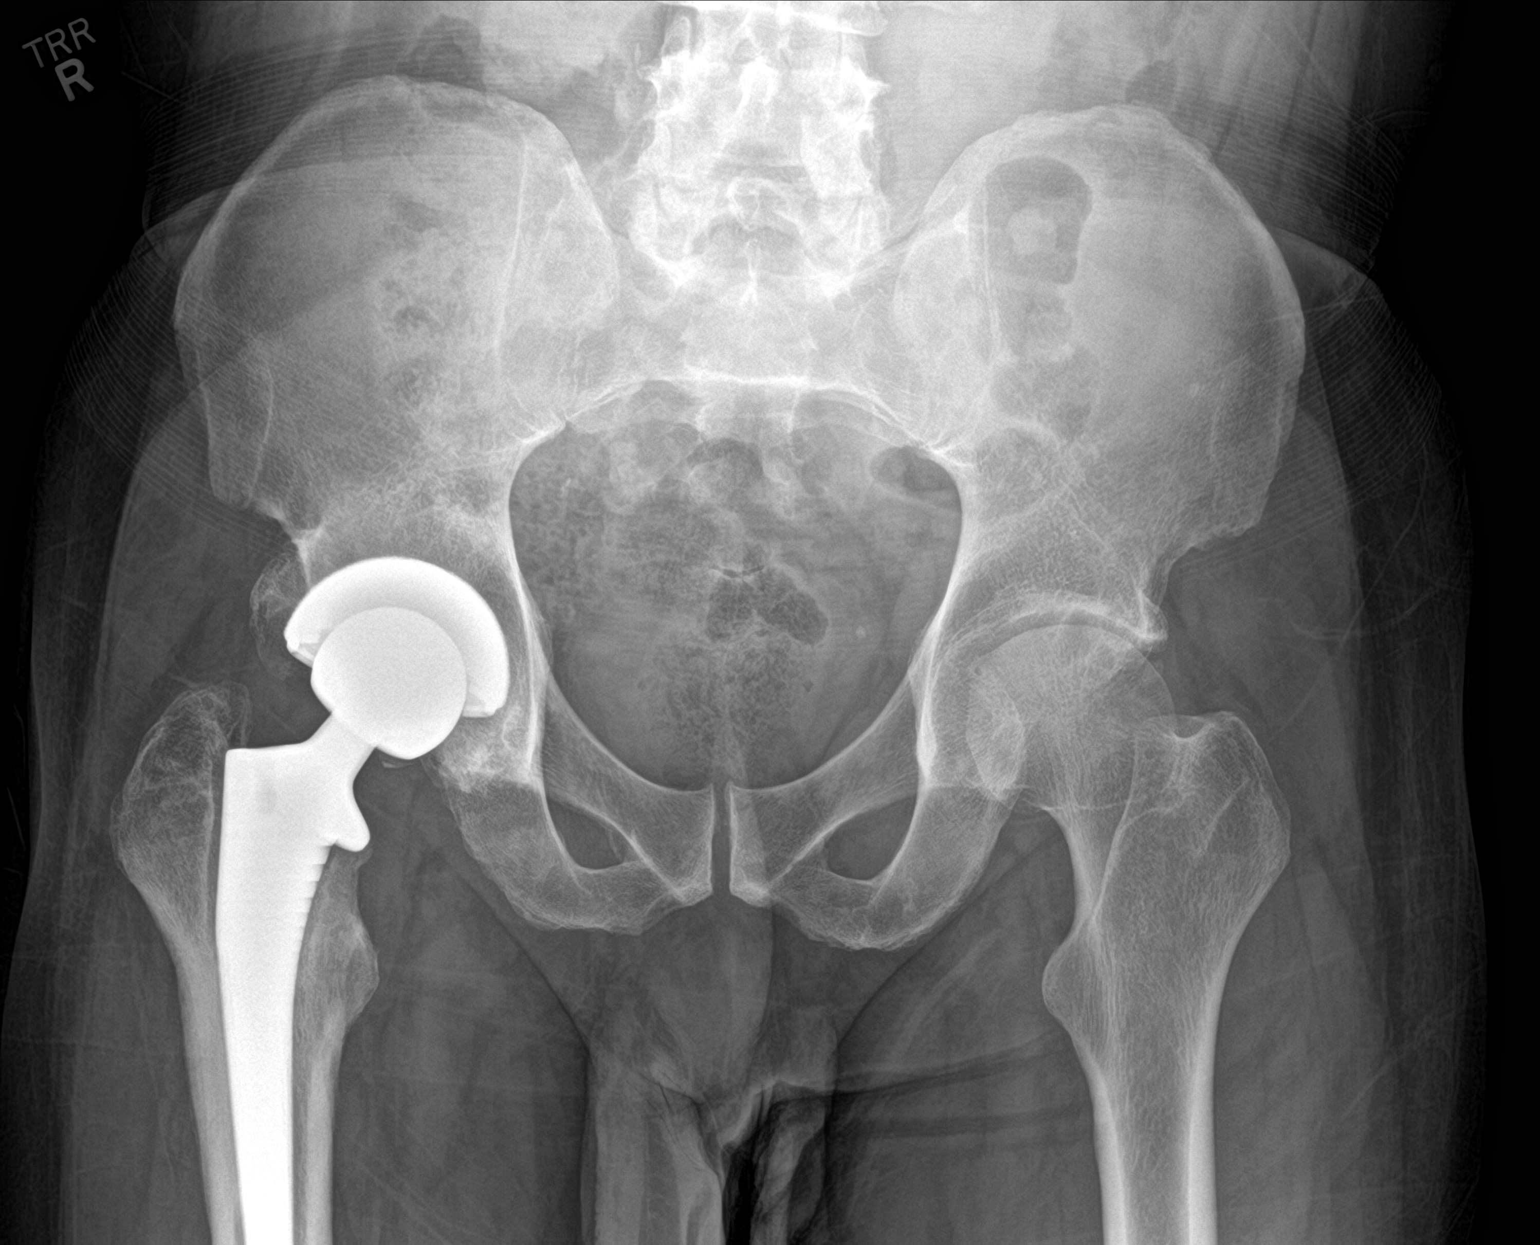

[hip ap]
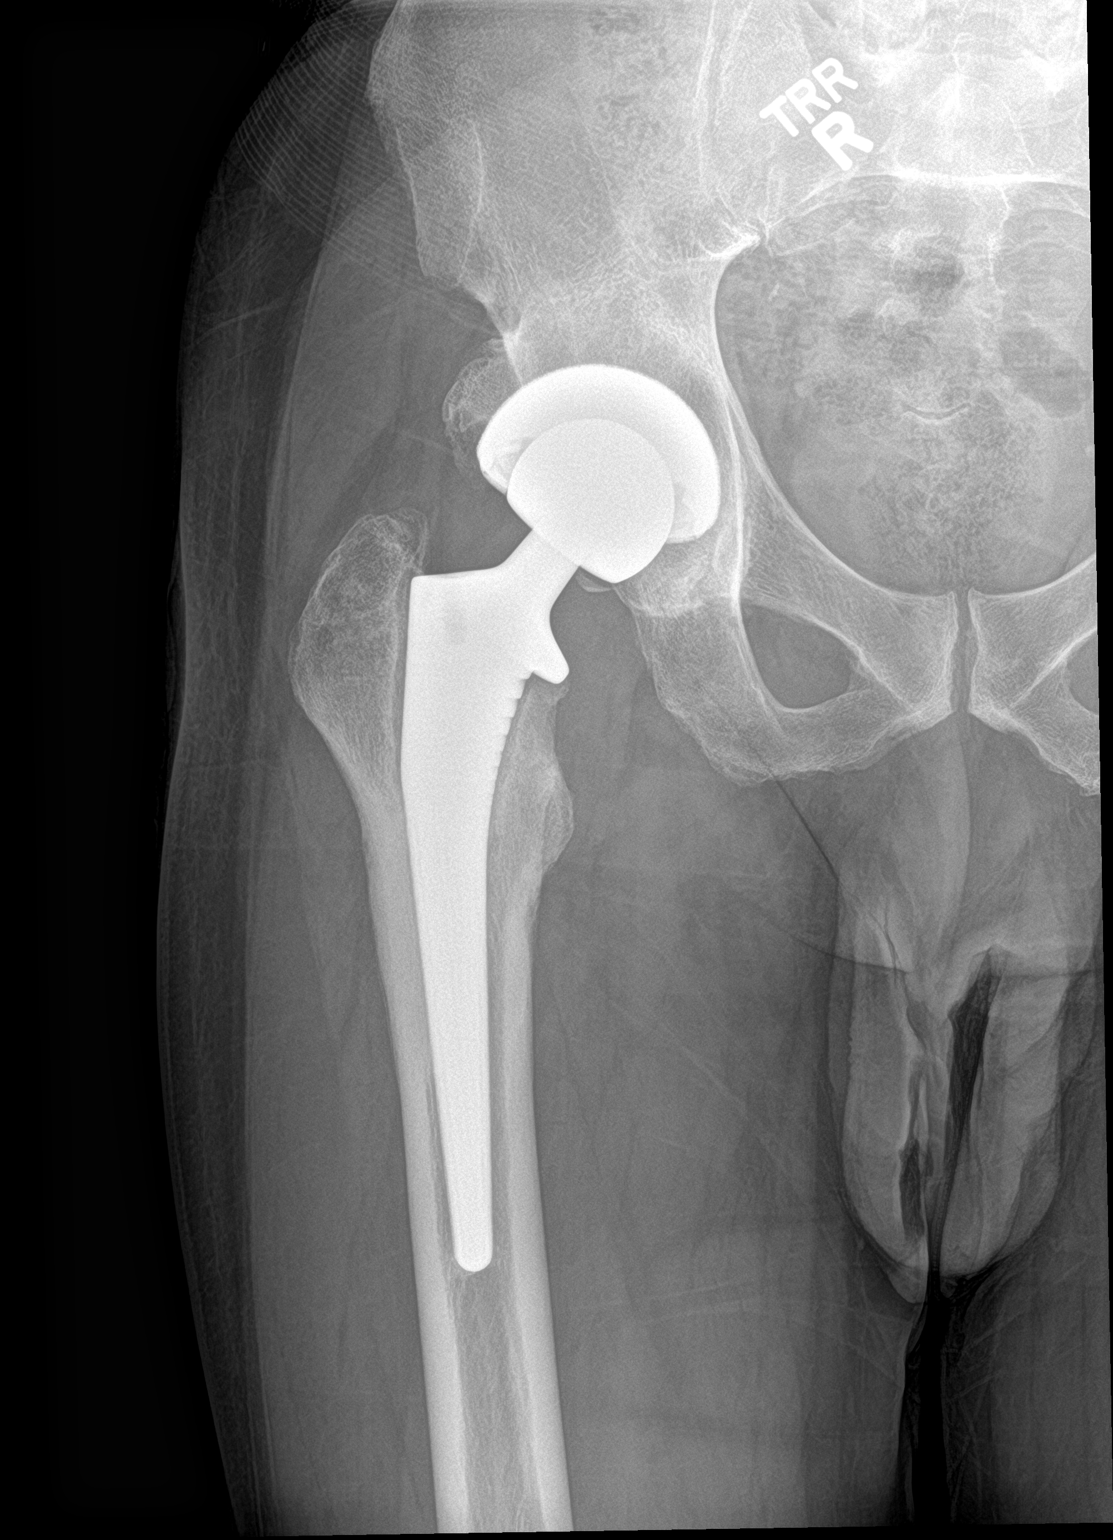

[hip lat]
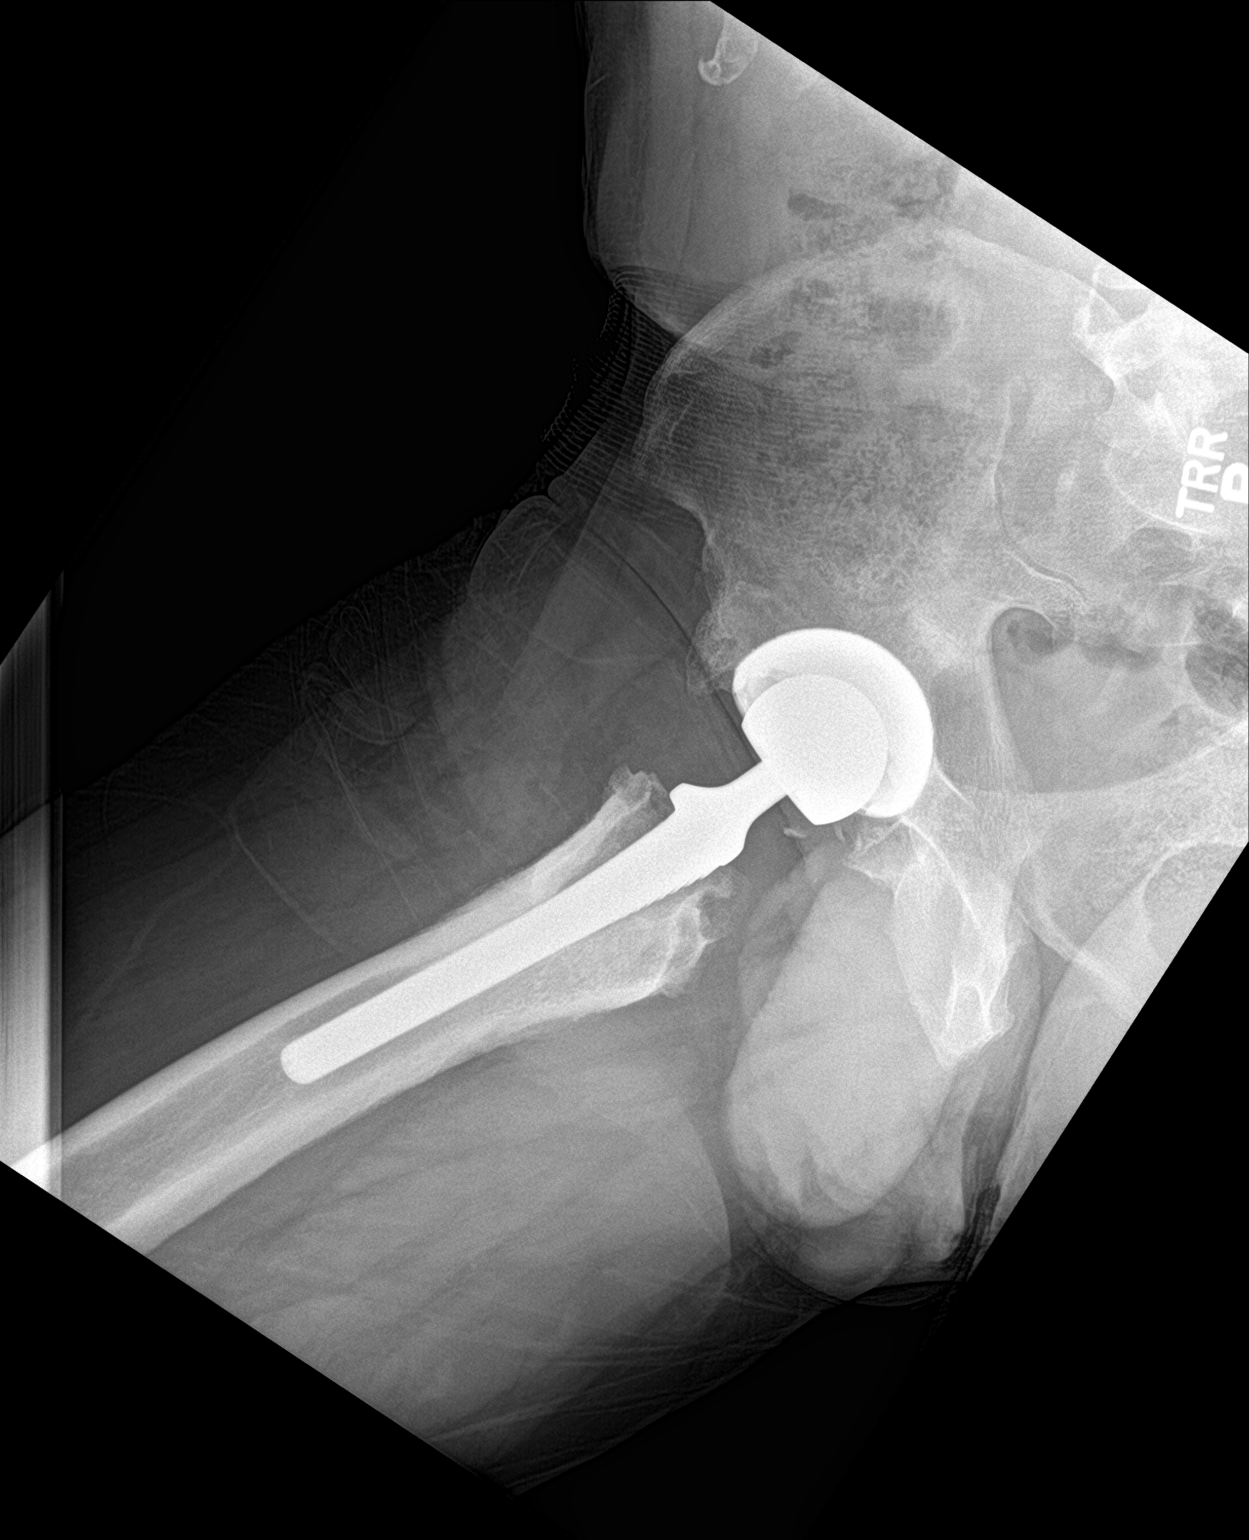

[3 of 3 positions shown; findings below may reference images not displayed]

FINDINGS: Bipolar hip prosthesis remains in appropriate position. No evidence
of fracture or dislocation. No other bone lesions identified.
IMPRESSION: Unremarkable appearance of right hip prosthesis. No acute findings.

## 2023-09-05 NOTE — Telephone Encounter (Signed)
Pt called and lvm to return call to schedule an appt

## 2023-09-07 ENCOUNTER — Ambulatory Visit: Payer: Medicare (Managed Care) | Admitting: Medical

## 2023-09-07 ENCOUNTER — Other Ambulatory Visit (HOSPITAL_BASED_OUTPATIENT_CLINIC_OR_DEPARTMENT_OTHER): Payer: Self-pay

## 2023-09-07 ENCOUNTER — Other Ambulatory Visit: Payer: Self-pay

## 2023-09-07 ENCOUNTER — Encounter: Payer: Self-pay | Admitting: Medical

## 2023-09-07 VITALS — BP 130/88 | HR 96 | Temp 98.4°F | Resp 18 | Ht 67.0 in | Wt 165.2 lb

## 2023-09-07 DIAGNOSIS — M5416 Radiculopathy, lumbar region: Secondary | ICD-10-CM

## 2023-09-07 DIAGNOSIS — R1031 Right lower quadrant pain: Secondary | ICD-10-CM

## 2023-09-07 DIAGNOSIS — G8929 Other chronic pain: Secondary | ICD-10-CM | POA: Diagnosis not present

## 2023-09-07 DIAGNOSIS — E538 Deficiency of other specified B group vitamins: Secondary | ICD-10-CM

## 2023-09-07 DIAGNOSIS — M79651 Pain in right thigh: Secondary | ICD-10-CM

## 2023-09-07 MED ORDER — PREGABALIN 75 MG PO CAPS
75.0000 mg | ORAL_CAPSULE | Freq: Two times a day (BID) | ORAL | 0 refills | Status: DC
  Filled 2023-09-07: qty 60, 30d supply, fill #0

## 2023-09-07 MED ORDER — CYANOCOBALAMIN 1000 MCG/ML IJ SOLN
1000.0000 ug | Freq: Once | INTRAMUSCULAR | Status: AC
Start: 2023-09-07 — End: 2023-09-07
  Administered 2023-09-07: 1000 ug via INTRAMUSCULAR

## 2023-09-07 NOTE — Progress Notes (Signed)
Subjective:    Patient ID: Phillip Hughes, male    DOB: 11-Sep-1956, 68 y.o.   MRN: [id number]  HPI  Pt in with rt upper mid to  outer  upper thigh and groin pain.   Pt has been seeing specialist who did studies of his his rt hip. He states fluid analysis of his hip did not show infection.  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   ANAEROBIC CULTURE        Studies came back negative.   Xray of hip showed.  IMPRESSION: 1. Interval development of lucency surrounding the femoral component of the right hip arthroplasty, with new cortical thickening and periosteal reaction of the proximal right femoral diaphysis. The appearance is concerning for aseptic loosening or infection, in spite of the normal 3 phase bone scan performed 03/16/2023. 2. 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 3. No acute fracture.  Pt states no longer has knee pain.  Looks like march of this year Nuclear medicine bone scan was ordered.  IMPRESSION:  No evidence of loosening or  infection of the RIGHT hip prosthetic.    Pt is convinced that his back is causing the back. Back surgery feb 2017. Mri back 04-26-2022  . T12-L1: No disc herniation, significant central canal or foraminal stenosis.  Marland Kitchen L1-L2: No disc herniation, significant central canal or foraminal stenosis.  Marland Kitchen L2-L3: Severe disc space narrowing and chronic Modic 3 endplate changes with fatty metaplasia and numerous Schmorl's nodes. Right foraminal disc osteophyte complex with severe right foraminal stenosis. Moderate central canal and left foraminal  stenosis.  Marland Kitchen L3-L4: Small left foraminal disc osteophyte complex. Mild facet arthrosis and ligamentum flavum thickening. Minimal central canal stenosis. Moderate left foraminal stenosis.  Marland Kitchen L4-L5: Possible previous decompressive changes at this level. Moderate facet arthrosis and ligamentum flavum thickening. Moderate-sized broad-based disc protrusion. Moderate bilateral subarticular recess and foraminal stenosis.  Marland Kitchen L5-S1: Moderate facet arthrosis and ligamentum flavum thickening. Small facet joint effusions. Slight degenerative anterolisthesis. Small broad-based disc osteophyte complex. Mild to moderate central canal and bilateral foraminal stenosis.   Pt told by specialist he would benefit from back surgery.  His ortho that did hip surgery think back is source of pain. He does have low back pain but upper thigh and groin is worse.   Pt has pain despite 3 epidural.  On review pt was given lyrica 75 mg twice daily. But he does  not remember using in May. He may have refills.   Review of Systems See hpi. No cauda equina signs/symptoms reported.       Objective:   Physical Exam  General Mental Status- Alert. General Appearance- Not in acute distress.   Skin General: Color- Normal Color. Moisture- Normal Moisture.  Neck Carotid Arteries- Normal color. Moisture- Normal Moisture. No carotid bruits. No JVD.  Chest and Lung Exam Auscultation: Breath Sounds:-Normal.  Cardiovascular Auscultation:Rythm- Regular. Murmurs & Other Heart Sounds:Auscultation of the heart reveals- No Murmurs.  Abdomen Inspection:-Inspeection Normal. Palpation/Percussion:Note:No mass. Palpation and Percussion of the abdomen reveal- Non Tender, Non Distended + BS, no rebound or guarding.   Neurologic Cranial Nerve exam:-  CN III-XII intact(No nystagmus), symmetric smile. Strength:- 5/5 equal and symmetric strength both upper and lower extremities.       Assessment & Plan:   Patient Instructions   Lumbar radiculopathy  with Groin pain and right thigh  History of recent extensive workup of right hip imaging studies, joint culture and recent MRI of the back.  After workup it appears that the lumbar spine might be the source of the groin and the thigh pain.  Back specialist recommended back surgery but understandably you are hesitant to do surgery again.  Also 3 epidural injections have not resolved of the pain.  -On review you did have a prescription of Lyrica that you used in May but appears you did not get further refills.  Sent new prescription downstairs to our pharmacy.  Want you to try Lyrica first to see if this resolves your pain.  If after 7 to 10 days you feel not decreasing your pain then would consider taper prednisone.  I will see how you respond to above treatment.  Consider referral to our sports medicine as well.  I might be able to help with nonsurgical options for treatment/pain control.  Follow-up with me in 2 weeks or sooner if needed.      On further discussion he thinks does have lyrica. But has not been using. He thinks did not help much at 75 mg twice a day. Some time since used so advised to use 75 mg twice daily for one week then advance to 2 tab twice daily.   Esperanza Richters, PA-C

## 2023-09-07 NOTE — Addendum Note (Signed)
Addended by: Kathi Ludwig on: 09/07/2023 05:00 PM   Modules accepted: Orders

## 2023-09-07 NOTE — Patient Instructions (Addendum)
Lumbar radiculopathy  with Groin pain and right thigh  History of recent extensive workup of right hip imaging studies, joint culture and recent MRI of the back.  After workup it appears that the lumbar spine might be the source of the groin and the thigh pain.  Back specialist recommended back surgery but understandably you are hesitant to do surgery again.  Also 3 epidural injections have not resolved of the pain.  -On review you did have a prescription of Lyrica that you used in May but appears you did not get further refills.  Sent new prescription downstairs to our pharmacy.  Want you to try Lyrica first to see if this resolves your pain.  If after 7 to 10 days you feel not decreasing your pain then would consider taper prednisone.  I will see how you respond to above treatment.  Consider referral to our sports medicine as well.  I might be able to help with nonsurgical options for treatment/pain control.  Follow-up with me in 2 weeks or sooner if needed.  On further discussion he thinks does have lyrica. But has not been using. He thinks did not help much at 75 mg twice a day. Some time since used so advised to use 75 mg twice daily for one week then advance to 2 tab twice daily.

## 2023-09-08 ENCOUNTER — Ambulatory Visit: Payer: Medicare (Managed Care)

## 2023-10-06 ENCOUNTER — Ambulatory Visit: Payer: Medicare (Managed Care)

## 2023-10-06 ENCOUNTER — Other Ambulatory Visit (HOSPITAL_BASED_OUTPATIENT_CLINIC_OR_DEPARTMENT_OTHER): Payer: Self-pay

## 2023-10-06 ENCOUNTER — Ambulatory Visit (INDEPENDENT_AMBULATORY_CARE_PROVIDER_SITE_OTHER): Payer: Medicare (Managed Care) | Admitting: Medical

## 2023-10-06 VITALS — BP 135/80 | HR 95 | Temp 98.7°F | Resp 16 | Ht 67.0 in | Wt 166.0 lb

## 2023-10-06 DIAGNOSIS — E538 Deficiency of other specified B group vitamins: Secondary | ICD-10-CM | POA: Diagnosis not present

## 2023-10-06 DIAGNOSIS — M25551 Pain in right hip: Secondary | ICD-10-CM | POA: Diagnosis not present

## 2023-10-06 DIAGNOSIS — M5416 Radiculopathy, lumbar region: Secondary | ICD-10-CM

## 2023-10-06 DIAGNOSIS — Z79899 Other long term (current) drug therapy: Secondary | ICD-10-CM | POA: Diagnosis not present

## 2023-10-06 LAB — CBC WITH DIFFERENTIAL/PLATELET
Basophils Absolute: 0 10*3/uL (ref 0.0–0.1)
Basophils Relative: 0.8 % (ref 0.0–3.0)
Eosinophils Absolute: 0.2 10*3/uL (ref 0.0–0.7)
Eosinophils Relative: 3.8 % (ref 0.0–5.0)
HCT: 40.6 % (ref 39.0–52.0)
Hemoglobin: 12.9 g/dL — ABNORMAL LOW (ref 13.0–17.0)
Lymphocytes Relative: 47.7 % — ABNORMAL HIGH (ref 12.0–46.0)
Lymphs Abs: 2.2 10*3/uL (ref 0.7–4.0)
MCHC: 31.8 g/dL (ref 30.0–36.0)
MCV: 89.4 fL (ref 78.0–100.0)
Monocytes Absolute: 0.5 10*3/uL (ref 0.1–1.0)
Monocytes Relative: 11.5 % (ref 3.0–12.0)
Neutro Abs: 1.7 10*3/uL (ref 1.4–7.7)
Neutrophils Relative %: 36.2 % — ABNORMAL LOW (ref 43.0–77.0)
Platelets: 271 10*3/uL (ref 150.0–400.0)
RBC: 4.54 Mil/uL (ref 4.22–5.81)
RDW: 13.2 % (ref 11.5–15.5)
WBC: 4.7 10*3/uL (ref 4.0–10.5)

## 2023-10-06 LAB — SEDIMENTATION RATE: Sed Rate: 47 mm/h — ABNORMAL HIGH (ref 0–20)

## 2023-10-06 MED ORDER — CYANOCOBALAMIN 1000 MCG/ML IJ SOLN
1000.0000 ug | Freq: Once | INTRAMUSCULAR | Status: AC
Start: 2023-10-06 — End: 2023-10-06
  Administered 2023-10-06: 1000 ug via INTRAMUSCULAR

## 2023-10-06 MED ORDER — CELECOXIB 100 MG PO CAPS
100.0000 mg | ORAL_CAPSULE | Freq: Two times a day (BID) | ORAL | 1 refills | Status: DC
  Filled 2023-10-06: qty 60, 30d supply, fill #0
  Filled 2023-11-03: qty 60, 30d supply, fill #1

## 2023-10-06 MED ORDER — PREGABALIN 200 MG PO CAPS
200.0000 mg | ORAL_CAPSULE | Freq: Two times a day (BID) | ORAL | 3 refills | Status: DC
  Filled 2023-10-06: qty 60, 30d supply, fill #0
  Filled 2023-11-03: qty 60, 30d supply, fill #1
  Filled 2024-04-16: qty 60, 30d supply, fill #2

## 2023-10-06 NOTE — Progress Notes (Signed)
Subjective:    Patient ID: Darth Crismon, male    DOB: 1956-08-31, 67 y.o.   MRN: [id number]  HPI  Pt in for follow up.  Last AVS below in ".   "Lumbar radiculopathy  with Groin pain and right thigh   History of recent extensive workup of right hip imaging studies, joint culture and recent MRI of the back.   After workup it appears that the lumbar spine might be the source of the groin and the thigh pain.  Back specialist recommended back surgery but understandably you are hesitant to do surgery again.  Also 3 epidural injections have not resolved of the pain.   -On review you did have a prescription of Lyrica that you used in May but appears you did not get further refills.  Sent new prescription downstairs to our pharmacy.  Want you to try Lyrica first to see if this resolves your pain.  If after 7 to 10 days you feel not decreasing your pain then would consider taper prednisone.   I will see how you respond to above treatment.  Consider referral to our sports medicine as well.  I might be able to help with nonsurgical options for treatment/pain control.   Follow-up with me in 2 weeks or sooner if needed.        On further discussion he thinks does have lyrica. But has not been using. He thinks did not help much at 75 mg twice a day. Some time since used so advised to use 75 mg twice daily for one week then advance to 2 tab twice daily."  Pt states he ran out of 75 mg twice daily. When he ran out he took 100 mg. He took 2 tab twice daily.   He states he can walk not comfortably after taking higher dose of med.   He wants to add on nsaid. Good gfr, good bp and no stomach ulcer or gerd.   He is reluctant to do surgery again as surgery.  He did not schedule at 2 week follow up as had asked.  Pt state level of pain is about 1/10 with lyrica 200 mg twice daily. On side effects reported.    Pt still has rt hip and groin. In past referred back to his orthopedist for hip.   3  weeks of rt hip pain. Below is radiologist impression. Mentioned aseptic loosening vs infection. Labs being done to evaluate for infection. Refer back to orthopedist. I believe below is prior ortho who did surgery in 2016. Refer back to that surgeon or other. If nothing at that location then make internal referral.   Leanne Lovely, MD [address] [facility] [place], [place] [address] Phone: [phone] Fax: [phone]  Pt did see speciaist.  Plan:  Discussed treatment options. Recommended fluoroscopic aspiration of the right hip joint to be sent for cultures, Gram stain and cell count. I will see him back after results are available. Continue with current management in the meantime. He will call for other signs of infection that I went over with him today.   Pt tells me he was told to infection. No fevers, no chills or sweats.   Review of Systems  Constitutional:  Negative for chills, fatigue and fever.  Respiratory:  Negative for cough, chest tightness and wheezing.   Cardiovascular:  Negative for chest pain and palpitations.  Gastrointestinal:  Negative for abdominal pain.  Musculoskeletal:  Positive for back pain.  Rt hip pain  Skin:  Negative for rash.  Neurological:  Negative for dizziness, seizures and light-headedness.  Hematological:  Negative for adenopathy. Does not bruise/bleed easily.  Psychiatric/Behavioral:  Negative for behavioral problems and decreased concentration.    Past Medical History:  Diagnosis Date   Hypertension    Sciatica    Sinusitis      Social History   Socioeconomic History   Marital status: Married    Spouse name: Not on file   Number of children: Not on file   Years of education: Not on file   Highest education level: 11th grade  Occupational History   Not on file  Tobacco Use   Smoking status: Former    Current packs/day: 1.00    Average packs/day: 1 pack/day for 28.0 years (28.0 ttl  pk-yrs)    Types: Cigarettes   Smokeless tobacco: Never  Vaping Use   Vaping status: Never Used  Substance and Sexual Activity   Alcohol use: Yes    Comment: rarely   Drug use: No   Sexual activity: Not on file  Other Topics Concern   Not on file  Social History Narrative   Not on file   Social Determinants of Health   Financial Resource Strain: Low Risk  (10/05/2023)   Overall Financial Resource Strain (CARDIA)    Difficulty of Paying Living Expenses: Not hard at all  Food Insecurity: No Food Insecurity (10/05/2023)   Hunger Vital Sign    Worried About Running Out of Food in the Last Year: Never true    Ran Out of Food in the Last Year: Never true  Transportation Needs: No Transportation Needs (10/05/2023)   PRAPARE - Administrator, Civil Service (Medical): No    Lack of Transportation (Non-Medical): No  Physical Activity: Insufficiently Active (10/05/2023)   Exercise Vital Sign    Days of Exercise per Week: 1 day    Minutes of Exercise per Session: 10 min  Stress: No Stress Concern Present (10/05/2023)   [employer] of Occupational Health - Occupational Stress Questionnaire    Feeling of Stress : Not at all  Social Connections: Moderately Isolated (10/05/2023)   Social Connection and Isolation Panel [NHANES]    Frequency of Communication with Friends and Family: More than three times a week    Frequency of Social Gatherings with Friends and Family: More than three times a week    Attends Religious Services: Never    Database administrator or Organizations: No    Attends Engineer, structural: Not on file    Marital Status: Married  Catering manager Violence: Not At Risk (10/25/2022)   Received from [employer], [facility]   HITS    Over the last 12 months how often did your partner physically hurt you?: 1    Over the last 12 months how often did your partner insult you or talk down to you?: 1    Over the last 12 months how often did  your partner threaten you with physical harm?: 1    Over the last 12 months how often did your partner scream or curse at you?: 1    Past Surgical History:  Procedure Laterality Date   BACK SURGERY     HERNIA REPAIR     HIP SURGERY     NECK SURGERY      No family history on file.  Allergies  Allergen Reactions   Gabapentin Other (See Comments)    Mood  Swings and Depression Mood Swings and Depression Mood Swings and Depression Mood Swings and Depression    Statins Other (See Comments)    Current Outpatient Medications on File Prior to Visit  Medication Sig Dispense Refill   atorvastatin (LIPITOR) 20 MG tablet Take 1 tablet by mouth daily.     celecoxib (CELEBREX) 200 MG capsule Take 1 capsule (200 mg total) by mouth 2 (two) times daily as needed. 60 capsule 2   diclofenac Sodium (VOLTAREN) 1 % GEL Apply 2 g topically 4 (four) times daily as needed. 100 g 0   hydrochlorothiazide (HYDRODIURIL) 25 MG tablet Take 1 tablet (25 mg total) by mouth daily. 90 tablet 3   oxyCODONE-acetaminophen (PERCOCET) 7.5-325 MG tablet Take 1 tablet by mouth every 8 (eight) hours as needed. 15 tablet 0   rosuvastatin (CRESTOR) 10 MG tablet Take 1 tablet (10 mg total) by mouth daily. 90 tablet 3   No current facility-administered medications on file prior to visit.    BP 135/80 (BP Location: Right Arm, Patient Position: Sitting, Cuff Size: Normal)   Pulse 95   Temp 98.7 F (37.1 C) (Oral)   Resp 16   Ht 5\' 7"  (1.702 m)   Wt 166 lb (75.3 kg)   SpO2 98%   BMI 26.00 kg/m        Objective:   Physical Exam   General Appearance- Not in acute distress.    Chest and Lung Exam Auscultation: Breath sounds:-Normal. Clear even and unlabored. Adventitious sounds:- No Adventitious sounds.  Cardiovascular Auscultation:Rythm - Regular, rate and rythm. Heart Sounds -Normal heart sounds.  Abdomen Inspection:-Inspection Normal.  Palpation/Perucssion: Palpation and Percussion of the abdomen  reveal- Non Tender, No Rebound tenderness, No rigidity(Guarding) and No Palpable abdominal masses.  Liver:-Normal.  Spleen:- Normal.   Back No Mid lumbar spine tenderness to palpation.  No Pain on straight leg lift. Pain on lateral movements and flexion/extension of the spine.  Lower ext neurologic  L5-S1 sensation intact bilaterally. Normal patellar reflexes bilaterally. No foot drop bilaterally.   Rt hip- no pain on direct palpation but rt hip is stiff on range of motion.     Assessment & Plan:   Patient Instructions  1. Pain of right hip -Celebrex 100 mg twice daily. Rx advisement given. On review your ortho told you no infection and no mention of loose equipment. Recommend you call your orthopedist and follow up with him within 2-3 weeks. Declined sport med referral and PT referral. If any infection signs/sympotms occur let me know. - CBC w/Diff - Sedimentation rate  2. Lumbar radiculopathy  -much better/almost 100% resolved pain with lyrica 200 mg twice daily dose. Rx today at that dose. Uds and contract today.(Not to increase dose. Not to borrow wife med explained)   Follow up in one month or sooner if needed.      Esperanza Richters, PA-C

## 2023-10-06 NOTE — Patient Instructions (Addendum)
1. Pain of right hip -Celebrex 100 mg twice daily. Rx advisement given. On review your ortho told you no infection and no mention of loose equipment. Recommend you call your orthopedist and follow up with him within 2-3 weeks. Declined sport med referral and PT referral. If any infection signs/sympotms occur let me know. - CBC w/Diff - Sedimentation rate  2. Lumbar radiculopathy  -much better/almost 100% resolved pain with lyrica 200 mg twice daily dose. Rx today at that dose. Uds and contract today.(Not to increase dose. Not to borrow wife med explained)  B12 deficieny- b12 injection today   Follow up in one month or sooner if needed.

## 2023-10-08 LAB — DRUG MONITORING PANEL 376104, URINE
Amphetamines: NEGATIVE ng/mL (ref <500)
Barbiturates: NEGATIVE ng/mL (ref <300)
Benzodiazepines: NEGATIVE ng/mL (ref <100)
Cocaine Metabolite: NEGATIVE ng/mL (ref <150)
Desmethyltramadol: NEGATIVE ng/mL (ref <100)
Opiates: NEGATIVE ng/mL (ref <100)
Oxycodone: NEGATIVE ng/mL (ref <100)
Tramadol: NEGATIVE ng/mL (ref <100)

## 2023-10-08 LAB — DM TEMPLATE

## 2023-10-14 ENCOUNTER — Other Ambulatory Visit (HOSPITAL_BASED_OUTPATIENT_CLINIC_OR_DEPARTMENT_OTHER): Payer: Self-pay

## 2023-10-28 ENCOUNTER — Encounter: Payer: Self-pay | Admitting: Medical

## 2023-10-28 ENCOUNTER — Ambulatory Visit (INDEPENDENT_AMBULATORY_CARE_PROVIDER_SITE_OTHER): Payer: Medicare (Managed Care) | Admitting: Medical

## 2023-10-28 VITALS — BP 138/79 | HR 100 | Temp 98.0°F | Resp 18 | Ht 67.0 in | Wt 167.0 lb

## 2023-10-28 DIAGNOSIS — E785 Hyperlipidemia, unspecified: Secondary | ICD-10-CM

## 2023-10-28 DIAGNOSIS — M25551 Pain in right hip: Secondary | ICD-10-CM

## 2023-10-28 DIAGNOSIS — R350 Frequency of micturition: Secondary | ICD-10-CM | POA: Diagnosis not present

## 2023-10-28 DIAGNOSIS — R700 Elevated erythrocyte sedimentation rate: Secondary | ICD-10-CM

## 2023-10-28 DIAGNOSIS — I10 Essential (primary) hypertension: Secondary | ICD-10-CM

## 2023-10-28 LAB — CBC WITH DIFFERENTIAL/PLATELET
Basophils Absolute: 0 10*3/uL (ref 0.0–0.1)
Basophils Relative: 0.9 % (ref 0.0–3.0)
Eosinophils Absolute: 0.2 10*3/uL (ref 0.0–0.7)
Eosinophils Relative: 4.6 % (ref 0.0–5.0)
HCT: 41.2 % (ref 39.0–52.0)
Hemoglobin: 13.5 g/dL (ref 13.0–17.0)
Lymphocytes Relative: 50.8 % — ABNORMAL HIGH (ref 12.0–46.0)
Lymphs Abs: 2.4 10*3/uL (ref 0.7–4.0)
MCHC: 32.8 g/dL (ref 30.0–36.0)
MCV: 89.3 fL (ref 78.0–100.0)
Monocytes Absolute: 0.8 10*3/uL (ref 0.1–1.0)
Monocytes Relative: 16.3 % — ABNORMAL HIGH (ref 3.0–12.0)
Neutro Abs: 1.3 10*3/uL — ABNORMAL LOW (ref 1.4–7.7)
Neutrophils Relative %: 27.4 % — ABNORMAL LOW (ref 43.0–77.0)
Platelets: 275 10*3/uL (ref 150.0–400.0)
RBC: 4.61 Mil/uL (ref 4.22–5.81)
RDW: 12.9 % (ref 11.5–15.5)
WBC: 4.7 10*3/uL (ref 4.0–10.5)

## 2023-10-28 LAB — LIPID PANEL
Cholesterol: 165 mg/dL (ref 0–200)
HDL: 49 mg/dL (ref >39.00)
LDL Cholesterol: 96 mg/dL (ref 0–99)
NonHDL: 115.85
Total CHOL/HDL Ratio: 3
Triglycerides: 99 mg/dL (ref 0.0–149.0)
VLDL: 19.8 mg/dL (ref 0.0–40.0)

## 2023-10-28 LAB — COMPREHENSIVE METABOLIC PANEL
ALT: 29 U/L (ref 0–53)
AST: 28 U/L (ref 0–37)
Albumin: 4.2 g/dL (ref 3.5–5.2)
Alkaline Phosphatase: 70 U/L (ref 39–117)
BUN: 17 mg/dL (ref 6–23)
CO2: 28 meq/L (ref 19–32)
Calcium: 9.8 mg/dL (ref 8.4–10.5)
Chloride: 101 meq/L (ref 96–112)
Creatinine, Ser: 1.02 mg/dL (ref 0.40–1.50)
GFR: 75.42 mL/min (ref >60.00)
Glucose, Bld: 92 mg/dL (ref 70–99)
Potassium: 3.9 meq/L (ref 3.5–5.1)
Sodium: 138 meq/L (ref 135–145)
Total Bilirubin: 0.7 mg/dL (ref 0.2–1.2)
Total Protein: 8.1 g/dL (ref 6.0–8.3)

## 2023-10-28 LAB — SEDIMENTATION RATE: Sed Rate: 31 mm/h — ABNORMAL HIGH (ref 0–20)

## 2023-10-28 LAB — PSA: PSA: 1.86 ng/mL (ref 0.10–4.00)

## 2023-10-28 NOTE — Patient Instructions (Signed)
Right Hip Pain Resolved back pain with Lyrica 200mg  BID. New transient sharp pain in the right hip and thigh, aggravated by certain movements. No loosening of equipment or infection in the right hip as per orthopedic evaluation. -Continue Lyrica 200mg  BID and Celebrex 100mg  BID. -Use crutches as needed, especially on hard surfaces. -Repeat sed rate and infection finding cells to monitor inflammation.  Hyperlipidemia On Atorvastatin three times a week due to previous side effects (knee pain) with daily use. Last cholesterol check was within normal limits. -Continue Atorvastatin three times a week. -Repeat lipid panel and metabolic panel today.  Hypertension Controlled on Hydrochlorothiazide 25mg  daily, though patient reports increased urination. -Continue Hydrochlorothiazide 25mg  daily. -Offer to switch to a different class of antihypertensive if diuretic side effects become bothersome.  Follow-up To be determined based on lab results. If hip pain worsens, patient to notify the office for potential referral back to orthopedist.

## 2023-10-28 NOTE — Progress Notes (Signed)
Subjective:    Patient ID: Phillip Hughes, male    DOB: 02-22-1956, 67 y.o.   MRN: [id number]  HPI Discussed the use of AI scribe software for clinical note transcription with the patient, who gave verbal consent to proceed.  History of Present Illness   The patient, with a history of right hip surgery, presents with intermittent sharp pain in the right hip and down the leg. The pain, which lasts for seconds, is triggered by certain movements and occurs once or twice a day. 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 The pain has since resolved.   The patient is currently on Lyrica 200mg  twice daily, which was initially prescribed for back pain that has since resolved completely. He also takes Celebrex 100mg  twice daily for the right hip pain. The patient uses crutches as needed, especially when walking on concrete, but tries to avoid using them at home. Overall much better than prior.   The patient has seen an orthopedist who confirmed that there was no loosening of the equipment in the right hip and no infection. The patient denies any fevers, chills, or sweats.  In addition to the hip pain, the patient has a history of high cholesterol and is currently on atorvastatin three times a week. He had previously experienced knee soreness, which resolved upon decreasing the dose of Crestor.    The patient also takes hydrochlorothiazide 25mg  daily for hypertension, but reports increased urination as a side effect.        Review of Systems See hpi    Objective:   Physical Exam  General Appearance- Not in acute distress.       Chest and Lung Exam Auscultation: Breath sounds:-Normal. Clear even and unlabored. Adventitious sounds:- No Adventitious sounds.   Cardiovascular Auscultation:Rythm - Regular, rate and rythm. Heart Sounds -Normal heart sounds.   Abdomen Inspection:-Inspection Normal.   Palpation/Perucssion: Palpation and Percussion of the abdomen reveal- Non Tender, No Rebound tenderness, No rigidity(Guarding) and No Palpable abdominal masses.  Liver:-Normal.  Spleen:- Normal.    Back No Mid lumbar spine tenderness to palpation.  No Pain on straight leg lift. Pain on lateral movements and flexion/extension of the spine.   Lower ext neurologic   L5-S1 sensation intact bilaterally. Normal patellar reflexes bilaterally. No foot drop bilaterally.    Rt hip- no pain on direct palpation but rt hip is stiff on range of motion.      Assessment & Plan:  Assessment and Plan    Right Hip Pain Resolved back pain with Lyrica 200mg  BID. New transient sharp pain in the right hip and thigh, aggravated by certain movements. No loosening of equipment or infection in the right hip as per orthopedic evaluation. -Continue Lyrica 200mg  BID and Celebrex 100mg  BID. -Use crutches as needed, especially on hard surfaces. -Repeat sed rate and infection finding cells to monitor inflammation.  Hyperlipidemia On Atorvastatin three times a week due to previous side effects (knee pain) with daily use. Last cholesterol check was within normal limits. -Continue Atorvastatin three times a week. -Repeat lipid panel and metabolic panel today.  Hypertension Controlled on Hydrochlorothiazide 25mg  daily, though patient reports increased urination. -Continue Hydrochlorothiazide 25mg  daily. -Offer to switch to a different class of antihypertensive if diuretic side effects become bothersome.  Follow-up To be determined based on lab results. If hip pain worsens, patient to notify the office for potential referral back to  orthopedist.       Esperanza Richters, PA-C

## 2023-11-07 ENCOUNTER — Other Ambulatory Visit (HOSPITAL_BASED_OUTPATIENT_CLINIC_OR_DEPARTMENT_OTHER): Payer: Self-pay

## 2023-11-07 ENCOUNTER — Ambulatory Visit (INDEPENDENT_AMBULATORY_CARE_PROVIDER_SITE_OTHER): Payer: Medicare (Managed Care) | Admitting: Medical

## 2023-11-07 VITALS — BP 130/74 | HR 93 | Temp 98.0°F | Resp 18 | Ht 67.0 in | Wt 171.8 lb

## 2023-11-07 DIAGNOSIS — R700 Elevated erythrocyte sedimentation rate: Secondary | ICD-10-CM

## 2023-11-07 DIAGNOSIS — E538 Deficiency of other specified B group vitamins: Secondary | ICD-10-CM

## 2023-11-07 MED ORDER — CYANOCOBALAMIN 1000 MCG/ML IJ SOLN
1000.0000 ug | Freq: Once | INTRAMUSCULAR | Status: AC
  Administered 2023-11-07: 1000 ug via INTRAMUSCULAR

## 2023-11-07 NOTE — Patient Instructions (Signed)
Hip Pain Persistent, intermittent sharp pain, manageable with current regimen. Activities of daily living improved with med. Without med had difficulty due to excess pain.. -Continue Lyrica 200mg  BID and Celebrex 100mg  BID. -Consider referral  back to orthopedist if pain worsens.  Hypertension Controlled with Hydrochlorothiazide 25mg  daily. -Continue current medication.  Hyperlipidemia Managed with Crestor 10mg  three times a week (Monday, Wednesday, Friday). -Continue current medication.  Vitamin B12 Deficiency Undergoing monthly B12 injections, with one remaining. -Check B12 level in one month. -Based on results, consider over-the-counter low dose B12 supplementation.   Follow up 4 months or sooner if needed

## 2023-11-07 NOTE — Progress Notes (Addendum)
   Subjective:    Patient ID: Phillip Hughes, male    DOB: September 17, 1956, 67 y.o.   MRN: [id number]  HPI Discussed the use of AI scribe software for clinical note transcription with the patient, who gave verbal consent to proceed.  History of Present Illness   The patient, with a history of hip pain, hypertension, hyperlipidemia, and vitamin B12 deficiency, reports persistent, albeit tolerable, hip discomfort. The pain, described as sharp and stabbing, is intermittent and not constant throughout the day. The pain is manageable and only mildly affects daily activities. The patient is currently on Lyrica 200mg  twice daily and Celebrex 100mg  twice daily for pain management. He uses crutches for ambulation when going out due to increased soreness with prolonged walking.  The patient also has a history of back pain, which has significantly improved and is no longer a primary concern. The patient reports a sharp pain radiating down the lateral aspect of the thigh. Pt advised if back pain causing radiating pain to leg then would repeat prior lumbar xray done previous in 2023.  For hypertension, the patient is on hydrochlorothiazide 25mg , and his blood pressure is well-controlled. He is also on Crestor for hyperlipidemia, taken three times a week. The patient has been receiving monthly B12 injections for the past five months due to a previously diagnosed B12 deficiency.      Pt offered pneumonia vaccine and flu vaccine.  Review of Systems See hpi.    Objective:   Physical Exam  General Appearance- Not in acute distress.       Chest and Lung Exam Auscultation: Breath sounds:-Normal. Clear even and unlabored. Adventitious sounds:- No Adventitious sounds.   Cardiovascular Auscultation:Rythm - Regular, rate and rythm. Heart Sounds -Normal heart sounds.   Abdomen Inspection:-Inspection Normal.  Palpation/Perucssion: Palpation and Percussion of the abdomen reveal- Non Tender, No Rebound tenderness,  No rigidity(Guarding) and No Palpable abdominal masses.  Liver:-Normal.  Spleen:- Normal.    Back No Mid lumbar spine tenderness to palpation.  No Pain on straight leg lift. Pain on lateral movements and flexion/extension of the spine.   Lower ext neurologic   L5-S1 sensation intact bilaterally. Normal patellar reflexes bilaterally. No foot drop bilaterally.    Rt hip- no pain on direct palpation but rt hip is stiff on range of motion.      Assessment & Plan:   Assessment and Plan    Hip Pain Persistent, intermittent sharp pain, manageable with current regimen. Activities of daily living improved with med. Without med had difficulty due to excess pain.. -Continue Lyrica 200mg  BID and Celebrex 100mg  BID. -Consider referral  back to orthopedist if pain worsens.  Hypertension Controlled with Hydrochlorothiazide 25mg  daily. -Continue current medication.  Hyperlipidemia Managed with Crestor 10mg  three times a week (Monday, Wednesday, Friday). -Continue current medication.  Vitamin B12 Deficiency Undergoing monthly B12 injections, with one remaining. -Check B12 level in one month. -Based on results, consider over-the-counter low dose B12 supplementation.   Follow up 4 months or sooner if needed   [employer], PA-C

## 2023-11-23 ENCOUNTER — Telehealth: Payer: Self-pay | Admitting: Medical

## 2023-11-23 ENCOUNTER — Other Ambulatory Visit (HOSPITAL_BASED_OUTPATIENT_CLINIC_OR_DEPARTMENT_OTHER): Payer: Self-pay

## 2023-11-23 MED ORDER — ROSUVASTATIN CALCIUM 10 MG PO TABS
ORAL_TABLET | ORAL | 3 refills | Status: DC
  Filled 2023-11-23: qty 36, 84d supply, fill #0

## 2023-11-23 MED ORDER — ATORVASTATIN CALCIUM 10 MG PO TABS
ORAL_TABLET | ORAL | 3 refills | Status: DC
  Filled 2023-11-23: qty 36, 84d supply, fill #0

## 2023-11-23 NOTE — Telephone Encounter (Signed)
[facility] pharmacist called and asked for someone to call them to provide clarification on if the patients instructions changed for Rosuvastatin. She explained that before the instructions were to take 1x/daily, 90 day supply. Currently it says, 1 tablet 3x week. Please call and advise at [phone]. She stated anyone there can answer.

## 2023-11-23 NOTE — Telephone Encounter (Signed)
I see the note from 11/07/23 say 1 tablet [facility]) , is this still correct?

## 2023-11-23 NOTE — Telephone Encounter (Signed)
Rx crestor sent to pharmacy. 

## 2023-11-23 NOTE — Addendum Note (Signed)
Addended by: Gwenevere Abbot on: 11/23/2023 11:36 AM   Modules accepted: Orders

## 2023-11-30 ENCOUNTER — Other Ambulatory Visit (INDEPENDENT_AMBULATORY_CARE_PROVIDER_SITE_OTHER): Payer: Medicare (Managed Care)

## 2023-11-30 DIAGNOSIS — R700 Elevated erythrocyte sedimentation rate: Secondary | ICD-10-CM | POA: Diagnosis not present

## 2023-11-30 DIAGNOSIS — E538 Deficiency of other specified B group vitamins: Secondary | ICD-10-CM | POA: Diagnosis not present

## 2023-11-30 LAB — VITAMIN B12: Vitamin B-12: 507 pg/mL (ref 211–911)

## 2023-11-30 LAB — SEDIMENTATION RATE: Sed Rate: 22 mm/h — ABNORMAL HIGH (ref 0–20)

## 2023-12-05 ENCOUNTER — Other Ambulatory Visit (HOSPITAL_BASED_OUTPATIENT_CLINIC_OR_DEPARTMENT_OTHER): Payer: Self-pay

## 2024-01-12 ENCOUNTER — Other Ambulatory Visit (HOSPITAL_BASED_OUTPATIENT_CLINIC_OR_DEPARTMENT_OTHER): Payer: Self-pay

## 2024-02-02 DIAGNOSIS — M25811 Other specified joint disorders, right shoulder: Secondary | ICD-10-CM | POA: Diagnosis not present

## 2024-02-02 DIAGNOSIS — R222 Localized swelling, mass and lump, trunk: Secondary | ICD-10-CM | POA: Diagnosis not present

## 2024-02-02 DIAGNOSIS — I10 Essential (primary) hypertension: Secondary | ICD-10-CM | POA: Diagnosis not present

## 2024-02-03 ENCOUNTER — Other Ambulatory Visit (HOSPITAL_BASED_OUTPATIENT_CLINIC_OR_DEPARTMENT_OTHER): Payer: Self-pay

## 2024-02-08 ENCOUNTER — Other Ambulatory Visit (HOSPITAL_BASED_OUTPATIENT_CLINIC_OR_DEPARTMENT_OTHER): Payer: Self-pay

## 2024-02-08 ENCOUNTER — Other Ambulatory Visit: Payer: Self-pay | Admitting: Medical

## 2024-02-08 MED ORDER — CELECOXIB 100 MG PO CAPS
100.0000 mg | ORAL_CAPSULE | Freq: Two times a day (BID) | ORAL | 1 refills | Status: DC
  Filled 2024-02-08: qty 60, 30d supply, fill #0
  Filled 2024-04-16: qty 60, 30d supply, fill #1

## 2024-02-08 NOTE — Telephone Encounter (Signed)
 Copied from CRM [id number]. Topic: Clinical - Medication Refill >> Feb 08, 2024 11:33 AM Drema Balzarine wrote: Most Recent Primary Care Visit:  Provider: [facility]  Department: [facility]  Visit Type: LAB  Date: 11/30/2023  Medication: celecoxib (CELEBREX) 100 MG capsule - helps with patients inflammation  Has the patient contacted their pharmacy? No (Agent: If no, request that the patient contact the pharmacy for the refill. If patient does not wish to contact the pharmacy document the reason why and proceed with request.) (Agent: If yes, when and what did the pharmacy advise?)  Is this the correct pharmacy for this prescription? Yes If no, delete pharmacy and type the correct one.  This is the patient's preferred pharmacy:   [facility] [address] [place] [address] Phone: [phone] Fax: [phone]   Has the prescription been filled recently? Yes  Is the patient out of the medication? Yes, took the last one this morning   Has the patient been seen for an appointment in the last year OR does the patient have an upcoming appointment? Yes  Can we respond through MyChart? No  Agent: Please be advised that Rx refills may take up to 3 business days. We ask that you follow-up with your pharmacy.

## 2024-02-09 ENCOUNTER — Other Ambulatory Visit (HOSPITAL_BASED_OUTPATIENT_CLINIC_OR_DEPARTMENT_OTHER): Payer: Self-pay

## 2024-02-15 DIAGNOSIS — M67813 Other specified disorders of tendon, right shoulder: Secondary | ICD-10-CM | POA: Diagnosis not present

## 2024-02-15 DIAGNOSIS — M67911 Unspecified disorder of synovium and tendon, right shoulder: Secondary | ICD-10-CM | POA: Diagnosis not present

## 2024-02-15 DIAGNOSIS — R2231 Localized swelling, mass and lump, right upper limb: Secondary | ICD-10-CM | POA: Diagnosis not present

## 2024-02-15 DIAGNOSIS — M75121 Complete rotator cuff tear or rupture of right shoulder, not specified as traumatic: Secondary | ICD-10-CM | POA: Diagnosis not present

## 2024-02-28 ENCOUNTER — Ambulatory Visit (INDEPENDENT_AMBULATORY_CARE_PROVIDER_SITE_OTHER): Payer: Medicare (Managed Care)

## 2024-02-28 VITALS — Ht 67.0 in | Wt 171.0 lb

## 2024-02-28 DIAGNOSIS — Z0000 Encounter for general adult medical examination without abnormal findings: Secondary | ICD-10-CM | POA: Diagnosis not present

## 2024-02-28 NOTE — Progress Notes (Signed)
 Subjective:   Phillip Hughes is a 68 y.o. who presents for a Medicare Wellness preventive visit.  Visit Complete: Virtual I connected with  Berlinda Last on 02/28/24 by a audio enabled telemedicine application and verified that I am speaking with the correct person using two identifiers.  Patient Location: Home  Provider Location: Home Office  I discussed the limitations of evaluation and management by telemedicine. The patient expressed understanding and agreed to proceed.  Vital Signs: Because this visit was a virtual/telehealth visit, some criteria may be missing or patient reported. Any vitals not documented were not able to be obtained and vitals that have been documented are patient reported.  VideoDeclined- This patient declined Librarian, academic. Therefore the visit was completed with audio only.  Persons Participating in Visit: Patient.  AWV Questionnaire: Yes: Patient Medicare AWV questionnaire was completed by the patient on 02/27/24; I have confirmed that all information answered by patient is correct and no changes since this date.  Cardiac Risk Factors include: advanced age (>67men, >46 women);male gender     Objective:    Today's Vitals   02/28/24 1358  Weight: 171 lb (77.6 kg)  Height: 5\' 7"  (1.702 m)   Body mass index is 26.78 kg/m.     02/28/2024    2:06 PM 02/25/2023   11:51 PM 12/21/2022    8:51 AM 11/28/2022    1:14 PM 07/16/2022   10:37 PM 05/28/2022   10:36 AM 03/21/2021    1:56 AM  Advanced Directives  Does Patient Have a Medical Advance Directive? No No No No No No No  Would patient like information on creating a medical advance directive? No - Patient declined No - Patient declined No - Patient declined  No - Patient declined No - Patient declined No - Patient declined    Current Medications (verified) Outpatient Encounter Medications as of 02/28/2024  Medication Sig   celecoxib (CELEBREX) 100 MG capsule Take 1 capsule  (100 mg total) by mouth 2 (two) times daily.   celecoxib (CELEBREX) 200 MG capsule Take 1 capsule (200 mg total) by mouth 2 (two) times daily as needed.   diclofenac Sodium (VOLTAREN) 1 % GEL Apply 2 g topically 4 (four) times daily as needed.   hydrochlorothiazide (HYDRODIURIL) 25 MG tablet Take 1 tablet (25 mg total) by mouth daily.   oxyCODONE-acetaminophen (PERCOCET) 7.5-325 MG tablet Take 1 tablet by mouth every 8 (eight) hours as needed. (Patient not taking: Reported on 02/28/2024)   pregabalin (LYRICA) 200 MG capsule Take 1 capsule (200 mg total) by mouth 2 (two) times daily.   rosuvastatin (CRESTOR) 10 MG tablet Take 1 tablet (10 mg total) by mouth daily.   rosuvastatin (CRESTOR) 10 MG tablet Take 1 tablet by mouth daily on Monday, Wednesday and Friday.   No facility-administered encounter medications on file as of 02/28/2024.    Allergies (verified) Gabapentin and Statins   History: Past Medical History:  Diagnosis Date   Hypertension    Sciatica    Sinusitis    Past Surgical History:  Procedure Laterality Date   BACK SURGERY     HERNIA REPAIR     HIP SURGERY     NECK SURGERY     History reviewed. No pertinent family history. Social History   Socioeconomic History   Marital status: Married    Spouse name: Not on file   Number of children: Not on file   Years of education: Not on file   Highest education level:  11th grade  Occupational History   Not on file  Tobacco Use   Smoking status: Former    Current packs/day: 1.00    Average packs/day: 1 pack/day for 28.0 years (28.0 ttl pk-yrs)    Types: Cigarettes   Smokeless tobacco: Never  Vaping Use   Vaping status: Never Used  Substance and Sexual Activity   Alcohol use: Yes    Comment: rarely   Drug use: No   Sexual activity: Not on file  Other Topics Concern   Not on file  Social History Narrative   Not on file   Social Drivers of Health   Financial Resource Strain: Low Risk  (02/28/2024)   Overall  Financial Resource Strain (CARDIA)    Difficulty of Paying Living Expenses: Not hard at all  Food Insecurity: No Food Insecurity (02/28/2024)   Hunger Vital Sign    Worried About Running Out of Food in the Last Year: Never true    Ran Out of Food in the Last Year: Never true  Transportation Needs: No Transportation Needs (02/28/2024)   PRAPARE - Administrator, Civil Service (Medical): No    Lack of Transportation (Non-Medical): No  Physical Activity: Insufficiently Active (02/28/2024)   Exercise Vital Sign    Days of Exercise per Week: 3 days    Minutes of Exercise per Session: 10 min  Stress: No Stress Concern Present (02/28/2024)   [employer] of Occupational Health - Occupational Stress Questionnaire    Feeling of Stress : Not at all  Social Connections: Moderately Isolated (02/28/2024)   Social Connection and Isolation Panel [NHANES]    Frequency of Communication with Friends and Family: More than three times a week    Frequency of Social Gatherings with Friends and Family: Three times a week    Attends Religious Services: Never    Active Member of Clubs or Organizations: No    Attends Engineer, structural: Not on file    Marital Status: Married    Tobacco Counseling Counseling given: Not Answered    Clinical Intake:  Pre-visit preparation completed: Yes  Pain : No/denies pain     BMI - recorded: 26.78 Nutritional Status: BMI 25 -29 Overweight Nutritional Risks: None Diabetes: No  How often do you need to have someone help you when you read instructions, pamphlets, or other written materials from your doctor or pharmacy?: 1 - Never  Interpreter Needed?: No  Information entered by :: Theresa Mulligan LPN   Activities of Daily Living     02/28/2024    2:05 PM 02/27/2024    8:31 AM  In your present state of health, do you have any difficulty performing the following activities:  Hearing? 0 0  Vision? 0 0  Difficulty concentrating or  making decisions? 0 0  Walking or climbing stairs? 0 0  Dressing or bathing? 0 0  Doing errands, shopping? 0   Preparing Food and eating ? N N  Using the Toilet? N N  In the past six months, have you accidently leaked urine? N N  Do you have problems with loss of bowel control? N N  Managing your Medications? N N  Managing your Finances? N N  Housekeeping or managing your Housekeeping? N N    Patient Care Team: Saguier, Kateri Mc as PCP - General (Internal Medicine)  Indicate any recent Medical Services you may have received from other than [facility] providers in the past year (date may be approximate).  Assessment:   This is a routine wellness examination for [place].  Hearing/Vision screen Hearing Screening - Comments:: Denies hearing difficulties   Vision Screening - Comments:: Wears rx glasses - up to date with routine eye exams with  [facility]   Goals Addressed               This Visit's Progress     Increase physical activity (pt-stated)         Depression Screen      02/28/2024    2:12 PM 12/14/2022    9:34 AM  PHQ 2/9 Scores  PHQ - 2 Score 0 0    Fall Risk     02/28/2024    2:05 PM 02/27/2024    8:31 AM 03/23/2023    9:15 AM 12/14/2022    9:34 AM  Fall Risk   Falls in the past year? 0 0 0 0  Number falls in past yr: 0 0 0 0  Injury with Fall? 0 0 0 0  Risk for fall due to : No Fall Risks     Follow up Falls prevention discussed;Falls evaluation completed   Falls evaluation completed    MEDICARE RISK AT HOME:   Medicare Risk at Home Any stairs in or around the home?: Yes If so, are there any without handrails?: No Home free of loose throw rugs in walkways, pet beds, electrical cords, etc?: Yes Life alert?: No Use of a cane, walker or w/c?: No Grab bars in the bathroom?: No Shower chair or bench in shower?: No Elevated toilet seat or a handicapped toilet?: No  TIMED UP AND GO:  Was the test performed?  No  Cognitive Function: 6CIT  completed        02/28/2024    2:06 PM  6CIT Screen  What Year? 0 points  What month? 0 points  What time? 0 points  Count back from 20 0 points  Months in reverse 0 points  Repeat phrase 0 points  Total Score 0 points    Immunizations Immunization History  Administered Date(s) Administered   Fluad Quad(high Dose 65+) 11/18/2020, 10/11/2022   Influenza-Unspecified 09/15/2015, 09/26/2021   PFIZER(Purple Top)SARS-COV-2 Vaccination 03/20/2020, 04/10/2020, 10/13/2020, 04/02/2021   Pfizer Covid-19 Vaccine Bivalent Booster 58yrs & up 08/27/2021   Pfizer(Comirnaty)Fall Seasonal Vaccine 12 years and older 10/11/2022    Screening Tests Health Maintenance  Topic Date Due   Hepatitis C Screening  Never done   DTaP/Tdap/Td (1 - Tdap) Never done   Zoster Vaccines- Shingrix (1 of 2) Never done   Lung Cancer Screening  Never done   Pneumonia Vaccine 31+ Years old (1 of 1 - PCV) Never done   COVID-19 Vaccine (7 - 2024-25 season) 08/14/2023   INFLUENZA VACCINE  03/12/2024 (Originally 07/14/2023)   Medicare Annual Wellness (AWV)  02/27/2025   Colonoscopy  05/27/2027   HPV VACCINES  Aged Out    Health Maintenance  Health Maintenance Due  Topic Date Due   Hepatitis C Screening  Never done   DTaP/Tdap/Td (1 - Tdap) Never done   Zoster Vaccines- Shingrix (1 of 2) Never done   Lung Cancer Screening  Never done   Pneumonia Vaccine 51+ Years old (1 of 1 - PCV) Never done   COVID-19 Vaccine (7 - 2024-25 season) 08/14/2023   Health Maintenance Items Addressed:    Additional Screening:  Vision Screening: Recommended annual ophthalmology exams for early detection of glaucoma and other disorders of the eye.  Dental Screening: Recommended annual  dental exams for proper oral hygiene  Community Resource Referral / Chronic Care Management: CRR required this visit?  No   CCM required this visit?  No     Plan:     I have personally reviewed and noted the following in the patient's  chart:   Medical and social history Use of alcohol, tobacco or illicit drugs  Current medications and supplements including opioid prescriptions. Patient is not currently taking opioid prescriptions. Functional ability and status Nutritional status Physical activity Advanced directives List of other physicians Hospitalizations, surgeries, and ER visits in previous 12 months Vitals Screenings to include cognitive, depression, and falls Referrals and appointments  In addition, I have reviewed and discussed with patient certain preventive protocols, quality metrics, and best practice recommendations. A written personalized care plan for preventive services as well as general preventive health recommendations were provided to patient.     Tillie Rung, LPN   01/26/864   After Visit Summary: (MyChart) Due to this being a telephonic visit, the after visit summary with patients personalized plan was offered to patient via MyChart   Notes: Nothing significant to report at this time.

## 2024-02-28 NOTE — Patient Instructions (Addendum)
 Phillip Hughes , Thank you for taking time to come for your Medicare Wellness Visit. I appreciate your ongoing commitment to your health goals. Please review the following plan we discussed and let me know if I can assist you in the future.   Referrals/Orders/Follow-Ups/Clinician Recommendations:   This is a list of the screening recommended for you and due dates:  Health Maintenance  Topic Date Due   Hepatitis C Screening  Never done   DTaP/Tdap/Td vaccine (1 - Tdap) Never done   Zoster (Shingles) Vaccine (1 of 2) Never done   Screening for Lung Cancer  Never done   Pneumonia Vaccine (1 of 1 - PCV) Never done   COVID-19 Vaccine (7 - 2024-25 season) 08/14/2023   Flu Shot  03/12/2024*   Medicare Annual Wellness Visit  02/27/2025   Colon Cancer Screening  05/27/2027   HPV Vaccine  Aged Out  *Topic was postponed. The date shown is not the original due date.    Advanced directives: (Declined) Advance directive discussed with you today. Even though you declined this today, please call our office should you change your mind, and we can give you the proper paperwork for you to fill out.  Next Medicare Annual Wellness Visit scheduled for next year: Yes

## 2024-03-06 DIAGNOSIS — M25511 Pain in right shoulder: Secondary | ICD-10-CM | POA: Diagnosis not present

## 2024-03-06 DIAGNOSIS — M19011 Primary osteoarthritis, right shoulder: Secondary | ICD-10-CM | POA: Diagnosis not present

## 2024-03-06 DIAGNOSIS — M75101 Unspecified rotator cuff tear or rupture of right shoulder, not specified as traumatic: Secondary | ICD-10-CM | POA: Diagnosis not present

## 2024-03-06 DIAGNOSIS — M7551 Bursitis of right shoulder: Secondary | ICD-10-CM | POA: Diagnosis not present

## 2024-03-06 DIAGNOSIS — G8929 Other chronic pain: Secondary | ICD-10-CM | POA: Diagnosis not present

## 2024-03-08 ENCOUNTER — Encounter: Payer: Self-pay | Admitting: Medical

## 2024-03-27 ENCOUNTER — Other Ambulatory Visit: Payer: Self-pay

## 2024-03-27 ENCOUNTER — Ambulatory Visit: Attending: Sports Medicine

## 2024-03-27 DIAGNOSIS — G8929 Other chronic pain: Secondary | ICD-10-CM | POA: Insufficient documentation

## 2024-03-27 DIAGNOSIS — M5441 Lumbago with sciatica, right side: Secondary | ICD-10-CM | POA: Insufficient documentation

## 2024-03-27 DIAGNOSIS — M25561 Pain in right knee: Secondary | ICD-10-CM | POA: Diagnosis not present

## 2024-03-27 DIAGNOSIS — R252 Cramp and spasm: Secondary | ICD-10-CM | POA: Insufficient documentation

## 2024-03-27 DIAGNOSIS — M25511 Pain in right shoulder: Secondary | ICD-10-CM | POA: Insufficient documentation

## 2024-03-27 NOTE — Therapy (Signed)
 OUTPATIENT PHYSICAL THERAPY SHOULDER EVALUATION   Patient Name: Phillip Hughes MRN: [id number] DOB:06/18/1956, 68 y.o., male Today's Date: 03/27/2024  END OF SESSION:  PT End of Session - 03/27/24 1325     Visit Number 1    Date for PT Re-Evaluation 05/22/24    Progress Note Due on Visit 10    PT Start Time 0845    PT Stop Time 0930    PT Time Calculation (min) 45 min    Activity Tolerance Patient tolerated treatment well    Behavior During Therapy [facility] for tasks assessed/performed             Past Medical History:  Diagnosis Date   Hypertension    Sciatica    Sinusitis    Past Surgical History:  Procedure Laterality Date   BACK SURGERY     HERNIA REPAIR     HIP SURGERY     NECK SURGERY     Patient Active Problem List   Diagnosis Date Noted   Hx of total hip arthroplasty, right 02/28/2023   Degenerative tear of medial meniscus of right knee 12/01/2022   Lumbar radiculopathy 11/29/2022   Peroneal mononeuropathy, 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    PCP: Thersa Salt, PA  REFERRING PROVIDER: Magdalene River, MD  REFERRING DIAG: R rotator cuff tear R AC arthritis, R subacromial bursitis  THERAPY DIAG:  Chronic right shoulder pain  Rationale for Evaluation and Treatment: Rehabilitation  ONSET DATE: approximately one year  SUBJECTIVE:                                                                                                                                                                                      SUBJECTIVE STATEMENT: Pain chronic R shoulder.  Had injection but helped for only a couple of hours Notes worst pain with trying to sleep.  Can't sleep on R side, also notes with supine and L side lying but not as severe Hand dominance: Left  PERTINENT HISTORY: Progressive pain/ weakness R shoulder, referred by orthopedist  PAIN:  Are you having pain? Yes: NPRS scale: 0 to6 Pain location: R lateral upper arm and R upper traps  Pain description: aches at  night Aggravating factors: lifting out to side Relieving factors: position changes  PRECAUTIONS: None  RED FLAGS: None   WEIGHT BEARING RESTRICTIONS: No  FALLS:  Has patient fallen in last 6 months? No  LIVING ENVIRONMENT: Lives with: lives with their family and lives with their spouse Lives in: House/apartment Stairs: unkown Has following equipment at home:  crutches  OCCUPATION: retired  PLOF: Independent  PATIENT GOALS:be able to sleep better  NEXT MD VISIT:   OBJECTIVE:  Note:  Objective measures were completed at Evaluation unless otherwise noted.  DIAGNOSTIC FINDINGS:  MRI with R supraspinatus tear retracted 2 cm  PATIENT SURVEYS:  Quick Dash QuickDASH Score: 38.6 / 100 = 38.6 %  COGNITION: Overall cognitive status: Within functional limits for tasks assessed     SENSATION: WFL  POSTURE: Lean healthy appearance. In standing with R hip flexed 15 degrees, weight bearing on R forefoot  UPPER EXTREMITY ROM:   Active ROM Right eval Left Eval all wnl  Shoulder flexion -15   Shoulder extension    Shoulder abduction -15   Shoulder adduction    Shoulder internal rotation    Shoulder external rotation -10   Elbow flexion    Elbow extension    Wrist flexion    Wrist extension    Wrist ulnar deviation    Wrist radial deviation    Wrist pronation    Wrist supination    (Blank rows = not tested)  UPPER EXTREMITY MMT:  MMT Right eval Left Eval all wnl  Shoulder flexion 5   Shoulder extension    Shoulder abduction 4   Shoulder adduction    Shoulder internal rotation wnl   Shoulder external rotation 4   Middle trapezius 4 4  Lower trapezius    Elbow flexion    Elbow extension    Wrist flexion    Wrist extension    Wrist ulnar deviation    Wrist radial deviation    Wrist pronation    Wrist supination    Grip strength (lbs)    (Blank rows = not tested)  SHOULDER SPECIAL TESTS: Impingement tests: Hawkins/Kennedy impingement test: positive    JOINT MOBILITY TESTING:  All wnl   PALPATION:  Fatty lipoma palpated R superior AC jt  Non tender R supraspinatus.  Mild tenderness R infraspinatus and R upper traps Some tenderness medial R scapular retractors                                                                                                                             TREATMENT DATE: 03/27/24:  Kinesiotaping R shoulder to improve motor recruitment R rotator cuff and hopefully assist with pain with sleep 2 I pieces extending from ant and middle delts to upper and middle traps Inst in theraband ER, cues for fast movement,low amplitude movement yellow Also t hand shoulder ext at door to engage scapular retractors green   PATIENT EDUCATION: Education details: POC, goals Person educated: Patient Education method: Explanation, Demonstration, Tactile cues, Verbal cues, and Handouts Education comprehension: verbalized understanding, returned demonstration, and verbal cues required  HOME EXERCISE PROGRAM: Access Code: [email or web address] URL: [email or web address] Date: 03/27/2024 Prepared by: Shahara Hartsfield  Exercises - Shoulder External Rotation and Scapular Retraction with Resistance  - 1 x daily - 7 x weekly - 3 sets - 10 reps - Shoulder Extension with Resistance  - 1 x daily - 7 x weekly - 3 sets - 10 reps  ASSESSMENT:  CLINICAL  IMPRESSION: Patient is a 68 y.o. male who was evaluated today by physical therapy for R shoulder pain, rotator cuff tear.  His function, Rom with mild limitations also mild weakness with R shoulder abd, flex and ER. Also movement patterns asymmetrical on R , tends to shrug and shift R shoulder anteriorly with elevation.  Most difficulty is with sleeping, wakes him up frequently.  He is to return to referring orthopedist when completes formal PT.  Would recommend pain management techniques as needed .   OBJECTIVE IMPAIRMENTS: decreased strength, impaired UE functional use, postural  dysfunction, and pain.   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   REHAB POTENTIAL: Good  CLINICAL DECISION MAKING: Evolving/moderate complexity  EVALUATION COMPLEXITY: Moderate   GOALS: Goals reviewed with patient? Yes  SHORT TERM GOALS: Target date: 2 weeks 04/10/24  I HEP Baseline: Goal status: INITIAL   LONG TERM GOALS: Target date: 05/22/24  Quick dash QuickDASH Score: 38.6 / 100 = 38.6 % improve to 24% Baseline:  Goal status: INITIAL  2.  Improve R shoulder abduction and flexion strength to 5/5  Baseline: 4/5 Goal status: INITIAL  3.  Improve sleeping interruption from R shoulder pain by 50% Baseline: currently wakes nightly Goal status: INITIAL   PLAN:  PT FREQUENCY: 1x/week  PT DURATION: 8 weeks  PLANNED INTERVENTIONS: 97110-Therapeutic exercises, 97530- Therapeutic activity, [id number]- Neuromuscular re-education, 97535- Self Care, 29562- Manual therapy, and 97033- Ionotophoresis 4mg /ml Dexamethasone  PLAN FOR NEXT SESSION: reassess strength, AROM , mechanics R shoulder modalities as needed, re tape, advance ex appropriately   Emmersyn Kratzke L Coy Vandoren, PT, DPT, OCS 03/27/2024, 1:52 PM

## 2024-04-10 ENCOUNTER — Other Ambulatory Visit: Payer: Self-pay

## 2024-04-10 ENCOUNTER — Ambulatory Visit

## 2024-04-10 DIAGNOSIS — R252 Cramp and spasm: Secondary | ICD-10-CM

## 2024-04-10 DIAGNOSIS — M25511 Pain in right shoulder: Secondary | ICD-10-CM | POA: Diagnosis not present

## 2024-04-10 DIAGNOSIS — G8929 Other chronic pain: Secondary | ICD-10-CM

## 2024-04-10 NOTE — Therapy (Signed)
 OUTPATIENT PHYSICAL THERAPY SHOULDER EVALUATION   Patient Name: Phillip Hughes MRN: [id number] DOB:1956-02-18, 68 y.o., male Today's Date: 04/10/2024  END OF SESSION:  PT End of Session - 04/10/24 1045     Visit Number 2    Date for PT Re-Evaluation 05/22/24    Progress Note Due on Visit 10    PT Start Time 0845    PT Stop Time 0930    PT Time Calculation (min) 45 min    Activity Tolerance Patient tolerated treatment well    Behavior During Therapy [facility] for tasks assessed/performed              Past Medical History:  Diagnosis Date   Hypertension    Sciatica    Sinusitis    Past Surgical History:  Procedure Laterality Date   BACK SURGERY     HERNIA REPAIR     HIP SURGERY     NECK SURGERY     Patient Active Problem List   Diagnosis Date Noted   Hx of total hip arthroplasty, right 02/28/2023   Degenerative tear of medial meniscus of right knee 12/01/2022   Lumbar radiculopathy 11/29/2022   Peroneal mononeuropathy, 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    PCP: Donnice Gale, PA  REFERRING PROVIDER: Rockford Churches, MD  REFERRING DIAG: R rotator cuff tear R AC arthritis, R subacromial bursitis  THERAPY DIAG:  Chronic right shoulder pain  Chronic right-sided low back pain with right-sided sciatica  Cramp and spasm  Chronic pain of right knee  Rationale for Evaluation and Treatment: Rehabilitation  ONSET DATE: approximately one year  SUBJECTIVE:                                                                                                                                                                                      SUBJECTIVE STATEMENT:04/10/24: improved with sleeping patterns.  Doing the exercises and they are really easy.  Overall shoulder improved.   Eval: Pain chronic R shoulder.  Had injection but helped for only a couple of hours Notes worst pain with trying to sleep.  Can't sleep on R side, also notes with supine and L side lying but not as severe Hand  dominance: Left  PERTINENT HISTORY: Progressive pain/ weakness R shoulder, referred by orthopedist  PAIN:  Are you having pain? Yes: NPRS scale: 0 to6 Pain location: R lateral upper arm and R upper traps  Pain description: aches at night Aggravating factors: lifting out to side Relieving factors: position changes  PRECAUTIONS: None  RED FLAGS: None   WEIGHT BEARING RESTRICTIONS: No  FALLS:  Has patient fallen in last 6 months? No  LIVING ENVIRONMENT: Lives with: lives with their  family and lives with their spouse Lives in: House/apartment Stairs: unkown Has following equipment at home:  crutches  OCCUPATION: retired  PLOF: Independent  PATIENT GOALS:be able to sleep better  NEXT MD VISIT:   OBJECTIVE:  Note: Objective measures were completed at Evaluation unless otherwise noted.  DIAGNOSTIC FINDINGS:  MRI with R supraspinatus tear retracted 2 cm  PATIENT SURVEYS:  Quick Dash QuickDASH Score: 38.6 / 100 = 38.6 %  COGNITION: Overall cognitive status: Within functional limits for tasks assessed     SENSATION: WFL  POSTURE: Lean healthy appearance. In standing with R hip flexed 15 degrees, weight bearing on R forefoot  UPPER EXTREMITY ROM:   Active ROM Right eval Left Eval all wnl  Shoulder flexion -15   Shoulder extension    Shoulder abduction -15   Shoulder adduction    Shoulder internal rotation    Shoulder external rotation -10   Elbow flexion    Elbow extension    Wrist flexion    Wrist extension    Wrist ulnar deviation    Wrist radial deviation    Wrist pronation    Wrist supination    (Blank rows = not tested)  UPPER EXTREMITY MMT:  MMT Right eval Left Eval all wnl 04/10/24 R  Shoulder flexion 5  5  Shoulder extension     Shoulder abduction 4  5  Shoulder adduction     Shoulder internal rotation wnl    Shoulder external rotation 4  5  Middle trapezius 4 4 4   Lower trapezius     Elbow flexion     Elbow extension     Wrist  flexion     Wrist extension     Wrist ulnar deviation     Wrist radial deviation     Wrist pronation     Wrist supination     Grip strength (lbs)     (Blank rows = not tested)  SHOULDER SPECIAL TESTS: Impingement tests: Hawkins/Kennedy impingement test: positive   JOINT MOBILITY TESTING:  All wnl   PALPATION:  Fatty lipoma palpated R superior AC jt  Non tender R supraspinatus.  Mild tenderness R infraspinatus and R upper traps Some tenderness medial R scapular retractors                                                                                                                             TREATMENT DATE:  04/10/24: Manual Kinesiotaping R shoulder to improve motor recruitment R rotator cuff and assist with pain with sleep 2 -I pieces extending from ant and middle delts to upper and middle traps Supine for manual stretching while stabilizing R scapula , for R upper traps and R levator scapulae 5 bouts each Rotational glides upper cervical spine for L and R rotation, gr 2 -3 Prone for HVLA cervicothoracic jxn B  NMR Progressed exercises:  Prone middle traps engagement with horizontal abd with R shoulder ER , 0, then 1, then  2#, total of 25 reps Prone R shoulder ER with humerus propped on table and with 2#, total of 25 reps, to fatigue, cues to avoid engaging upper traps R.   03/27/24:  Kinesiotaping R shoulder to improve motor recruitment R rotator cuff and hopefully assist with pain with sleep 2 I pieces extending from ant and middle delts to upper and middle traps Inst in theraband ER, cues for fast movement,low amplitude movement yellow Also t hand shoulder ext at door to engage scapular retractors green   PATIENT EDUCATION: Education details: POC, goals Person educated: Patient Education method: Explanation, Demonstration, Tactile cues, Verbal cues, and Handouts Education comprehension: verbalized understanding, returned demonstration, and verbal cues required  HOME  EXERCISE PROGRAM: Access Code: [email or web address] URL: [email or web address] Date: 03/27/2024 Prepared by: Deagen Krass  Exercises - Shoulder External Rotation and Scapular Retraction with Resistance  - 1 x daily - 7 x weekly - 3 sets - 10 reps - Shoulder Extension with Resistance  - 1 x daily - 7 x weekly - 3 sets - 10 reps  ASSESSMENT:  CLINICAL IMPRESSION: Patient is a 68 y.o. male who attended his 2nd physical therapy session due to R rotator cuff tear, weakness.  Reassessment of his R shoulder strength today was much improved from initial evaluation., also his mechanics with elevation R shoulder was improved with less shrugging.  He is experiencing some R upper traps pain and discomfort with R cervical rotation to 75 degrees.  Progressed the difficulty of his strengthening R shoulder and utilized manual techniques to address his cervical spine stiffness.  Overall improved, should continue to benefit from skilled PT to address his goals.     OBJECTIVE IMPAIRMENTS: decreased strength, impaired UE functional use, postural dysfunction, and pain.   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   REHAB POTENTIAL: Good  CLINICAL DECISION MAKING: Evolving/moderate complexity  EVALUATION COMPLEXITY: Moderate   GOALS: Goals reviewed with patient? Yes  SHORT TERM GOALS: Target date: 2 weeks 04/10/24  I HEP Baseline: Goal status: INITIAL   LONG TERM GOALS: Target date: 05/22/24  Quick dash QuickDASH Score: 38.6 / 100 = 38.6 % improve to 24% Baseline:  Goal status: INITIAL  2.  Improve R shoulder abduction and flexion strength to 5/5  Baseline: 4/5 Goal status:  INITIAL  3.  Improve sleeping interruption from R shoulder pain by 50% Baseline: currently wakes nightly Goal status: INITIAL   PLAN:  PT FREQUENCY: 1x/week  PT DURATION: 8 weeks  PLANNED INTERVENTIONS: 97110-Therapeutic exercises, 97530- Therapeutic activity, [id number]- Neuromuscular re-education, 97535- Self Care, 10272- Manual therapy, and 97033- Ionotophoresis 4mg /ml Dexamethasone   PLAN FOR NEXT SESSION: reassess strength, AROM , mechanics R shoulder modalities as needed, re tape, advance ex appropriately   Phillip Hughes, PT, DPT, OCS 04/10/2024, 10:46 AM

## 2024-04-16 ENCOUNTER — Other Ambulatory Visit (HOSPITAL_BASED_OUTPATIENT_CLINIC_OR_DEPARTMENT_OTHER): Payer: Self-pay

## 2024-04-17 ENCOUNTER — Ambulatory Visit: Attending: Sports Medicine

## 2024-04-17 DIAGNOSIS — M25511 Pain in right shoulder: Secondary | ICD-10-CM | POA: Insufficient documentation

## 2024-04-17 DIAGNOSIS — M25561 Pain in right knee: Secondary | ICD-10-CM | POA: Diagnosis not present

## 2024-04-17 DIAGNOSIS — G8929 Other chronic pain: Secondary | ICD-10-CM | POA: Insufficient documentation

## 2024-04-17 DIAGNOSIS — R252 Cramp and spasm: Secondary | ICD-10-CM | POA: Insufficient documentation

## 2024-04-17 DIAGNOSIS — M5441 Lumbago with sciatica, right side: Secondary | ICD-10-CM | POA: Diagnosis not present

## 2024-04-17 NOTE — Therapy (Signed)
 OUTPATIENT PHYSICAL THERAPY SHOULDER TREATMENT   Patient Name: Phillip Hughes MRN: [id number] DOB:[birth year], 68 y.o., male Today's Date: 04/17/2024  END OF SESSION:  PT End of Session - 04/17/24 0849     Visit Number 3    Date for PT Re-Evaluation 05/22/24    Progress Note Due on Visit 10    PT Start Time 0845    PT Stop Time 0928    PT Time Calculation (min) 43 min    Activity Tolerance Patient tolerated treatment well    Behavior During Therapy [facility] for tasks assessed/performed               Past Medical History:  Diagnosis Date   Hypertension    Sciatica    Sinusitis    Past Surgical History:  Procedure Laterality Date   BACK SURGERY     HERNIA REPAIR     HIP SURGERY     NECK SURGERY     Patient Active Problem List   Diagnosis Date Noted   Hx of total hip arthroplasty, right 02/28/2023   Degenerative tear of medial meniscus of right knee 12/01/2022   Lumbar radiculopathy 11/29/2022   Peroneal mononeuropathy, 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    PCP: Donnice Gale, PA  REFERRING PROVIDER: Rockford Churches, MD  REFERRING DIAG: R rotator cuff tear R AC arthritis, R subacromial bursitis  THERAPY DIAG:  Chronic right shoulder pain  Rationale for Evaluation and Treatment: Rehabilitation  ONSET DATE: approximately one year  SUBJECTIVE:                                                                                                                                                                                      SUBJECTIVE STATEMENT:  The shoulder feels better but he is having some back issues. He didn't have aching pain like he normally does   Eval: Pain chronic R shoulder.  Had injection but helped for only a couple of hours Notes worst pain with trying to sleep.  Can't sleep on R side, also notes with supine and L side lying but not as severe Hand dominance: Left  PERTINENT HISTORY: Progressive pain/ weakness R shoulder, referred by orthopedist  PAIN:  Are you  having pain? Yes: NPRS scale: 0 to6 Pain location: R lateral upper arm and R upper traps  Pain description: aches at night Aggravating factors: lifting out to side Relieving factors: position changes  PRECAUTIONS: None  RED FLAGS: None   WEIGHT BEARING RESTRICTIONS: No  FALLS:  Has patient fallen in last 6 months? No  LIVING ENVIRONMENT: Lives with: lives with their family and lives with their spouse Lives in: House/apartment Stairs: unkown Has following equipment  at home:  crutches  OCCUPATION: retired  PLOF: Independent  PATIENT GOALS:be able to sleep better  NEXT MD VISIT:   OBJECTIVE:  Note: Objective measures were completed at Evaluation unless otherwise noted.  DIAGNOSTIC FINDINGS:  MRI with R supraspinatus tear retracted 2 cm  PATIENT SURVEYS:  Quick Dash QuickDASH Score: 38.6 / 100 = 38.6 %  COGNITION: Overall cognitive status: Within functional limits for tasks assessed     SENSATION: WFL  POSTURE: Lean healthy appearance. In standing with R hip flexed 15 degrees, weight bearing on R forefoot  UPPER EXTREMITY ROM:   Active ROM Right eval Left Eval all wnl  Shoulder flexion -15   Shoulder extension    Shoulder abduction -15   Shoulder adduction    Shoulder internal rotation    Shoulder external rotation -10   Elbow flexion    Elbow extension    Wrist flexion    Wrist extension    Wrist ulnar deviation    Wrist radial deviation    Wrist pronation    Wrist supination    (Blank rows = not tested)  UPPER EXTREMITY MMT:  MMT Right eval Left Eval all wnl 04/10/24 R  Shoulder flexion 5  5  Shoulder extension     Shoulder abduction 4  5  Shoulder adduction     Shoulder internal rotation wnl    Shoulder external rotation 4  5  Middle trapezius 4 4 4   Lower trapezius     Elbow flexion     Elbow extension     Wrist flexion     Wrist extension     Wrist ulnar deviation     Wrist radial deviation     Wrist pronation     Wrist  supination     Grip strength (lbs)     (Blank rows = not tested)  SHOULDER SPECIAL TESTS: Impingement tests: Hawkins/Kennedy impingement test: positive   JOINT MOBILITY TESTING:  All wnl   PALPATION:  Fatty lipoma palpated R superior AC jt  Non tender R supraspinatus.  Mild tenderness R infraspinatus and R upper traps Some tenderness medial R scapular retractors                                                                                                                             TREATMENT DATE:  04/17/24 UBE L1.0 3 min fwd/ 3 min back Prone horizontal ABD, ER, shoulder extension 2lb weight x 15 each Review of HEP and updates made Standing R shoulder ER/IR 10x5" RTB  Seated shoulder flexion and abduction table slide Standing horizontal ABD RTB x 10 STM to R rhomboids, thoracic PS, infraspinatus 04/10/24: Manual Kinesiotaping R shoulder to improve motor recruitment R rotator cuff and assist with pain with sleep 2 -I pieces extending from ant and middle delts to upper and middle traps Supine for manual stretching while stabilizing R scapula , for R upper traps and R levator scapulae 5 bouts each Rotational  glides upper cervical spine for L and R rotation, gr 2 -3 Prone for HVLA cervicothoracic jxn B  NMR Progressed exercises:  Prone middle traps engagement with horizontal abd with R shoulder ER , 0, then 1, then 2#, total of 25 reps Prone R shoulder ER with humerus propped on table and with 2#, total of 25 reps, to fatigue, cues to avoid engaging upper traps R.   03/27/24:  Kinesiotaping R shoulder to improve motor recruitment R rotator cuff and hopefully assist with pain with sleep 2 I pieces extending from ant and middle delts to upper and middle traps Inst in theraband ER, cues for fast movement,low amplitude movement yellow Also t hand shoulder ext at door to engage scapular retractors green   PATIENT EDUCATION: Education details: POC, goals Person educated:  Patient Education method: Explanation, Demonstration, Tactile cues, Verbal cues, and Handouts Education comprehension: verbalized understanding, returned demonstration, and verbal cues required  HOME EXERCISE PROGRAM: Access Code: [email or web address] URL: [email or web address] Date: 04/17/2024 Prepared by: Alford Gamero  Exercises - Shoulder External Rotation and Scapular Retraction with Resistance  - 1 x daily - 7 x weekly - 3 sets - 10 reps - Shoulder Extension with Resistance  - 1 x daily - 7 x weekly - 3 sets - 10 reps - Prone Shoulder External Rotation  - 1 x daily - 7 x weekly - 3 sets - 10 reps - Prone Single Arm Shoulder Horizontal Abduction with Dumbbell  - 1 x daily - 7 x weekly - 3 sets - 10 reps  ASSESSMENT:  CLINICAL IMPRESSION: Patient is a 68 y.o. male who attended his 2nd physical therapy session due to R rotator cuff tear, weakness. He reports overall feeling better in R shoulder. Continued with periscapular and RTC strengthening for shoulder stability. Cues needed to avoid turning body with standing ER and IR. Cues to avoid shrugging shoulder with horizontal ABD with TB. Good response to treatment.   OBJECTIVE IMPAIRMENTS: decreased strength, impaired UE functional use, postural dysfunction, and pain.   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   REHAB POTENTIAL: Good  CLINICAL DECISION MAKING: Evolving/moderate complexity  EVALUATION COMPLEXITY: Moderate   GOALS: Goals reviewed with patient? Yes  SHORT TERM GOALS: Target date: 2 weeks 04/10/24  I HEP Baseline: Goal status: INITIAL   LONG TERM GOALS: Target date: 05/22/24  Quick dash  QuickDASH Score: 38.6 / 100 = 38.6 % improve to 24% Baseline:  Goal status: INITIAL  2.  Improve R shoulder abduction and flexion strength to 5/5  Baseline: 4/5 Goal status: INITIAL  3.  Improve sleeping interruption from R shoulder pain by 50% Baseline: currently wakes nightly Goal status: INITIAL   PLAN:  PT FREQUENCY: 1x/week  PT DURATION: 8 weeks  PLANNED INTERVENTIONS: 97110-Therapeutic exercises, 97530- Therapeutic activity, 97112- Neuromuscular re-education, 97535- Self Care, [id number]- Manual therapy, and 97033- Ionotophoresis 4mg /ml Dexamethasone   PLAN FOR NEXT SESSION: reassess strength, AROM , mechanics R shoulder modalities as needed, re tape, advance ex appropriately,, continue posterior shoulder strengthening   Samuella Crocker, PTA 04/17/2024, 9:32 AM

## 2024-04-18 ENCOUNTER — Other Ambulatory Visit (HOSPITAL_BASED_OUTPATIENT_CLINIC_OR_DEPARTMENT_OTHER): Payer: Self-pay

## 2024-04-24 ENCOUNTER — Other Ambulatory Visit: Payer: Self-pay

## 2024-04-24 ENCOUNTER — Ambulatory Visit

## 2024-04-24 DIAGNOSIS — G8929 Other chronic pain: Secondary | ICD-10-CM | POA: Diagnosis not present

## 2024-04-24 DIAGNOSIS — M25511 Pain in right shoulder: Secondary | ICD-10-CM | POA: Diagnosis not present

## 2024-04-24 DIAGNOSIS — R252 Cramp and spasm: Secondary | ICD-10-CM | POA: Diagnosis not present

## 2024-04-24 DIAGNOSIS — M5441 Lumbago with sciatica, right side: Secondary | ICD-10-CM | POA: Diagnosis not present

## 2024-04-24 DIAGNOSIS — M25561 Pain in right knee: Secondary | ICD-10-CM | POA: Diagnosis not present

## 2024-04-24 NOTE — Therapy (Signed)
 OUTPATIENT PHYSICAL THERAPY SHOULDER TREATMENT   Patient Name: Phillip Hughes MRN: [id number] DOB:06/11/1956, 68 y.o., male Today's Date: 04/24/2024  END OF SESSION:  PT End of Session - 04/24/24 0849     Visit Number 4    Date for PT Re-Evaluation 05/22/24    Progress Note Due on Visit 10    PT Start Time 0847    PT Stop Time 0930    PT Time Calculation (min) 43 min    Activity Tolerance Patient tolerated treatment well    Behavior During Therapy [facility] for tasks assessed/performed                Past Medical History:  Diagnosis Date   Hypertension    Sciatica    Sinusitis    Past Surgical History:  Procedure Laterality Date   BACK SURGERY     HERNIA REPAIR     HIP SURGERY     NECK SURGERY     Patient Active Problem List   Diagnosis Date Noted   Hx of total hip arthroplasty, right 02/28/2023   Degenerative tear of medial meniscus of right knee 12/01/2022   Lumbar radiculopathy 11/29/2022   Peroneal mononeuropathy, 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    PCP: Donnice Gale, PA  REFERRING PROVIDER: Rockford Churches, MD  REFERRING DIAG: R rotator cuff tear R AC arthritis, R subacromial bursitis  THERAPY DIAG:  Chronic right shoulder pain  Chronic right-sided low back pain with right-sided sciatica  Cramp and spasm  Chronic pain of right knee  Rationale for Evaluation and Treatment: Rehabilitation  ONSET DATE: approximately one year  SUBJECTIVE:                                                                                                                                                                                      SUBJECTIVE STATEMENT:  The shoulder feels better , sleeping better.  Eval: Pain chronic R shoulder.  Had injection but helped for only a couple of hours Notes worst pain with trying to sleep.  Can't sleep on R side, also notes with supine and L side lying but not as severe Hand dominance: Left  PERTINENT HISTORY: Progressive pain/ weakness R  shoulder, referred by orthopedist  PAIN:  Are you having pain? Yes: NPRS scale: 0 to6 Pain location: R lateral upper arm and R upper traps  Pain description: aches at night Aggravating factors: lifting out to side Relieving factors: position changes  PRECAUTIONS: None  RED FLAGS: None   WEIGHT BEARING RESTRICTIONS: No  FALLS:  Has patient fallen in last 6 months? No  LIVING ENVIRONMENT: Lives with: lives with their family and lives with their spouse Lives in:  House/apartment Stairs: unkown Has following equipment at home: crutches  OCCUPATION: retired  PLOF: Independent  PATIENT GOALS:be able to sleep better  NEXT MD VISIT:   OBJECTIVE:  Note: Objective measures were completed at Evaluation unless otherwise noted.  DIAGNOSTIC FINDINGS:  MRI with R supraspinatus tear retracted 2 cm  PATIENT SURVEYS:  Quick Dash QuickDASH Score: 38.6 / 100 = 38.6 %  COGNITION: Overall cognitive status: Within functional limits for tasks assessed     SENSATION: WFL  POSTURE: Lean healthy appearance. In standing with R hip flexed 15 degrees, weight bearing on R forefoot  UPPER EXTREMITY ROM:   Active ROM Right eval Left Eval all wnl  Shoulder flexion -15   Shoulder extension    Shoulder abduction -15   Shoulder adduction    Shoulder internal rotation    Shoulder external rotation -10   Elbow flexion    Elbow extension    Wrist flexion    Wrist extension    Wrist ulnar deviation    Wrist radial deviation    Wrist pronation    Wrist supination    (Blank rows = not tested)  UPPER EXTREMITY MMT:  MMT Right eval Left Eval all wnl 04/10/24 R  Shoulder flexion 5  5  Shoulder extension     Shoulder abduction 4  5  Shoulder adduction     Shoulder internal rotation wnl    Shoulder external rotation 4  5  Middle trapezius 4 4 4   Lower trapezius     Elbow flexion     Elbow extension     Wrist flexion     Wrist extension     Wrist ulnar deviation     Wrist  radial deviation     Wrist pronation     Wrist supination     Grip strength (lbs)     (Blank rows = not tested)  SHOULDER SPECIAL TESTS: Impingement tests: Hawkins/Kennedy impingement test: positive   JOINT MOBILITY TESTING:  All wnl   PALPATION:  Fatty lipoma palpated R superior AC jt  Non tender R supraspinatus.  Mild tenderness R infraspinatus and R upper traps Some tenderness medial R scapular retractors                                                                                                                             TREATMENT DATE:  04/24/24: UBE 3 min f, 3 min b Prone for R shoulder ext with 4# Prone R rows 3# Prone Y's no resistance Seated rows, low position 20# 2 x 15 Standing abduction stretch on ladder with distraction Standing swimmers, no wt 10 x B UE's  04/17/24 UBE L1.0 3 min fwd/ 3 min back Prone horizontal ABD, ER, shoulder extension 2lb weight x 15 each Review of HEP and updates made Standing R shoulder ER/IR 10x5" RTB  Seated shoulder flexion and abduction table slide Standing horizontal ABD RTB x 10 STM to R rhomboids, thoracic PS, infraspinatus 04/10/24:  Manual Kinesiotaping R shoulder to improve motor recruitment R rotator cuff and assist with pain with sleep 2 -I pieces extending from ant and middle delts to upper and middle traps Supine for manual stretching while stabilizing R scapula , for R upper traps and R levator scapulae 5 bouts each Rotational glides upper cervical spine for L and R rotation, gr 2 -3 Prone for HVLA cervicothoracic jxn B  NMR Progressed exercises:  Prone middle traps engagement with horizontal abd with R shoulder ER , 0, then 1, then 2#, total of 25 reps Prone R shoulder ER with humerus propped on table and with 2#, total of 25 reps, to fatigue, cues to avoid engaging upper traps R.   03/27/24:  Kinesiotaping R shoulder to improve motor recruitment R rotator cuff and hopefully assist with pain with sleep 2 I pieces  extending from ant and middle delts to upper and middle traps Inst in theraband ER, cues for fast movement,low amplitude movement yellow Also t hand shoulder ext at door to engage scapular retractors green   PATIENT EDUCATION: Education details: POC, goals Person educated: Patient Education method: Explanation, Demonstration, Tactile cues, Verbal cues, and Handouts Education comprehension: verbalized understanding, returned demonstration, and verbal cues required  HOME EXERCISE PROGRAM: Access Code: [email or web address] URL: [email or web address] Date: 04/17/2024 Prepared by: Braylin Clark  Exercises - Shoulder External Rotation and Scapular Retraction with Resistance  - 1 x daily - 7 x weekly - 3 sets - 10 reps - Shoulder Extension with Resistance  - 1 x daily - 7 x weekly - 3 sets - 10 reps - Prone Shoulder External Rotation  - 1 x daily - 7 x weekly - 3 sets - 10 reps - Prone Single Arm Shoulder Horizontal Abduction with Dumbbell  - 1 x daily - 7 x weekly - 3 sets - 10 reps  ASSESSMENT:  CLINICAL IMPRESSION: Patient is a 68 y.o. male who attended his 4th physical therapy session due to R rotator cuff tear, weakness. He reports overall feeling better in R shoulder. Continued with periscapular and RTC strengthening for shoulder stability. Added more scapular resistance ex training.  Improved control noted R shoulder.   OBJECTIVE IMPAIRMENTS: decreased strength, impaired UE functional use, postural dysfunction, and pain.   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   REHAB POTENTIAL: Good  CLINICAL DECISION MAKING: Evolving/moderate complexity  EVALUATION  COMPLEXITY: Moderate   GOALS: Goals reviewed with patient? Yes  SHORT TERM GOALS: Target date: 2 weeks 04/10/24  I HEP Baseline: Goal status: INITIAL   LONG TERM GOALS: Target date: 05/22/24  Quick dash QuickDASH Score: 38.6 / 100 = 38.6 % improve to 24% Baseline:  Goal status: INITIAL  2.  Improve R shoulder abduction and flexion strength to 5/5  Baseline: 4/5 Goal status: INITIAL  3.  Improve sleeping interruption from R shoulder pain by 50% Baseline: currently wakes nightly Goal status: INITIAL   PLAN:  PT FREQUENCY: 1x/week  PT DURATION: 8 weeks  PLANNED INTERVENTIONS: 97110-Therapeutic exercises, 97530- Therapeutic activity, [id number]- Neuromuscular re-education, 97535- Self Care, [id number]- Manual therapy, and 97033- Ionotophoresis 4mg /ml Dexamethasone   PLAN FOR NEXT SESSION: reassess strength, AROM , mechanics R shoulder modalities as needed, re tape, advance ex appropriately,, continue posterior shoulder strengthening   Sandrine Bloodsworth L Maddyson Keil,  PT, DPT, OCS 04/24/2024, 5:34 PM

## 2024-05-01 ENCOUNTER — Other Ambulatory Visit: Payer: Self-pay

## 2024-05-01 ENCOUNTER — Ambulatory Visit

## 2024-05-01 DIAGNOSIS — R252 Cramp and spasm: Secondary | ICD-10-CM | POA: Diagnosis not present

## 2024-05-01 DIAGNOSIS — M25561 Pain in right knee: Secondary | ICD-10-CM | POA: Diagnosis not present

## 2024-05-01 DIAGNOSIS — M5441 Lumbago with sciatica, right side: Secondary | ICD-10-CM | POA: Diagnosis not present

## 2024-05-01 DIAGNOSIS — G8929 Other chronic pain: Secondary | ICD-10-CM | POA: Diagnosis not present

## 2024-05-01 DIAGNOSIS — M25511 Pain in right shoulder: Secondary | ICD-10-CM | POA: Diagnosis not present

## 2024-05-01 NOTE — Therapy (Signed)
 OUTPATIENT PHYSICAL THERAPY SHOULDER TREATMENT   Patient Name: Phillip Hughes MRN: [id number] DOB:[birth year], 68 y.o., male Today's Date: 05/01/2024  END OF SESSION:  PT End of Session - 05/01/24 0849     Visit Number 5    Date for PT Re-Evaluation 05/22/24    Progress Note Due on Visit 10    PT Start Time 0847    PT Stop Time 0930    PT Time Calculation (min) 43 min    Activity Tolerance Patient tolerated treatment well    Behavior During Therapy [facility] for tasks assessed/performed                 Past Medical History:  Diagnosis Date   Hypertension    Sciatica    Sinusitis    Past Surgical History:  Procedure Laterality Date   BACK SURGERY     HERNIA REPAIR     HIP SURGERY     NECK SURGERY     Patient Active Problem List   Diagnosis Date Noted   Hx of total hip arthroplasty, right 02/28/2023   Degenerative tear of medial meniscus of right knee 12/01/2022   Lumbar radiculopathy 11/29/2022   Peroneal mononeuropathy, 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    PCP: Donnice Gale, PA  REFERRING PROVIDER: Rockford Churches, MD  REFERRING DIAG: R rotator cuff tear R AC arthritis, R subacromial bursitis  THERAPY DIAG:  Chronic right shoulder pain  Chronic right-sided low back pain with right-sided sciatica  Cramp and spasm  Chronic pain of right knee  Rationale for Evaluation and Treatment: Rehabilitation  ONSET DATE: approximately one year  SUBJECTIVE:                                                                                                                                                                                      SUBJECTIVE STATEMENT:  The shoulder feels better , sleeping better. Notice it when I first wake up to get out of bed, no longer waking me up at night  Eval: Pain chronic R shoulder.  Had injection but helped for only a couple of hours Notes worst pain with trying to sleep.  Can't sleep on R side, also notes with supine and L side lying but not as  severe Hand dominance: Left  PERTINENT HISTORY: Progressive pain/ weakness R shoulder, referred by orthopedist  PAIN:  Are you having pain? Yes: NPRS scale: 0 to6 Pain location: R lateral upper arm and R upper traps  Pain description: aches at night Aggravating factors: lifting out to side Relieving factors: position changes  PRECAUTIONS: None  RED FLAGS: None   WEIGHT BEARING RESTRICTIONS: No  FALLS:  Has patient fallen in  last 6 months? No  LIVING ENVIRONMENT: Lives with: lives with their family and lives with their spouse Lives in: House/apartment Stairs: unkown Has following equipment at home: crutches  OCCUPATION: retired  PLOF: Independent  PATIENT GOALS:be able to sleep better  NEXT MD VISIT:   OBJECTIVE:  Note: Objective measures were completed at Evaluation unless otherwise noted.  DIAGNOSTIC FINDINGS:  MRI with R supraspinatus tear retracted 2 cm  PATIENT SURVEYS:  Quick Dash QuickDASH Score: 38.6 / 100 = 38.6 %  COGNITION: Overall cognitive status: Within functional limits for tasks assessed     SENSATION: WFL  POSTURE: Lean healthy appearance. In standing with R hip flexed 15 degrees, weight bearing on R forefoot  UPPER EXTREMITY ROM:   Active ROM Right eval Left Eval all wnl  Shoulder flexion -15   Shoulder extension    Shoulder abduction -15   Shoulder adduction    Shoulder internal rotation    Shoulder external rotation -10   Elbow flexion    Elbow extension    Wrist flexion    Wrist extension    Wrist ulnar deviation    Wrist radial deviation    Wrist pronation    Wrist supination    (Blank rows = not tested)  UPPER EXTREMITY MMT:  MMT Right eval Left Eval all wnl 04/10/24 R  Shoulder flexion 5  5  Shoulder extension     Shoulder abduction 4  5  Shoulder adduction     Shoulder internal rotation wnl    Shoulder external rotation 4  5  Middle trapezius 4 4 4   Lower trapezius     Elbow flexion     Elbow extension      Wrist flexion     Wrist extension     Wrist ulnar deviation     Wrist radial deviation     Wrist pronation     Wrist supination     Grip strength (lbs)     (Blank rows = not tested)  SHOULDER SPECIAL TESTS: Impingement tests: Hawkins/Kennedy impingement test: positive   JOINT MOBILITY TESTING:  All wnl   PALPATION:  Fatty lipoma palpated R superior AC jt  Non tender R supraspinatus.  Mild tenderness R infraspinatus and R upper traps Some tenderness medial R scapular retractors                                                                                                                             TREATMENT DATE:  05/01/14: UBE 3 min f, 3 min b Seated rows, 15# x 15 x 2 Prone for R shoulder ext with 4# Prone R rows 3# Prone Y's no resistance Standing swimmers with 2# wts each hand 10 reps  Supine for manual stretching by PT R shoulder, inferior glides R humeral head, 3 bouts Rhythmic stabilization R shoulder IR/ER in supine with therapist providing manual resistance.,mass practice  Shoulder pulley x 3 min at end of session  04/24/24: UBE  3 min f, 3 min b Prone for R shoulder ext with 4# Prone R rows 3# Prone Y's no resistance Seated rows, low position 20# 2 x 15 Standing abduction stretch on ladder with distraction Standing swimmers, no wt 10 x B UE's  04/17/24 UBE L1.0 3 min fwd/ 3 min back Prone horizontal ABD, ER, shoulder extension 2lb weight x 15 each Review of HEP and updates made Standing R shoulder ER/IR 10x5" RTB  Seated shoulder flexion and abduction table slide Standing horizontal ABD RTB x 10 STM to R rhomboids, thoracic PS, infraspinatus 04/10/24: Manual Kinesiotaping R shoulder to improve motor recruitment R rotator cuff and assist with pain with sleep 2 -I pieces extending from ant and middle delts to upper and middle traps Supine for manual stretching while stabilizing R scapula , for R upper traps and R levator scapulae 5 bouts each Rotational  glides upper cervical spine for L and R rotation, gr 2 -3 Prone for HVLA cervicothoracic jxn B  NMR Progressed exercises:  Prone middle traps engagement with horizontal abd with R shoulder ER , 0, then 1, then 2#, total of 25 reps Prone R shoulder ER with humerus propped on table and with 2#, total of 25 reps, to fatigue, cues to avoid engaging upper traps R.   03/27/24:  Kinesiotaping R shoulder to improve motor recruitment R rotator cuff and hopefully assist with pain with sleep 2 I pieces extending from ant and middle delts to upper and middle traps Inst in theraband ER, cues for fast movement,low amplitude movement yellow Also t hand shoulder ext at door to engage scapular retractors green   PATIENT EDUCATION: Education details: POC, goals Person educated: Patient Education method: Explanation, Demonstration, Tactile cues, Verbal cues, and Handouts Education comprehension: verbalized understanding, returned demonstration, and verbal cues required  HOME EXERCISE PROGRAM: Access Code: [email or web address] URL: [email or web address] Date: 04/17/2024 Prepared by: Braylin Clark  Exercises - Shoulder External Rotation and Scapular Retraction with Resistance  - 1 x daily - 7 x weekly - 3 sets - 10 reps - Shoulder Extension with Resistance  - 1 x daily - 7 x weekly - 3 sets - 10 reps - Prone Shoulder External Rotation  - 1 x daily - 7 x weekly - 3 sets - 10 reps - Prone Single Arm Shoulder Horizontal Abduction with Dumbbell  - 1 x daily - 7 x weekly - 3 sets - 10 reps  ASSESSMENT:  CLINICAL IMPRESSION: Patient is a 68 y.o. male who attended his 5th physical therapy session due to R rotator cuff tear, weakness. He reports overall feeling better in R shoulder, notices when moving form supine to sitting in am with positional change.  Today MMT wfl B shoulders.  Some mechanical/ asymmetry with R shoulder abd as compared to L, improved with therex and manual stretching.   Continued with  periscapular and RTC strengthening for shoulder stability. Added more scapular resistance ex training.  Improved control noted R shoulder. At this point we determined to DC his PT for R shoulder as his goals have been met.    OBJECTIVE IMPAIRMENTS: decreased strength, impaired UE functional use, postural dysfunction, and pain.   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   REHAB POTENTIAL: Good  CLINICAL DECISION MAKING: Evolving/moderate  complexity  EVALUATION COMPLEXITY: Moderate   GOALS: Goals reviewed with patient? Yes  SHORT TERM GOALS: Target date: 2 weeks 04/10/24  I HEP Baseline: Goal status: INITIAL   LONG TERM GOALS: Target date: 05/22/24  Quick dash QuickDASH Score: 38.6 / 100 = 38.6 % improve to 24% Baseline:  Goal status: 05/01/24: QuickDASH Score: 9.1 / 100 = 9.1 %  2.  Improve R shoulder abduction and flexion strength to 5/5  Baseline: 4/5 Goal status: 05/01/24 MMT wnl   3.  Improve sleeping interruption from R shoulder pain by 50% Baseline: currently wakes nightly Goal status: 05/01/24; no sleep interruption   PLAN:  PT FREQUENCY: 1x/week  PT DURATION: 8 weeks  PLANNED INTERVENTIONS: 97110-Therapeutic exercises, 97530- Therapeutic activity, [id number]- Neuromuscular re-education, 97535- Self Care, [id number]- Manual therapy, and 97033- Ionotophoresis 4mg /ml Dexamethasone   PLAN FOR NEXT SESSION:DC   Rayhaan Huster L Marysue Fait, PT, DPT, OCS 05/01/2024, 3:27 PM

## 2024-05-11 DIAGNOSIS — M19011 Primary osteoarthritis, right shoulder: Secondary | ICD-10-CM | POA: Diagnosis not present

## 2024-05-11 DIAGNOSIS — M75121 Complete rotator cuff tear or rupture of right shoulder, not specified as traumatic: Secondary | ICD-10-CM | POA: Diagnosis not present

## 2024-05-11 DIAGNOSIS — M7551 Bursitis of right shoulder: Secondary | ICD-10-CM | POA: Diagnosis not present

## 2024-05-22 ENCOUNTER — Other Ambulatory Visit (HOSPITAL_BASED_OUTPATIENT_CLINIC_OR_DEPARTMENT_OTHER): Payer: Self-pay

## 2024-05-22 ENCOUNTER — Ambulatory Visit (INDEPENDENT_AMBULATORY_CARE_PROVIDER_SITE_OTHER): Admitting: Medical

## 2024-05-22 ENCOUNTER — Encounter: Payer: Self-pay | Admitting: Medical

## 2024-05-22 VITALS — BP 120/70 | HR 100 | Resp 18 | Ht 67.0 in | Wt 163.0 lb

## 2024-05-22 DIAGNOSIS — M25551 Pain in right hip: Secondary | ICD-10-CM

## 2024-05-22 DIAGNOSIS — I10 Essential (primary) hypertension: Secondary | ICD-10-CM

## 2024-05-22 MED ORDER — HYDROCODONE-ACETAMINOPHEN 5-325 MG PO TABS
1.0000 | ORAL_TABLET | Freq: Four times a day (QID) | ORAL | 0 refills | Status: DC | PRN
  Filled 2024-05-22: qty 20, 5d supply, fill #0

## 2024-05-22 NOTE — Progress Notes (Addendum)
 Subjective:    Patient ID: Phillip Hughes, male    DOB: [birth year], 68 y.o.   MRN: [id number]  HPI  Phillip Hughes is a 68 year old male who presents with right hip pain.  He experienced a flare-up of right hip pain two weeks ago, coinciding with rainy weather. The pain is sharp and primarily located in the right hip and thigh, becoming aggravated by movement, particularly when walking. He rates the pain as a seven out of ten, noting it was worse over the weekend. The pain is alleviated when sitting but becomes uncomfortable upon standing and walking.  He has a history of right hip replacement surgery performed in 2016. Previous x-rays in August of last year showed no loosening of the hip replacement equipment. A specialist had previously ruled out infection despite an elevated sedimentation rate, which was not considered significant at the time.  He manages his symptoms with Lyrica  200 mg twice a day and Celebrex  200 mg daily, which had been effective until the recent flare-up. He has previously used Norco for pain management without any allergic reactions. He is awaiting surgery on his shoulder, which has been delayed, and is currently not on oxycodone .     Review of Systems  Constitutional:  Negative for chills, fatigue and fever.  Respiratory:  Negative for cough, chest tightness and wheezing.   Cardiovascular:  Negative for chest pain and palpitations.  Musculoskeletal:  Negative for back pain and neck pain.       Rt hip pain 7/10.  Skin:  Negative for rash.  Neurological:  Negative for dizziness, syncope and light-headedness.  Hematological:  Negative for adenopathy. Does not bruise/bleed easily.  Psychiatric/Behavioral:  Negative for behavioral problems, decreased concentration and suicidal ideas. The patient is not nervous/anxious.    Past Medical History:  Diagnosis Date   Hypertension    Sciatica    Sinusitis      Social History   Socioeconomic History   Marital status:  Married    Spouse name: Not on file   Number of children: Not on file   Years of education: Not on file   Highest education level: 11th grade  Occupational History   Not on file  Tobacco Use   Smoking status: Former    Current packs/day: 1.00    Average packs/day: 1 pack/day for 28.0 years (28.0 ttl pk-yrs)    Types: Cigarettes   Smokeless tobacco: Never  Vaping Use   Vaping status: Never Used  Substance and Sexual Activity   Alcohol use: Yes    Comment: rarely   Drug use: No   Sexual activity: Not on file  Other Topics Concern   Not on file  Social History Narrative   Not on file   Social Drivers of Health   Financial Resource Strain: Low Risk  (02/28/2024)   Overall Financial Resource Strain (CARDIA)    Difficulty of Paying Living Expenses: Not hard at all  Food Insecurity: No Food Insecurity (02/28/2024)   Hunger Vital Sign    Worried About Running Out of Food in the Last Year: Never true    Ran Out of Food in the Last Year: Never true  Transportation Needs: No Transportation Needs (02/28/2024)   PRAPARE - Administrator, Civil Service (Medical): No    Lack of Transportation (Non-Medical): No  Physical Activity: Insufficiently Active (02/28/2024)   Exercise Vital Sign    Days of Exercise per Week: 3 days    Minutes of Exercise  per Session: 10 min  Stress: No Stress Concern Present (02/28/2024)   [employer] of Occupational Health - Occupational Stress Questionnaire    Feeling of Stress : Not at all  Social Connections: Moderately Isolated (02/28/2024)   Social Connection and Isolation Panel [NHANES]    Frequency of Communication with Friends and Family: More than three times a week    Frequency of Social Gatherings with Friends and Family: Three times a week    Attends Religious Services: Never    Active Member of Clubs or Organizations: No    Attends Banker Meetings: Not on file    Marital Status: Married  Catering manager Violence:  Not At Risk (02/28/2024)   Humiliation, Afraid, Rape, and Kick questionnaire    Fear of Current or Ex-Partner: No    Emotionally Abused: No    Physically Abused: No    Sexually Abused: No    Past Surgical History:  Procedure Laterality Date   BACK SURGERY     HERNIA REPAIR     HIP SURGERY     NECK SURGERY      No family history on file.  Allergies  Allergen Reactions   Gabapentin Other (See Comments)    Mood Swings and Depression Mood Swings and Depression Mood Swings and Depression Mood Swings and Depression    Statins Other (See Comments)    Joint pain and swelling ( updated 02/2024)    Current Outpatient Medications on File Prior to Visit  Medication Sig Dispense Refill   celecoxib  (CELEBREX ) 100 MG capsule Take 1 capsule (100 mg total) by mouth 2 (two) times daily. 60 capsule 1   celecoxib  (CELEBREX ) 200 MG capsule Take 1 capsule (200 mg total) by mouth 2 (two) times daily as needed. 60 capsule 2   diclofenac  Sodium (VOLTAREN ) 1 % GEL Apply 2 g topically 4 (four) times daily as needed. 100 g 0   hydrochlorothiazide  (HYDRODIURIL ) 25 MG tablet Take 1 tablet (25 mg total) by mouth daily. 90 tablet 3   pregabalin  (LYRICA ) 200 MG capsule Take 1 capsule (200 mg total) by mouth 2 (two) times daily. 60 capsule 3   No current facility-administered medications on file prior to visit.    Pulse 100   Resp 18   Ht 5\' 7"  (1.702 m)   Wt 163 lb (73.9 kg)   SpO2 96%   BMI 25.53 kg/m   Pulse 92 Bp 120/70 At home bp has been 130 range sytsolic at time. Other times in severe pain 150 sytolic. Pt stopped hydrochlorothiazide  due to numbnes of legs. He stopped it and symptoms resolved.       Objective:   Physical Exam  General Mental Status- Alert. General Appearance- Not in acute distress.   Skin General: Color- Normal Color. Moisture- Normal Moisture.  Neck No JVD.  Chest and Lung Exam Auscultation: Breath Sounds:-Normal.  Cardiovascular Auscultation:Rythm-  Regular. Murmurs & Other Heart Sounds:Auscultation of the heart reveals- No Murmurs.  Abdomen-soft, nt, nd, +bs. No rebound or guarding.  Neurologic Cranial Nerve exam:- CN III-XII intact(No nystagmus), symmetric smile. Strength:- 5/5 equal and symmetric strength both upper and lower extremities.    Rt hip- pain on rom. Rom limited due to pain.     Assessment & Plan:   Patient Instructions  Right hip pain Chronic right hip pain exacerbated, sharp, aggravated by movement. Differential includes mechanical issues, infection, or inflammation. - Prescribe Norco 5/325 mg for pain management. - Continue Lyrica  200 mg twice daily. - Continue  Celebrex  200 mg daily. - Order repeat x-ray of the right hip. - Order CBC and erythrocyte sedimentation rate. - Refer to orthopedic specialist for further evaluation. - Advised caution with Norco due to potential drowsiness; avoid driving or operating heavy machinery after administration.  Pending shoulder surgery Awaiting scheduling for shoulder surgery.  Htn -bp controlled despite not being on hydrochlorothiazide . BP levels vary recently with pain levels. Check bp daily and update me on level in on week.  Follow-up Follow-up needed to assess response to treatment and further evaluate hip pain. - Schedule follow-up appointment in 2-3 weeks. - Expedite specialist appointment if abnormalities are found in x-ray or labs.     Lizvet Chunn, PA-C

## 2024-05-22 NOTE — Patient Instructions (Addendum)
 Right hip pain Chronic right hip pain exacerbated, sharp, aggravated by movement. Differential includes mechanical issues, infection, or inflammation. - Prescribe Norco 5/325 mg for pain management. - Continue Lyrica  200 mg twice daily. - Continue Celebrex  200 mg daily. - Order repeat x-ray of the right hip. - Order CBC and erythrocyte sedimentation rate. - Refer to orthopedic specialist for further evaluation. - Advised caution with Norco due to potential drowsiness; avoid driving or operating heavy machinery after administration.  Pending shoulder surgery Awaiting scheduling for shoulder surgery.  Htn -bp controlled despite not being on hydrochlorothiazide . BP levels vary recently with pain levels. Check bp daily and update me on level in on week.  Follow-up Follow-up needed to assess response to treatment and further evaluate hip pain. - Schedule follow-up appointment in 2-3 weeks. - Expedite specialist appointment if abnormalities are found in x-ray or labs.

## 2024-05-22 NOTE — Addendum Note (Signed)
 Addended by: Serafina Damme on: 05/22/2024 08:33 AM   Modules accepted: Orders

## 2024-05-30 ENCOUNTER — Telehealth: Payer: Self-pay

## 2024-05-30 NOTE — Telephone Encounter (Signed)
 Please scheduled pt for Friday at 1 pm. I thought 10:20 am at first but 1 pm is better as already booked Friday morning

## 2024-05-30 NOTE — Telephone Encounter (Signed)
 Copied from CRM [id number]. Topic: Clinical - Medication Question >> May 30, 2024  4:31 PM Sophia H wrote: Reason for CRM: Patient states pain medication prescribed on 06/10 is not working (Norco 5/325 mg for pain management). Patient is also wanting to know if the hip replacement will cause inflammation since he is allergic to metal. Please advise  [phone] or [phone]

## 2024-05-31 NOTE — Telephone Encounter (Signed)
 Pt called and phone went to voicemail , pt placed at 1:00 pm slot so it wont be booked

## 2024-05-31 NOTE — Telephone Encounter (Signed)
 Pt called and lvm to return call

## 2024-06-01 ENCOUNTER — Ambulatory Visit (HOSPITAL_BASED_OUTPATIENT_CLINIC_OR_DEPARTMENT_OTHER)

## 2024-06-01 ENCOUNTER — Ambulatory Visit (HOSPITAL_BASED_OUTPATIENT_CLINIC_OR_DEPARTMENT_OTHER)
Admission: RE | Admit: 2024-06-01 | Discharge: 2024-06-01 | Disposition: A | Discharge status: Home / Self Care | Source: Ambulatory Visit | Attending: Medical | Admitting: Medical

## 2024-06-01 ENCOUNTER — Encounter (HOSPITAL_BASED_OUTPATIENT_CLINIC_OR_DEPARTMENT_OTHER): Payer: Self-pay

## 2024-06-01 ENCOUNTER — Ambulatory Visit (INDEPENDENT_AMBULATORY_CARE_PROVIDER_SITE_OTHER): Admitting: Medical

## 2024-06-01 ENCOUNTER — Other Ambulatory Visit (HOSPITAL_BASED_OUTPATIENT_CLINIC_OR_DEPARTMENT_OTHER): Payer: Self-pay

## 2024-06-01 VITALS — BP 129/73 | HR 88 | Temp 98.0°F | Resp 20 | Ht 67.0 in | Wt 169.2 lb

## 2024-06-01 DIAGNOSIS — G8929 Other chronic pain: Secondary | ICD-10-CM | POA: Insufficient documentation

## 2024-06-01 DIAGNOSIS — M25511 Pain in right shoulder: Secondary | ICD-10-CM

## 2024-06-01 DIAGNOSIS — Z96641 Presence of right artificial hip joint: Secondary | ICD-10-CM | POA: Diagnosis not present

## 2024-06-01 DIAGNOSIS — M542 Cervicalgia: Secondary | ICD-10-CM

## 2024-06-01 DIAGNOSIS — I10 Essential (primary) hypertension: Secondary | ICD-10-CM

## 2024-06-01 DIAGNOSIS — M47812 Spondylosis without myelopathy or radiculopathy, cervical region: Secondary | ICD-10-CM | POA: Diagnosis not present

## 2024-06-01 DIAGNOSIS — M25551 Pain in right hip: Secondary | ICD-10-CM | POA: Insufficient documentation

## 2024-06-01 DIAGNOSIS — Z981 Arthrodesis status: Secondary | ICD-10-CM | POA: Diagnosis not present

## 2024-06-01 LAB — CBC WITH DIFFERENTIAL/PLATELET
Absolute Lymphocytes: 2057 {cells}/uL (ref 850–3900)
Absolute Monocytes: 630 {cells}/uL (ref 200–950)
Basophils Absolute: 9 {cells}/uL (ref 0–200)
Basophils Relative: 0.2 %
Eosinophils Absolute: 171 {cells}/uL (ref 15–500)
Eosinophils Relative: 3.8 %
HCT: 37.9 % — ABNORMAL LOW (ref 38.5–50.0)
Hemoglobin: 12.4 g/dL — ABNORMAL LOW (ref 13.2–17.1)
MCH: 29 pg (ref 27.0–33.0)
MCHC: 32.7 g/dL (ref 32.0–36.0)
MCV: 88.6 fL (ref 80.0–100.0)
MPV: 9.9 fL (ref 7.5–12.5)
Monocytes Relative: 14 %
Neutro Abs: 1634 {cells}/uL (ref 1500–7800)
Neutrophils Relative %: 36.3 %
Platelets: 225 10*3/uL (ref 140–400)
RBC: 4.28 10*6/uL (ref 4.20–5.80)
RDW: 11.6 % (ref 11.0–15.0)
Total Lymphocyte: 45.7 %
WBC: 4.5 10*3/uL (ref 3.8–10.8)

## 2024-06-01 LAB — UNLABELED

## 2024-06-01 LAB — SEDIMENTATION RATE: Sed Rate: 43 mm/h — ABNORMAL HIGH (ref 0–20)

## 2024-06-01 MED ORDER — HYDROCODONE-ACETAMINOPHEN 5-325 MG PO TABS
2.0000 | ORAL_TABLET | Freq: Four times a day (QID) | ORAL | 0 refills | Status: DC | PRN
Start: 2024-06-01 — End: 2024-06-26
  Filled 2024-06-01: qty 40, 5d supply, fill #0

## 2024-06-01 NOTE — Patient Instructions (Addendum)
 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 Previous evaluations ruled out infection and prosthesis loosening. Current pain management provides limited relief. - Continue Lyrica  200 mg twice daily. - Prescribe Norco, two tablets every six hours as needed for severe pain. - Order hip X-ray to assess for changes since last evaluation. - Refer to orthopedist for reevaluation and discussion of potential metal allergy as pt expresses concern?  Shoulder Pain Rotator cuff issue requiring surgery, delayed due to scheduling backlog. Significant pain impacts sleep and daily activities. - Continue current pain management regimen with Lyrica  and Norco pending shoulder surgery.  Hypertension Blood pressure at 150/80 mmHg, potentially elevated due to pain. Previously well-controlled on HCTZ 25 mg. - Monitor blood pressure at home over the weekend and report readings via MyChart on Tuesday. - Check kidney function to assess for potential addition of antihypertensive medication.  Noted neck pain as well with rt shoulder movement. Hx of neck surgery in 2016 -get c spine xray  Follow update to be determined after lab and imaging review.

## 2024-06-01 NOTE — Telephone Encounter (Signed)
Pt came to appointment 

## 2024-06-01 NOTE — Progress Notes (Signed)
 Subjective:    Patient ID: Phillip Hughes, male    DOB: [birth year], 68 y.o.   MRN: [id number]  HPI  Phillip Hughes is a 68 year old male who presents with worsening hip and shoulder pain.  He experiences severe pain in his hip and shoulder, with the hip pain reaching a level of 'almost a ten' during certain movements, particularly when walking or stomping his foot. At rest, the pain is around a level five to seven. The hip pain has been progressively worsening over the past year, and he uses crutches to alleviate the pain when walking.  He underwent hip replacement surgery in 2016. A specialist evaluated him within the last year and found no evidence of infection, despite an elevated sed rate. 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  For his shoulder, he has significant pain due to a rotator cuff issue, which disrupts his sleep. Surgery is delayed due to scheduling backlogs.  He is currently taking Lyrica  200 mg twice daily and Norco, which initially reduced the pain but has become less effective over time. He has five tablets of Norco remaining, which he takes twice daily. No drowsiness from these medications.  No fevers, chills, or sweats. His blood pressure has been elevated, and he has a history of controlled blood pressure on HCTZ 25 mg.       Review of Systems  Constitutional:  Negative for chills, fatigue and fever.  Respiratory:  Negative for cough, chest tightness and wheezing.   Cardiovascular:  Negative for chest pain and palpitations.  Gastrointestinal:  Negative for abdominal pain, blood in stool and diarrhea.  Genitourinary:  Negative for dysuria and frequency.  Musculoskeletal:  Positive for neck pain.       Rt shoulder and rt hip pain  Neurological:  Negative for dizziness, seizures, weakness and headaches.  Hematological:  Negative for adenopathy. Does  not bruise/bleed easily.  Psychiatric/Behavioral:  Negative for behavioral problems and decreased concentration.     Past Medical History:  Diagnosis Date   Hypertension    Sciatica    Sinusitis      Social History   Socioeconomic History   Marital status: Married    Spouse name: Not on file   Number of children: Not on file   Years of education: Not on file   Highest education level: 11th grade  Occupational History   Not on file  Tobacco Use   Smoking status: Former    Current packs/day: 1.00    Average packs/day: 1 pack/day for 28.0 years (28.0 ttl pk-yrs)    Types: Cigarettes   Smokeless tobacco: Never  Vaping Use   Vaping status: Never Used  Substance and Sexual Activity   Alcohol use: Yes    Comment: rarely   Drug use: No   Sexual activity: Not on file  Other Topics Concern   Not on file  Social History Narrative   Not on file   Social Drivers of Health   Financial Resource Strain: Low Risk  (06/01/2024)   Overall Financial Resource Strain (CARDIA)    Difficulty of Paying Living Expenses: Not hard at all  Food Insecurity: No Food Insecurity (06/01/2024)   Hunger Vital Sign    Worried About Running Out of Food in the Last Year: Never true    Hughes Out of Food in the Last Year: Never true  Transportation Needs:  No Transportation Needs (06/01/2024)   PRAPARE - Administrator, Civil Service (Medical): No    Lack of Transportation (Non-Medical): No  Physical Activity: Insufficiently Active (06/01/2024)   Exercise Vital Sign    Days of Exercise per Week: 2 days    Minutes of Exercise per Session: 30 min  Stress: No Stress Concern Present (06/01/2024)   [employer] of Occupational Health - Occupational Stress Questionnaire    Feeling of Stress: Not at all  Social Connections: Moderately Isolated (06/01/2024)   Social Connection and Isolation Panel    Frequency of Communication with Friends and Family: More than three times a week    Frequency of  Social Gatherings with Friends and Family: Not on file    Attends Religious Services: Never    Database administrator or Organizations: No    Attends Banker Meetings: Not on file    Marital Status: Married  Intimate Partner Violence: Not At Risk (02/28/2024)   Humiliation, Afraid, Rape, and Kick questionnaire    Fear of Current or Ex-Partner: No    Emotionally Abused: No    Physically Abused: No    Sexually Abused: No    Past Surgical History:  Procedure Laterality Date   BACK SURGERY     HERNIA REPAIR     HIP SURGERY     NECK SURGERY      No family history on file.  Allergies  Allergen Reactions   Gabapentin Other (See Comments)    Mood Swings and Depression Mood Swings and Depression Mood Swings and Depression Mood Swings and Depression    Statins Other (See Comments)    Joint pain and swelling ( updated 02/2024)    Current Outpatient Medications on File Prior to Visit  Medication Sig Dispense Refill   celecoxib  (CELEBREX ) 100 MG capsule Take 1 capsule (100 mg total) by mouth 2 (two) times daily. 60 capsule 1   celecoxib  (CELEBREX ) 200 MG capsule Take 1 capsule (200 mg total) by mouth 2 (two) times daily as needed. 60 capsule 2   diclofenac  Sodium (VOLTAREN ) 1 % GEL Apply 2 g topically 4 (four) times daily as needed. 100 g 0   hydrochlorothiazide  (HYDRODIURIL ) 25 MG tablet Take 1 tablet (25 mg total) by mouth daily. 90 tablet 3   HYDROcodone -acetaminophen  (NORCO/VICODIN) 5-325 MG tablet Take 1 tablet by mouth every 6 (six) hours as needed for moderate pain (pain score 4-6). 20 tablet 0   pregabalin  (LYRICA ) 200 MG capsule Take 1 capsule (200 mg total) by mouth 2 (two) times daily. 60 capsule 3   No current facility-administered medications on file prior to visit.    BP (!) 150/80   Pulse 88   Temp 98 F (36.7 C)   Resp 20   Ht 5' 7 (1.702 m)   Wt 169 lb 3.2 oz (76.7 kg)   SpO2 94%   BMI 26.50 kg/m        Objective:   Physical  Exam  General Mental Status- Alert. General Appearance- Not in acute distress.   Skin General: Color- Normal Color. Moisture- Normal Moisture.  Neck No JVD. Rt trapezius tender. But no mid c spine tendernss.  Chest and Lung Exam Auscultation: Breath Sounds:-Normal.  Cardiovascular Auscultation:Rythm- Regular. Murmurs & Other Heart Sounds:Auscultation of the heart reveals- No Murmurs.  Abdomen Inspection:-Inspeection Normal. Palpation/Percussion:Note:No mass. Palpation and Percussion of the abdomen reveal- Non Tender, Non Distended + BS, no rebound or guarding.   Neurologic Cranial Nerve exam:-  CN III-XII intact(No nystagmus), symmetric smile. Strength:- 5/5 equal and symmetric strength both upper and lower extremities.   Rt hip- pain on range of motion. Very limited/ Rt shoulder- pain on attempted abduction.      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 Previous evaluations ruled out infection and prosthesis loosening. Current pain management provides limited relief. - Continue Lyrica  200 mg twice daily. - Prescribe Norco, two tablets every six hours as needed for severe pain. - Order hip X-ray to assess for changes since last evaluation. - Refer to orthopedist for reevaluation and discussion of potential metal allergy.  Shoulder Pain Rotator cuff issue requiring surgery, delayed due to scheduling backlog. Significant pain impacts sleep and daily activities. - Continue current pain management regimen with Lyrica  and Norco pending shoulder surgery.  Hypertension Blood pressure at 150/80 mmHg, potentially elevated due to pain. Previously well-controlled on HCTZ 25 mg. - Monitor blood pressure at home over the weekend and report readings via MyChart on Tuesday. - Check kidney function to assess for potential addition of antihypertensive medication.  Noted  neck pain as well with rt shoulder movement. Hx of neck surgery in 2016 -get c spine xray  Follow update to be determined after lab and imaging review.   Mariel Gaudin, PA-C

## 2024-06-01 NOTE — Telephone Encounter (Signed)
 Pt called and voicemail full

## 2024-06-01 NOTE — Telephone Encounter (Signed)
 Pt called and lvm to return call, if pt doesn't call back will cancel 1 pm appointment for today

## 2024-06-01 NOTE — Addendum Note (Signed)
 Addended by: Marigene Shoulder on: 06/01/2024 01:57 PM   Modules accepted: Orders

## 2024-06-02 ENCOUNTER — Ambulatory Visit: Payer: Self-pay | Admitting: Medical

## 2024-06-02 NOTE — Addendum Note (Signed)
 Addended by: DORINA DALLAS HERO on: 06/02/2024 04:38 AM   Modules accepted: Orders

## 2024-06-04 ENCOUNTER — Telehealth: Payer: Self-pay

## 2024-06-04 NOTE — Telephone Encounter (Signed)
 Copied from CRM [id number]. Topic: General - Other >> Jun 04, 2024  9:20 AM Macario HERO wrote: Reason for CRM: Corean from [facility] -  Received sample June 20th and there were two tubes but one without a name. They are calling to ask what to do with the other sample with no name. Call back: [phone] -  reference number:  [id number]

## 2024-06-04 NOTE — Telephone Encounter (Signed)
 Spoke with Cat H at [facility] and advised that tube is not needed and can toss tube.

## 2024-06-04 NOTE — Addendum Note (Signed)
 Addended by: ESTELLE GILLIS D on: 06/04/2024 09:43 AM   Modules accepted: Orders

## 2024-06-11 ENCOUNTER — Other Ambulatory Visit: Payer: Self-pay | Admitting: Medical

## 2024-06-11 NOTE — Telephone Encounter (Signed)
 Copied from CRM [id number]. Topic: Clinical - Medication Refill >> Jun 11, 2024 11:35 AM Rosina BIRCH wrote: Medication: celecoxib  (CELEBREX ) 200 MG capsule  Has the patient contacted their pharmacy? No (Agent: If no, request that the patient contact the pharmacy for the refill. If patient does not wish to contact the pharmacy document the reason why and proceed with request.) (Agent: If yes, when and what did the pharmacy advise?)  This is the patient's preferred pharmacy:  [facility] [address] [place] [address] Phone: [phone] Fax: [phone]  Is this the correct pharmacy for this prescription? Yes If no, delete pharmacy and type the correct one.   Has the prescription been filled recently? Yes  Is the patient out of the medication? Yes  Has the patient been seen for an appointment in the last year OR does the patient have an upcoming appointment? Yes  Can we respond through MyChart? Yes  Agent: Please be advised that Rx refills may take up to 3 business days. We ask that you follow-up with your pharmacy.

## 2024-06-12 ENCOUNTER — Ambulatory Visit: Admitting: Medical

## 2024-06-12 ENCOUNTER — Other Ambulatory Visit (HOSPITAL_BASED_OUTPATIENT_CLINIC_OR_DEPARTMENT_OTHER): Payer: Self-pay

## 2024-06-12 MED ORDER — CELECOXIB 100 MG PO CAPS
100.0000 mg | ORAL_CAPSULE | Freq: Two times a day (BID) | ORAL | 0 refills | Status: DC
  Filled 2024-06-12: qty 20, 10d supply, fill #0

## 2024-06-12 NOTE — Addendum Note (Signed)
 Addended by: DORINA DALLAS HERO on: 06/12/2024 06:04 AM   Modules accepted: Orders

## 2024-06-13 ENCOUNTER — Other Ambulatory Visit (HOSPITAL_BASED_OUTPATIENT_CLINIC_OR_DEPARTMENT_OTHER): Payer: Self-pay

## 2024-06-21 ENCOUNTER — Telehealth: Payer: Self-pay | Admitting: Medical

## 2024-06-21 NOTE — Telephone Encounter (Signed)
 Form in PCP red folder.

## 2024-06-21 NOTE — Telephone Encounter (Signed)
 Copied from CRM [id number]. Topic: General - Other >> Jun 21, 2024 11:06 AM Mia F wrote: Reason for CRM: Pt called stating he has a form that need to be completed for his insurance company in order for him to have surgery. Pt will have form dropped off

## 2024-06-21 NOTE — Telephone Encounter (Signed)
 Pt dropped off forms to be completed by pcp. Pt wants to pick up forms at appt next week.,

## 2024-06-21 NOTE — Telephone Encounter (Signed)
 Noted

## 2024-06-25 ENCOUNTER — Ambulatory Visit: Admitting: Orthopaedic Surgery

## 2024-06-25 ENCOUNTER — Telehealth: Payer: Self-pay

## 2024-06-25 DIAGNOSIS — M25551 Pain in right hip: Secondary | ICD-10-CM

## 2024-06-25 DIAGNOSIS — Z96641 Presence of right artificial hip joint: Secondary | ICD-10-CM

## 2024-06-25 NOTE — Progress Notes (Signed)
 The patient is a 68 year old gentleman that sent to me for second opinion as a relates to chronic right hip pain.  He actually had a hip replacement through a posterior approach back in 2016.  This was performed in [facility].  He also has had back surgery at L4-L5.  He does ambulate with a cane.  He reports chronic back pain and chronic right hip pain but also radiating pain down his right leg.  He has been on chronic pain medication in the past as well.  I could see from all the imaging studies that he has had a aspiration of his right hip rule out infection which was negative.  In 2024 he did have a three-phase bone scan that was negative.  He does have plain films that suggest the potential for loosening but I have seen this before with this type of implant and it ends up not being loose in terms of where the implant is actually bone ingrown.  On exam he has a well-healed posterior hip incision at the right hip.  He does not tolerate us  trying to put his hip through range of motion.  It is difficult to tell the source of his pain.  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  He says he has had injections in his back as well.  I did look at the imaging studies as far as the bone scan and recent plain films of his pelvis and right hip.  I see the cortical irregularities at the radiologist pointing to which can suggest prosthetic loosening or infection.  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  From my standpoint we can at least send him for MRI of his right hip to assess for any type of fluid collections or any other evidence that the replacement of a loose.  He is requesting pain medications but I did let him know this is a chronic issue and that we would not put him on any type of pain medication.   We will see him back with follow-up after the MRI.  I believe that this is a chronic pain issue that most likely is contributed from his back and his hip but again previous studies have shown no evidence of infection of the hip on aspiration and the three-phase bone scan was normal with no uptake.

## 2024-06-25 NOTE — Telephone Encounter (Signed)
 Patient wants to move forward with MRI, patient will call once he decides which facility he wants to go to. Pending...[phone]

## 2024-06-26 ENCOUNTER — Ambulatory Visit: Payer: Self-pay | Admitting: Medical

## 2024-06-26 ENCOUNTER — Other Ambulatory Visit (HOSPITAL_BASED_OUTPATIENT_CLINIC_OR_DEPARTMENT_OTHER): Payer: Self-pay

## 2024-06-26 ENCOUNTER — Telehealth: Payer: Self-pay | Admitting: Orthopaedic Surgery

## 2024-06-26 ENCOUNTER — Ambulatory Visit (INDEPENDENT_AMBULATORY_CARE_PROVIDER_SITE_OTHER): Admitting: Medical

## 2024-06-26 ENCOUNTER — Other Ambulatory Visit: Payer: Self-pay

## 2024-06-26 ENCOUNTER — Encounter: Payer: Self-pay | Admitting: Medical

## 2024-06-26 VITALS — BP 140/80 | HR 81 | Temp 98.1°F | Resp 18 | Ht 67.0 in | Wt 165.0 lb

## 2024-06-26 DIAGNOSIS — M25551 Pain in right hip: Secondary | ICD-10-CM

## 2024-06-26 DIAGNOSIS — M542 Cervicalgia: Secondary | ICD-10-CM

## 2024-06-26 DIAGNOSIS — M5416 Radiculopathy, lumbar region: Secondary | ICD-10-CM

## 2024-06-26 DIAGNOSIS — I10 Essential (primary) hypertension: Secondary | ICD-10-CM

## 2024-06-26 DIAGNOSIS — M75101 Unspecified rotator cuff tear or rupture of right shoulder, not specified as traumatic: Secondary | ICD-10-CM

## 2024-06-26 DIAGNOSIS — Z96641 Presence of right artificial hip joint: Secondary | ICD-10-CM

## 2024-06-26 LAB — COMPREHENSIVE METABOLIC PANEL WITH GFR
ALT: 25 U/L (ref 0–53)
AST: 26 U/L (ref 0–37)
Albumin: 4.4 g/dL (ref 3.5–5.2)
Alkaline Phosphatase: 74 U/L (ref 39–117)
BUN: 18 mg/dL (ref 6–23)
CO2: 30 meq/L (ref 19–32)
Calcium: 9.9 mg/dL (ref 8.4–10.5)
Chloride: 106 meq/L (ref 96–112)
Creatinine, Ser: 1.07 mg/dL (ref 0.40–1.50)
GFR: 70.88 mL/min (ref >60.00)
Glucose, Bld: 89 mg/dL (ref 70–99)
Potassium: 5.1 meq/L (ref 3.5–5.1)
Sodium: 141 meq/L (ref 135–145)
Total Bilirubin: 0.4 mg/dL (ref 0.2–1.2)
Total Protein: 7.9 g/dL (ref 6.0–8.3)

## 2024-06-26 MED ORDER — HYDROCODONE-ACETAMINOPHEN 5-325 MG PO TABS
2.0000 | ORAL_TABLET | Freq: Four times a day (QID) | ORAL | 0 refills | Status: DC | PRN
  Filled 2024-06-26: qty 40, 5d supply, fill #0

## 2024-06-26 NOTE — Patient Instructions (Signed)
 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 Orthopedist suspects back involvement. Cost concerns for multiple MRIs; suggested starting with hip MRI. - Order MRI of the right hip. - Consider further imaging if hip MRI is inconclusive.  Lumbar Radiculopathy Chronic radiculopathy with right leg pain post L4-L5 surgery. Previous injections ineffective. Financial constraints affect pain management. - Refer to neurosurgeon for evaluation of lumbar radiculopathy. - Prescribe Norco for pain management.  Cervical Spine Pain Pain possibly related to 2016 neck surgery, radiating to shoulder. Planned neurosurgery referral. - Refer to neurosurgeon for evaluation of cervical spine pain.  Right Shoulder Pain(rotator cuff tear) Scheduled for surgery on July 24th for torn rotator cuff and bursitis. Pre-surgical clearance may be needed. - Complete pre-surgical clearance if required. But note I am not in town next week. GLENWOOD Mazzoni out home health certification form for post-surgery care.  Hypertension Managed with HCTZ  daily. Elevated due to pain. Monitoring kidney function and potassium levels. Consider losartan if persistent elevation. - Check metabolic panel including kidney function and potassium levels. - Monitor blood pressure at home. - Consider adding losartan if blood pressure remains elevated.  Follow-up Contingent on lab results and specialist notes. Advised daily blood pressure monitoring. - Review lab results and specialist notes to determine follow-up date.

## 2024-06-26 NOTE — Progress Notes (Signed)
 Subjective:    Patient ID: Phillip Hughes, male    DOB: 07/20/1956, 68 y.o.   MRN: [id number]  HPI  Phillip Hughes is a 68 year old male who presents with right hip and leg pain.  He experiences right hip pain, which he believes may be related to his back issues. He reports that his orthopedist discussed the possibility of loosening of the hip replacement and recommended an MRI to investigate further. He also notes that his back pain may be contributing to the hip pain.  He has a history of lumbar spine issues, having undergone lumbar surgery on L4 and L5 in February 2017. He received injections in his back recently, which did not alleviate his symptoms. He recalls having an MRI of his lumbar spine two years ago, but due to a change in insurance, he is unable to access those records. He experiences pain radiating from his back down his right leg, which is exacerbated by walking.  He also reports neck pain, which began after his shoulder issues started. He underwent neck surgery in December 2016. The pain radiates from behind his neck and is associated with his shoulder pain. He is scheduled for right shoulder surgery on the  July 24th of this month due to a rotator cuff tear and bursitis.  He is currently taking Norco for pain, using two tablets in the morning and two at night, as he finds this regimen more effective than the prescribed every six hours as needed. He has run out of this medication and experiences sharp pains in his leg, which his wife has noticed.  He has a history of hypertension and is on a diuretic, 25 mg daily. His blood pressure is higher when he is in pain, reaching around 150/70, but is lower when not in pain, ranging from 140 to 129 systolic. No cramping.    Review of Systems  Constitutional:  Negative for chills and fatigue.  HENT:  Negative for congestion, ear pain and facial swelling.   Respiratory:  Negative for chest tightness, shortness of breath and wheezing.    Cardiovascular:  Negative for chest pain and palpitations.  Gastrointestinal:  Negative for abdominal pain.  Musculoskeletal:  Positive for back pain and neck pain.       Rt shoulder pain. Rt hip pain.  Neurological:  Negative for dizziness and numbness.  Hematological:  Negative for adenopathy. Does not bruise/bleed easily.  Psychiatric/Behavioral:  Negative for behavioral problems and decreased concentration. The patient is not nervous/anxious.    See hpi    Objective:   Physical Exam  General Mental Status- Alert. General Appearance- Not in acute distress.   Skin General: Color- Normal Color. Moisture- Normal Moisture.  Neck Carotid Arteries- Normal color. Moisture- Normal Moisture. No carotid bruits. No JVD.  Chest and Lung Exam Auscultation: Breath Sounds:-Normal.  Cardiovascular Auscultation:Rythm- Regular. Murmurs & Other Heart Sounds:Auscultation of the heart reveals- No Murmurs.  Abdomen Inspection:-Inspeection Normal. Palpation/Percussion:Note:No mass. Palpation and Percussion of the abdomen reveal- Non Tender, Non Distended + BS, no rebound or guarding.   Neurologic Cranial Nerve exam:- CN III-XII intact(No nystagmus), symmetric smile. Strength:- 5/5 equal and symmetric strength both upper and lower extremities.       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 Orthopedist suspects back involvement. Cost concerns for multiple MRIs; suggested starting with hip MRI. - Order MRI of the right hip. - Consider further imaging if hip MRI is  inconclusive.  Lumbar Radiculopathy Chronic radiculopathy with right leg pain post L4-L5 surgery. Previous injections ineffective. Financial constraints affect pain management. - Refer to neurosurgeon for evaluation of lumbar radiculopathy. - Prescribe Norco for pain management.  Cervical Spine Pain Pain possibly related to 2016 neck surgery,  radiating to shoulder. Planned neurosurgery referral. - Refer to neurosurgeon for evaluation of cervical spine pain.  Right Shoulder Pain(rotator cuff tear) Scheduled for surgery on July 24th for torn rotator cuff and bursitis. Pre-surgical clearance may be needed. - Complete pre-surgical clearance if required. But note I am not in town next week. GLENWOOD Mazzoni out home health certification form for post-surgery care.  Hypertension Managed with HCTZ  daily. Elevated due to pain. Monitoring kidney function and potassium levels. Consider losartan if persistent elevation. - Check metabolic panel including kidney function and potassium levels. - Monitor blood pressure at home. - Consider adding losartan if blood pressure remains elevated.  Follow-up Contingent on lab results and specialist notes. Advised daily blood pressure monitoring. - Review lab results and specialist notes to determine follow-up date.   Katlynn Naser, PA-C   Time spent with patient today was 40  minutes which consisted of chart revdiew, discussing diagnosis, work up treatment and documentation.

## 2024-06-26 NOTE — Telephone Encounter (Signed)
 Pt called and stated [facility] sent in referral for MRI. Mri facility is out of network with his insurance. Pt is asking for referral to go to [facility] [address] [place] [address]. Pt will pick up disk after MRI appt and bring with him. Pt phone number is [phone].

## 2024-07-02 ENCOUNTER — Telehealth: Payer: Self-pay

## 2024-07-02 NOTE — Telephone Encounter (Signed)
 With the referral still being open... he can call and r/s correct?    Copied from CRM [id number]. Topic: Referral - Question >> Jul 02, 2024  2:40 PM Aisha D wrote: Reason for CRM: Pt stated that he has an appt scheduled today for a spine provider. Pt stated that he was unable to see the provider because he went to the wrong floor and didn't know what provider he was seeing. Pt would like another referral for spine but somewhere that's local and located in [place]. Pt would like a callback with a update regarding this request.    ----------------------------------------------------------------------- From previous Reason for Contact - Other: Reason for CRM:

## 2024-07-03 DIAGNOSIS — M75121 Complete rotator cuff tear or rupture of right shoulder, not specified as traumatic: Secondary | ICD-10-CM | POA: Diagnosis not present

## 2024-07-03 NOTE — Telephone Encounter (Signed)
 Pt stated he wants an office in [place] somewhere locally

## 2024-07-03 NOTE — Telephone Encounter (Signed)
 Pt wants [address] office

## 2024-07-04 ENCOUNTER — Telehealth: Payer: Self-pay | Admitting: Medical

## 2024-07-04 NOTE — Telephone Encounter (Signed)
 Copied from CRM [id number]. Topic: General - Other >> Jul 04, 2024  8:23 AM Turkey A wrote: Reason for CRM: Phillip Hughes with [facility]  Surgeon office called and she needs a correct referral fax for the patient . Her direct line is [phone]. The referall also has different patient also on referral MRN [id number] sent to incorrect fax. Should be sent to [facility] Neuro office number is [phone].

## 2024-07-04 NOTE — Telephone Encounter (Signed)
 Spoke to Phillip Hughes to get some clarification on her message. Explained that there were two different faxes sent to them for different patients, which she stated that she must have thought it was only one fax and now she saw it was in fact two different orders. Clarified that the order for this patient needed to be faxed to another [facility] office which has been done. No further action needed.

## 2024-07-04 NOTE — Therapy (Addendum)
 OUTPATIENT PHYSICAL THERAPY SHOULDER EVALUATION   Patient Name: Phillip Hughes MRN: [id number] DOB:November 06, 1956, 68 y.o., male Today's Date: 07/10/2024   END OF SESSION:  PT End of Session - 07/10/24 0857     Visit Number 1    Date for PT Re-Evaluation 10/30/24    Authorization Type [employer] MCR    PT Start Time 0856    PT Stop Time 0934    PT Time Calculation (min) 38 min    Activity Tolerance Patient tolerated treatment well    Behavior During Therapy WFL for tasks assessed/performed          Past Medical History:  Diagnosis Date   Hypertension    Sciatica    Sinusitis    Past Surgical History:  Procedure Laterality Date   BACK SURGERY     HERNIA REPAIR     HIP SURGERY     NECK SURGERY     Patient Active Problem List   Diagnosis Date Noted   Hx of total hip arthroplasty, right 02/28/2023   Degenerative tear of medial meniscus of right knee 12/01/2022   Lumbar radiculopathy 11/29/2022   Peroneal mononeuropathy, 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    PCP: Dorina Loving, PA-C   REFERRING PROVIDER: Duwaine Ozell PARAS., MD   REFERRING DIAG: Tear of R rotator cuff, unspecified tear extent, unspecified whether traumatic (M75.101); 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); Subacromial bursitis R shoulder (M75.51); Chronic R shoulder pain (M25.511, G89.29)  THERAPY DIAG:  Acute pain of right shoulder  Muscle weakness (generalized)  Stiffness of right shoulder, not elsewhere classified  RATIONALE FOR EVALUATION AND TREATMENT: Rehabilitation  ONSET DATE: 07[birth year]05/[birth year]  NEXT MD VISIT: 07/20/24   SUBJECTIVE:                                                                                                                                                                                                         SUBJECTIVE STATEMENT:  Patient is referred to PT following a R shoulder RTC repair with SAD/DCR by Dr Duwaine on 07/05/24.  States he tried PT prior to surgery, but it was unsuccessful.   He states he is  feeling pretty good for post op day 5, and having a lot less pain than what he expected.  He has a post op bandage in place over the R shoulder incision(s) of gauze/tegaderm that is intact with no drainage/redness/bruising.  He is wearing his Donjoy abduction sling at all times per his report.  States only removes the sling to wash under his arm and to don/doff clothing.  Spouse is assisting him  with dressing upper body and with don/doffing the Donjoy sling.   Patient is able to teach back the importance of no active ROM of the R shoulder and no raising the RUE away from his body.  states he is following this precaution strictly.  Prior to surgery he was fairly active, but limited by RLE pain/weakness from back problems.   He ambulates with a cane at his baseline and comes to clinic using cane in the LUE.   He understands to maintain NWB RUE at this time.   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  He understands recovery from RTC surgery can take a long time he is willing to follow the post op protocol.   PAIN: Are you having pain? Yes: NPRS scale: 4/10 now and worst Pain location: R shoulder Pain description: aching, sore Aggravating factors: any movements Relieving factors: rest, ice, meds  PERTINENT HISTORY:  R THA, Back surgery 2017, lumbar radiculopathy, Neck surgery, HTN  PRECAUTIONS: Shoulder RTC protocol precautions for Dr Duwaine  RED FLAGS: None  HAND DOMINANCE: Left  WEIGHT BEARING RESTRICTIONS: Yes NWB RUE  FALLS:  Has patient fallen in last 6 months? 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;  would like to return to playing golf (hasn't done since 2023)   OBJECTIVE: (objective measures completed at initial evaluation unless otherwise dated)  PATIENT SURVEYS:  Quick Dash = 81.8/100  COGNITION: Overall cognitive status: Within functional limits for tasks assessed     SENSATION: WFL  POSTURE: No Significant postural limitations  UPPER EXTREMITY ROM:   Passive ROM Right eval Left eval  Shoulder flexion 75 180  Shoulder extension NT 55  Shoulder abduction  175  Shoulder adduction    Shoulder internal rotation Hand to stomach in sling 75  Shoulder external rotation 10 80  Elbow flexion WNL = BUE   Elbow extension    Wrist flexion    Wrist extension    Wrist ulnar deviation    Wrist radial deviation    Wrist pronation    Wrist supination    (Blank rows = not tested)  UPPER EXTREMITY MMT: 07/10/24--No strength testing done at eval due to surgical precautions  MMT Right eval Left eval  Shoulder flexion    Shoulder extension    Shoulder abduction    Shoulder adduction    Shoulder internal rotation    Shoulder external rotation    Middle trapezius    Lower trapezius    Elbow flexion    Elbow extension    Wrist flexion    Wrist extension    Wrist ulnar deviation    Wrist radial deviation    Wrist pronation    Wrist supination    Grip strength (lbs)    (Blank rows = not tested)  SHOULDER SPECIAL TESTS:  N/A   JOINT MOBILITY TESTING:  N/A  PALPATION:  TTP over the anterior lateral deltoid area and upper traps    TODAY'S TREATMENT:   SELF CARE: Provided education on strategies for edema management and how to don/doff sling with assistance; using pillow under RUE when lying on back for support and extension prevention; icing  15-20 min every 2 hrs; how to safely put on shirts without lifting RUE.  PATIENT EDUCATION:  Education details: PT eval findings, anticipated POC, and initial HEP  Person educated: Patient Education method:  Explanation, Demonstration, Verbal cues, Tactile cues, and Handouts Education comprehension: verbalized understanding, verbal cues required, tactile cues required, and needs further education  HOME EXERCISE PROGRAM: Access Code: [email or web address] URL: [email or web address] Date: 07/10/2024 Prepared by: Garnette Montclair  Exercises - Seated Shoulder Shrugs  - 1 x daily - 7 x weekly - 3 sets - 10 reps - Seated Shoulder Shrug Circles AROM Forward  - 1 x daily - 7 x weekly - 3 sets - 10 reps - Seated Elbow Flexion AAROM  - 1 x daily - 7 x weekly - 3 sets - 10 reps - Forearm Pronation and Supination With Shoulder Sling  - 1 x daily - 7 x weekly - 3 sets - 10 reps - Wrist Flexion and Extension With Shoulder Sling  - 1 x daily - 7 x weekly - 3 sets - 10 reps - Ball Squeeze With Shoulder Sling  - 1 x daily - 7 x weekly - 3 sets - 10 reps   ASSESSMENT:  CLINICAL IMPRESSION: Phillip Hughes is a 68 y.o. male who was referred to physical therapy for evaluation and treatment following a R RTC repair with SAD/DCR by Dr Duwaine on 07/05/24.  He is in protective phase of recovery and is educated on the importance of no AROM R shoulder, NWB RUE, no submerging or getting incision wet. His pain level/control is excellent for early post op phase.   He has minimal edema in the RUE. He reports bowels are moving normally despite opiod use.   He tolerates PROM of the R shoulder very well today.  Patient has deficits in R shoulder ROM, R shoulder flexibility, R shoulder strength, difficulty with don/doff sling which are interfering with ADLs and are impacting quality of life.  On QuickDASH patient scored 81.8/100 demonstrating 81.8% disability.  Sven will benefit from skilled PT to address above deficits to improve mobility and activity tolerance with decreased pain interference.   OBJECTIVE IMPAIRMENTS: decreased ROM, decreased strength, decreased safety awareness, increased edema, increased muscle spasms,  impaired flexibility, impaired UE functional use, and pain.   ACTIVITY LIMITATIONS: carrying, lifting, sleeping, bathing, toileting, dressing, self feeding, reach over head, and hygiene/grooming  PARTICIPATION LIMITATIONS: meal prep, cleaning, laundry, driving, and shopping  PERSONAL FACTORS: Age and 1-2 comorbidities: HTN, sciatica, lumbar radiculopathy and chronic back pain are also affecting patient's functional outcome.   REHAB POTENTIAL: Good  CLINICAL DECISION MAKING: Evolving/moderate complexity  EVALUATION COMPLEXITY: Moderate   GOALS: Goals reviewed with patient? Yes  SHORT TERM GOALS: Target date: 09/04/2024  Patient will be independent with initial HEP to improve outcomes and carryover.  Baseline: 100% PT assist required for correct completion Goal status: INITIAL  2.  Patient will report 25% improvement in R shoulder pain to improve QOL.   Baseline: 4/10  Goal status: INITIAL  3.  Patient will demonstrate R shoulder ROM (within protocol limitations) of 140 degrees flexion/scaption/abduction, 40 degrees ER, and IR to R buttock Baseline: see ROM table above Goal status: INITIAL  4.  Patient will demonstrate 3+/5 R shoulder strength for ability to do haircare, self hygiene, reaching above head  Baseline:  strength NT due to surgical precautions  Goal Status:  INITIAL LONG TERM GOALS: Target date: .wplus1  Patient will be independent with ongoing/advanced HEP for self-management at home.  Baseline:  no advanced HEP Yet Goal status: INITIAL  2.  Patient will report 50-75% improvement in R shoulder pain to improve QOL.  Baseline: 4/10 Goal status: INITIAL  4.  Patient to improve R shoulder AROM to WNL without pain provocation to allow for increased ease of ADLs.  Baseline: Refer to above UE ROM table Goal status: INITIAL  5.  Patient will demonstrate improved R shoulder strength to >/= 5/5 for functional UE use. Baseline: Refer to above UE MMT table Goal status:  INITIAL  6  Patient will report </= 40 % on QuickDASH (MCID = 14%) to demonstrate improved functional ability.  Baseline: 81.8% Goal status: INITIAL  7.  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   Baseline: not allowed due to surgical restrictions Goal status: INITIAL   PLAN:  PT FREQUENCY: 1-2x/week  PT DURATION: other: 16 weeks  PLANNED INTERVENTIONS: 97164- PT Re-evaluation, 97750- Physical Performance Testing, 97110-Therapeutic exercises, 97530- Therapeutic activity, [id number]- Neuromuscular re-education, 97535- Self Care, 02859- Manual therapy, G0283- Electrical stimulation (unattended), 97016- Vasopneumatic device, [id number]- Ultrasound, 79439 (1-2 muscles), 20561 (3+ muscles)- Dry Needling, Patient/Family education, Taping, Joint mobilization, Scar mobilization, Cryotherapy, and Moist heat  PLAN FOR NEXT SESSION: Continue PROM within surgical limitations;  Add isometrics; vaso for edema   Elianie Hubers, PT 07/10/2024, 1:40 PM  Date of referral: 06/06/24 Referring provider: Duwaine Sharper Referring diagnosis? 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 (if different than referring diagnosis) R shoulder pain, R shoulder weakness, R shoulder stiffness  What was this (referring dx) caused by? Surgery (Type: R RTC repair, SAD/DCR)  Lysle of Condition: Initial Onset (within last 3 months)   Laterality: Rt  Current Functional Measure Score: DASH Quick Dash = 81.8/10  Objective measurements identify impairments when they are compared to normal values, the uninvolved extremity, and prior level of function.  [x]  Yes  []  No PROM R shoulder:  75 deg flexion; 60 deg ABD, 10 deg ER  Objective assessment of functional ability: Severe  functional limitations   Briefly describe symptoms: New R RTC repair with SAD/DCR on 07/05/24; unable to use or move his R shoulder  How did symptoms start: surgery   Average pain intensity:  Last 24 hours: 4  Past week: 6  How often does the pt experience symptoms? Constantly  How much have the symptoms interfered with usual daily activities? Extremely  How has condition changed since care began at this facility? NA - initial visit  In general, how is the patients overall health? Good   BACK PAIN (STarT Back Screening Tool) No

## 2024-07-05 ENCOUNTER — Other Ambulatory Visit (HOSPITAL_BASED_OUTPATIENT_CLINIC_OR_DEPARTMENT_OTHER): Payer: Self-pay

## 2024-07-05 DIAGNOSIS — M75101 Unspecified rotator cuff tear or rupture of right shoulder, not specified as traumatic: Secondary | ICD-10-CM | POA: Diagnosis not present

## 2024-07-05 DIAGNOSIS — G8918 Other acute postprocedural pain: Secondary | ICD-10-CM | POA: Diagnosis not present

## 2024-07-05 DIAGNOSIS — M19011 Primary osteoarthritis, right shoulder: Secondary | ICD-10-CM | POA: Diagnosis not present

## 2024-07-05 DIAGNOSIS — M7551 Bursitis of right shoulder: Secondary | ICD-10-CM | POA: Diagnosis not present

## 2024-07-05 DIAGNOSIS — D1721 Benign lipomatous neoplasm of skin and subcutaneous tissue of right arm: Secondary | ICD-10-CM | POA: Diagnosis not present

## 2024-07-05 DIAGNOSIS — M65911 Unspecified synovitis and tenosynovitis, right shoulder: Secondary | ICD-10-CM | POA: Diagnosis not present

## 2024-07-05 MED ORDER — OXYCODONE-ACETAMINOPHEN 5-325 MG PO TABS
1.0000 | ORAL_TABLET | ORAL | 0 refills | Status: AC | PRN
  Filled 2024-07-05: qty 24, 4d supply, fill #0

## 2024-07-05 MED ORDER — DOXYCYCLINE HYCLATE 100 MG PO CAPS
100.0000 mg | ORAL_CAPSULE | Freq: Two times a day (BID) | ORAL | 0 refills | Status: DC
  Filled 2024-07-05: qty 10, 5d supply, fill #0

## 2024-07-10 ENCOUNTER — Other Ambulatory Visit: Payer: Self-pay

## 2024-07-10 ENCOUNTER — Encounter: Payer: Self-pay | Admitting: Rehabilitation

## 2024-07-10 ENCOUNTER — Ambulatory Visit: Attending: Sports Medicine | Admitting: Rehabilitation

## 2024-07-10 DIAGNOSIS — M25511 Pain in right shoulder: Secondary | ICD-10-CM | POA: Insufficient documentation

## 2024-07-10 DIAGNOSIS — M6281 Muscle weakness (generalized): Secondary | ICD-10-CM | POA: Diagnosis not present

## 2024-07-10 DIAGNOSIS — M25611 Stiffness of right shoulder, not elsewhere classified: Secondary | ICD-10-CM | POA: Insufficient documentation

## 2024-07-12 ENCOUNTER — Other Ambulatory Visit (HOSPITAL_BASED_OUTPATIENT_CLINIC_OR_DEPARTMENT_OTHER): Payer: Self-pay

## 2024-07-12 ENCOUNTER — Ambulatory Visit

## 2024-07-12 DIAGNOSIS — M6281 Muscle weakness (generalized): Secondary | ICD-10-CM | POA: Diagnosis not present

## 2024-07-12 DIAGNOSIS — M25611 Stiffness of right shoulder, not elsewhere classified: Secondary | ICD-10-CM

## 2024-07-12 DIAGNOSIS — M25511 Pain in right shoulder: Secondary | ICD-10-CM

## 2024-07-12 NOTE — Therapy (Signed)
 OUTPATIENT PHYSICAL THERAPY SHOULDER TREATMENT   Patient Name: Phillip Hughes MRN: [id number] DOB:[birth year], 68 y.o., male Today's Date: 07/12/2024   END OF SESSION:  PT End of Session - 07/12/24 1016     Visit Number 2    Date for PT Re-Evaluation 10/30/24    Authorization Type [employer] MCR    PT Start Time 0933    PT Stop Time 1015    PT Time Calculation (min) 42 min    Activity Tolerance Patient tolerated treatment well    Behavior During Therapy WFL for tasks assessed/performed           Past Medical History:  Diagnosis Date   Hypertension    Sciatica    Sinusitis    Past Surgical History:  Procedure Laterality Date   BACK SURGERY     HERNIA REPAIR     HIP SURGERY     NECK SURGERY     Patient Active Problem List   Diagnosis Date Noted   Hx of total hip arthroplasty, right 02/28/2023   Degenerative tear of medial meniscus of right knee 12/01/2022   Lumbar radiculopathy 11/29/2022   Peroneal mononeuropathy, 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    PCP: Dorina Loving, PA-C   REFERRING PROVIDER: Duwaine Ozell PARAS., MD   REFERRING DIAG: Tear of R rotator cuff, unspecified tear extent, unspecified whether traumatic (M75.101); 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); Subacromial bursitis R shoulder (M75.51); 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  PAIN: Are you having pain? Yes: NPRS scale: 2/10 now and worst Pain location: R shoulder Pain description:  throbbing Aggravating factors: any movements Relieving factors: rest, ice, meds  PERTINENT HISTORY:  R THA, Back surgery 2017, lumbar radiculopathy, Neck surgery, HTN  PRECAUTIONS: Shoulder RTC protocol precautions for Dr Duwaine  RED FLAGS: None  HAND DOMINANCE: Left  WEIGHT BEARING RESTRICTIONS: Yes NWB RUE  FALLS:  Has patient fallen in last 6 months? 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;  would like to return to playing golf (hasn't done since 2023)   OBJECTIVE: (objective measures completed at initial evaluation unless otherwise dated)  PATIENT SURVEYS:  Quick Dash = 81.8/100  COGNITION: Overall cognitive status: Within functional limits for tasks assessed     SENSATION: WFL  POSTURE: No Significant postural limitations  UPPER EXTREMITY ROM:   Passive ROM Right eval Left eval  Shoulder flexion 75 180  Shoulder extension NT 55  Shoulder abduction  175  Shoulder adduction    Shoulder internal rotation Hand to stomach in sling 75  Shoulder external rotation 10 80  Elbow flexion WNL = BUE   Elbow extension    Wrist flexion    Wrist extension    Wrist ulnar deviation    Wrist radial deviation    Wrist pronation    Wrist supination    (Blank rows = not tested)  UPPER EXTREMITY MMT: 07/10/24--No strength testing done at eval due to surgical precautions  MMT Right eval Left eval  Shoulder flexion    Shoulder extension    Shoulder abduction    Shoulder adduction    Shoulder internal rotation    Shoulder external rotation    Middle trapezius    Lower trapezius    Elbow flexion    Elbow extension     Wrist flexion    Wrist extension    Wrist ulnar deviation    Wrist radial deviation    Wrist pronation    Wrist supination    Grip strength (lbs)    (Blank rows = not tested)  SHOULDER SPECIAL TESTS:  N/A   JOINT MOBILITY TESTING:  N/A  PALPATION:  TTP over the anterior lateral deltoid area and upper traps    TODAY'S TREATMENT:  07/12/24 Seated:  -Open and close fist x 20  - forearm pronation/supination x 20  -Seated shoulder rolls x 20  -seated shrugs x 20  - seated ball squeezes x 20 PROM to R shoulder very gentle adhering to protocol precautions  SELF CARE: Provided education on strategies for edema management and how to don/doff sling with assistance; using pillow under RUE when lying on back for support and extension prevention; icing 15-20 min every 2 hrs; how to safely put on shirts without lifting RUE.  PATIENT EDUCATION:  Education details: PT eval findings, anticipated POC, and initial HEP  Person educated: Patient Education method: Explanation, Demonstration, Verbal cues, Tactile cues, and Handouts Education comprehension: verbalized understanding, verbal cues required, tactile cues required, and needs further education  HOME EXERCISE PROGRAM: Access Code: [email or web address] URL: [email or web address] Date: 07/10/2024 Prepared by: Garnette Montclair  Exercises - Seated Shoulder Shrugs  - 1 x daily - 7 x weekly - 3 sets - 10 reps - Seated Shoulder Shrug Circles AROM Forward  - 1 x daily - 7 x weekly - 3 sets - 10 reps - Seated Elbow Flexion AAROM  - 1 x daily - 7 x weekly - 3 sets - 10 reps - Forearm Pronation and Supination With Shoulder Sling  - 1 x daily - 7 x weekly - 3 sets - 10 reps - Wrist Flexion and Extension With Shoulder Sling  - 1 x daily - 7 x weekly - 3 sets - 10 reps - Ball Squeeze With Shoulder Sling  -  1 x daily - 7 x weekly - 3 sets - 10 reps   ASSESSMENT:  CLINICAL IMPRESSION: Pt was a little more painful today, reporting he was  unable to get comfortable last night d/t his sling. A little more limited with ROM d/t increased guarding and pain. Continued education on precautions as of now for RCR repair. Continued interventions per protocol.    OBJECTIVE IMPAIRMENTS: decreased ROM, decreased strength, decreased safety awareness, increased edema, increased muscle spasms, impaired flexibility, impaired UE functional use, and pain.   ACTIVITY LIMITATIONS: carrying, lifting, sleeping, bathing, toileting, dressing, self feeding, reach over head, and hygiene/grooming  PARTICIPATION LIMITATIONS: meal prep, cleaning, laundry, driving, and shopping  PERSONAL FACTORS: Age and 1-2 comorbidities: HTN, sciatica, lumbar radiculopathy and chronic back pain are also affecting patient's functional outcome.   REHAB POTENTIAL: Good  CLINICAL DECISION MAKING: Evolving/moderate complexity  EVALUATION COMPLEXITY: Moderate   GOALS: Goals reviewed with patient? Yes  SHORT TERM GOALS: Target date: 09/04/2024  Patient will be independent with initial HEP to improve outcomes and carryover.  Baseline: 100% PT assist required for correct completion Goal status: INITIAL  2.  Patient will report 25% improvement in R shoulder pain to improve QOL.   Baseline: 4/10  Goal status: INITIAL  3.  Patient will demonstrate R shoulder ROM (within protocol limitations) of 140 degrees flexion/scaption/abduction, 40 degrees ER, and IR to R buttock Baseline: see ROM table above Goal status: INITIAL  4.  Patient will demonstrate 3+/5 R shoulder strength for ability to do haircare, self hygiene, reaching above head  Baseline:  strength NT due to surgical precautions  Goal Status:  INITIAL LONG TERM GOALS: Target date: .wplus1  Patient will be independent with ongoing/advanced HEP for self-management at home.  Baseline: no advanced HEP Yet Goal status: INITIAL  2.  Patient will report 50-75% improvement in R shoulder pain to improve QOL.   Baseline: 4/10 Goal status: INITIAL  4.  Patient to improve R shoulder AROM to WNL without pain provocation to allow for increased ease of ADLs.  Baseline: Refer to above UE ROM table Goal status: INITIAL  5.  Patient will demonstrate improved R shoulder strength to >/= 5/5 for functional UE use. Baseline: Refer to above UE MMT table Goal status: INITIAL  6  Patient will report </= 40 % on QuickDASH (MCID = 14%) to demonstrate improved functional ability.  Baseline: 81.8% Goal status: INITIAL  7.  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   Baseline: not allowed due to surgical restrictions Goal status: INITIAL   PLAN:  PT FREQUENCY: 1-2x/week  PT DURATION: other: 16 weeks  PLANNED INTERVENTIONS: 97164- PT Re-evaluation, 97750- Physical Performance Testing, 97110-Therapeutic exercises, 97530- Therapeutic activity, [id number]- Neuromuscular re-education, 97535- Self Care, 02859- Manual therapy, G0283- Electrical stimulation (unattended), 97016- Vasopneumatic device, [id number]- Ultrasound, 79439 (1-2 muscles), 20561 (3+ muscles)- Dry Needling, Patient/Family education, Taping, Joint mobilization, Scar mobilization, Cryotherapy, and Moist heat  PLAN FOR NEXT SESSION: Continue PROM within surgical limitations;  Add isometrics; vaso for edema   Sol LITTIE Gaskins, PTA 07/12/2024, 10:16 AM  Date of referral: 06/06/24 Referring provider: Duwaine Sharper Referring diagnosis? 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 (if different than referring diagnosis) R shoulder pain, R shoulder weakness, R shoulder stiffness  What was this (referring dx)  caused by? Surgery (Type: R RTC repair, SAD/DCR)  Lysle of Condition: Initial Onset (within last 3  months)   Laterality: Rt  Current Functional Measure Score: DASH Quick Dash = 81.8/10  Objective measurements identify impairments when they are compared to normal values, the uninvolved extremity, and prior level of function.  [x]  Yes  []  No PROM R shoulder:  75 deg flexion; 60 deg ABD, 10 deg ER  Objective assessment of functional ability: Severe functional limitations   Briefly describe symptoms: New R RTC repair with SAD/DCR on 07/05/24; unable to use or move his R shoulder  How did symptoms start: surgery   Average pain intensity:  Last 24 hours: 4  Past week: 6  How often does the pt experience symptoms? Constantly  How much have the symptoms interfered with usual daily activities? Extremely  How has condition changed since care began at this facility? NA - initial visit  In general, how is the patients overall health? Good   BACK PAIN (STarT Back Screening Tool) No

## 2024-07-16 DIAGNOSIS — M5416 Radiculopathy, lumbar region: Secondary | ICD-10-CM | POA: Diagnosis not present

## 2024-07-17 ENCOUNTER — Ambulatory Visit: Payer: Self-pay | Attending: Sports Medicine

## 2024-07-17 DIAGNOSIS — R252 Cramp and spasm: Secondary | ICD-10-CM | POA: Diagnosis not present

## 2024-07-17 DIAGNOSIS — M25511 Pain in right shoulder: Secondary | ICD-10-CM | POA: Diagnosis not present

## 2024-07-17 DIAGNOSIS — M25611 Stiffness of right shoulder, not elsewhere classified: Secondary | ICD-10-CM | POA: Diagnosis not present

## 2024-07-17 DIAGNOSIS — M6281 Muscle weakness (generalized): Secondary | ICD-10-CM | POA: Diagnosis not present

## 2024-07-17 DIAGNOSIS — G8929 Other chronic pain: Secondary | ICD-10-CM | POA: Diagnosis not present

## 2024-07-17 DIAGNOSIS — M5441 Lumbago with sciatica, right side: Secondary | ICD-10-CM | POA: Diagnosis not present

## 2024-07-17 NOTE — Therapy (Addendum)
 OUTPATIENT PHYSICAL THERAPY SHOULDER TREATMENT   Patient Name: Phillip Hughes MRN: [id number] DOB:1955/12/16, 68 y.o., male Today's Date: 07/17/2024   END OF SESSION:  PT End of Session - 07/17/24 0851     Visit Number 3    Date for PT Re-Evaluation 10/30/24    Authorization Type [employer] MCR    PT Start Time 0803    PT Stop Time 0846    PT Time Calculation (min) 43 min    Activity Tolerance Patient tolerated treatment well    Behavior During Therapy WFL for tasks assessed/performed            Past Medical History:  Diagnosis Date   Hypertension    Sciatica    Sinusitis    Past Surgical History:  Procedure Laterality Date   BACK SURGERY     HERNIA REPAIR     HIP SURGERY     NECK SURGERY     Patient Active Problem List   Diagnosis Date Noted   Hx of total hip arthroplasty, right 02/28/2023   Degenerative tear of medial meniscus of right knee 12/01/2022   Lumbar radiculopathy 11/29/2022   Peroneal mononeuropathy, 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    PCP: Dorina Loving, PA-C   REFERRING PROVIDER: Duwaine Ozell PARAS., MD   REFERRING DIAG: Tear of R rotator cuff, unspecified tear extent, unspecified whether traumatic (M75.101); 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); Subacromial bursitis R shoulder (M75.51); 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 Shoulder is a little sore  PAIN: Are you having pain? Yes: NPRS scale: 2/10 now  and worst Pain location: R shoulder Pain description: throbbing Aggravating factors: any movements Relieving factors: rest, ice, meds  PERTINENT HISTORY:  R THA, Back surgery 2017, lumbar radiculopathy, Neck surgery, HTN  PRECAUTIONS: Shoulder RTC protocol precautions for Dr Duwaine  RED FLAGS: None  HAND DOMINANCE: Left  WEIGHT BEARING RESTRICTIONS: Yes NWB RUE  FALLS:  Has patient fallen in last 6 months? 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;  would like to return to playing golf (hasn't done since 2023)   OBJECTIVE: (objective measures completed at initial evaluation unless otherwise dated)  PATIENT SURVEYS:  Quick Dash = 81.8/100  COGNITION: Overall cognitive status: Within functional limits for tasks assessed     SENSATION: WFL  POSTURE: No Significant postural limitations  UPPER EXTREMITY ROM:   Passive ROM Right eval Left eval  Shoulder flexion 75 180  Shoulder extension NT 55  Shoulder abduction  175  Shoulder adduction    Shoulder internal rotation Hand to stomach in sling 75  Shoulder external rotation 10 80  Elbow flexion WNL = BUE   Elbow extension    Wrist flexion    Wrist extension    Wrist ulnar deviation    Wrist radial deviation    Wrist pronation    Wrist supination    (Blank rows = not tested)  UPPER EXTREMITY MMT: 07/10/24--No strength testing done at eval due to surgical precautions  MMT Right eval Left eval  Shoulder flexion    Shoulder extension    Shoulder abduction    Shoulder adduction    Shoulder internal rotation    Shoulder external rotation    Middle trapezius     Lower trapezius    Elbow flexion    Elbow extension    Wrist flexion    Wrist extension    Wrist ulnar deviation    Wrist radial deviation    Wrist pronation    Wrist supination    Grip strength (lbs)    (Blank rows = not tested)  SHOULDER SPECIAL TESTS:  N/A   JOINT MOBILITY TESTING:  N/A  PALPATION:  TTP over the anterior lateral deltoid area and upper traps    TODAY'S TREATMENT:  07/17/24 NEUROMUSCULAR RE-EDUCATION: To improve posture  Scap retraction 2x10 Shoulder rolls 2x10  THERAPEUTIC EXERCISE: To improve strength, endurance, ROM, and flexibility.  Grip squeezes x 20 in sling Seated wrist flexion and extension x 20 Seated elbow flexion with pillow support x 20 PROM to R shoulder not past 90 deg,very small ER PROM Education on precautions and HEP with patient and spouse  07/12/24 Seated:  -Open and close fist x 20  - forearm pronation/supination x 20  -Seated shoulder rolls x 20  -seated shrugs x 20  - seated ball squeezes x 20 PROM to R shoulder very gentle adhering to protocol precautions  SELF CARE: Provided education on strategies for edema management and how to don/doff sling with assistance; using pillow under RUE when lying on back for support and extension prevention; icing 15-20 min every 2 hrs; how to safely put on shirts without lifting RUE.  PATIENT EDUCATION:  Education details: PT eval findings, anticipated POC, and initial HEP  Person educated: Patient Education method: Explanation, Demonstration, Verbal cues, Tactile cues, and Handouts Education comprehension: verbalized understanding, verbal cues required, tactile cues required, and needs further education  HOME EXERCISE PROGRAM: Access Code: [email or web address] URL: [email or web address] Date: 07/10/2024 Prepared by: Garnette Montclair  Exercises - Seated Shoulder Shrugs  - 1 x daily - 7 x weekly - 3 sets - 10 reps - Seated Shoulder Shrug Circles AROM Forward  - 1 x daily -  7 x  weekly - 3 sets - 10 reps - Seated Elbow Flexion AAROM  - 1 x daily - 7 x weekly - 3 sets - 10 reps - Forearm Pronation and Supination With Shoulder Sling  - 1 x daily - 7 x weekly - 3 sets - 10 reps - Wrist Flexion and Extension With Shoulder Sling  - 1 x daily - 7 x weekly - 3 sets - 10 reps - Ball Squeeze With Shoulder Sling  - 1 x daily - 7 x weekly - 3 sets - 10 reps   ASSESSMENT:  CLINICAL IMPRESSION: Pt continues to respond well to treatment. Continued mostly with PROM for R shoulder to prevent freezing and to allow for joint nutrition. Cues with shoulder squeezes to retract shoulder blades properly. Will drop down to 1x a week for now d/t copay along with phase in protocol.   OBJECTIVE IMPAIRMENTS: decreased ROM, decreased strength, decreased safety awareness, increased edema, increased muscle spasms, impaired flexibility, impaired UE functional use, and pain.   ACTIVITY LIMITATIONS: carrying, lifting, sleeping, bathing, toileting, dressing, self feeding, reach over head, and hygiene/grooming  PARTICIPATION LIMITATIONS: meal prep, cleaning, laundry, driving, and shopping  PERSONAL FACTORS: Age and 1-2 comorbidities: HTN, sciatica, lumbar radiculopathy and chronic back pain are also affecting patient's functional outcome.   REHAB POTENTIAL: Good  CLINICAL DECISION MAKING: Evolving/moderate complexity  EVALUATION COMPLEXITY: Moderate   GOALS: Goals reviewed with patient? Yes  SHORT TERM GOALS: Target date: 09/04/2024  Patient will be independent with initial HEP to improve outcomes and carryover.  Baseline: 100% PT assist required for correct completion Goal status: PARTIALLY MET- independent with most exercises but needs cues with scapular retraction 07/17/24  2.  Patient will report 25% improvement in R shoulder pain to improve QOL.   Baseline: 4/10  Goal status: INITIAL  3.  Patient will demonstrate R shoulder ROM (within protocol limitations) of 140 degrees  flexion/scaption/abduction, 40 degrees ER, and IR to R buttock Baseline: see ROM table above Goal status: INITIAL  4.  Patient will demonstrate 3+/5 R shoulder strength for ability to do haircare, self hygiene, reaching above head  Baseline:  strength NT due to surgical precautions  Goal Status:  INITIAL LONG TERM GOALS: Target date: .wplus1  Patient will be independent with ongoing/advanced HEP for self-management at home.  Baseline: no advanced HEP Yet Goal status: INITIAL  2.  Patient will report 50-75% improvement in R shoulder pain to improve QOL.  Baseline: 4/10 Goal status: INITIAL  4.  Patient to improve R shoulder AROM to WNL without pain provocation to allow for increased ease of ADLs.  Baseline: Refer to above UE ROM table Goal status: INITIAL  5.  Patient will demonstrate improved R shoulder strength to >/= 5/5 for functional UE use. Baseline: Refer to above UE MMT table Goal status: INITIAL  6  Patient will report </= 40 % on QuickDASH (MCID = 14%) to demonstrate improved functional ability.  Baseline: 81.8% Goal status: INITIAL  7.  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   Baseline: not allowed due to surgical restrictions Goal status: INITIAL   PLAN:  PT FREQUENCY: 1-2x/week  PT DURATION: other: 16 weeks  PLANNED INTERVENTIONS: 97164- PT Re-evaluation, 97750- Physical Performance Testing, 97110-Therapeutic exercises, 97530- Therapeutic activity, [id number]- Neuromuscular re-education, 97535- Self Care, 02859- Manual therapy, H9716- Electrical stimulation (unattended), 97016- Vasopneumatic device, [id number]- Ultrasound, 79439 (1-2 muscles), 20561 (3+ muscles)- Dry  Needling, Patient/Family education, Taping, Joint mobilization, Scar mobilization, Cryotherapy, and Moist heat  PLAN FOR NEXT SESSION: Continue PROM within surgical limitations 2 weeks post op on 07/19/24;  Add isometrics; vaso for  edema   Sol LITTIE Gaskins, PTA 07/17/2024, 8:58 AM  Date of referral: 06/06/24 Referring provider: Duwaine Sharper Referring diagnosis? 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 (if different than referring diagnosis) R shoulder pain, R shoulder weakness, R shoulder stiffness  What was this (referring dx) caused by? Surgery (Type: R RTC repair, SAD/DCR)  Lysle of Condition: Initial Onset (within last 3 months)   Laterality: Rt  Current Functional Measure Score: DASH Quick Dash = 81.8/10  Objective measurements identify impairments when they are compared to normal values, the uninvolved extremity, and prior level of function.  [x]  Yes  []  No PROM R shoulder:  75 deg flexion; 60 deg ABD, 10 deg ER  Objective assessment of functional ability: Severe functional limitations   Briefly describe symptoms: New R RTC repair with SAD/DCR on 07/05/24; unable to use or move his R shoulder  How did symptoms start: surgery   Average pain intensity:  Last 24 hours: 4  Past week: 6  How often does the pt experience symptoms? Constantly  How much have the symptoms interfered with usual daily activities? Extremely  How has condition changed since care began at this facility? NA - initial visit  In general, how is the patients overall health? Good   BACK PAIN (STarT Back Screening Tool) No

## 2024-07-19 ENCOUNTER — Encounter: Admitting: Rehabilitation

## 2024-07-20 ENCOUNTER — Other Ambulatory Visit (HOSPITAL_BASED_OUTPATIENT_CLINIC_OR_DEPARTMENT_OTHER): Payer: Self-pay

## 2024-07-20 MED ORDER — HYDROCODONE-ACETAMINOPHEN 5-325 MG PO TABS
1.0000 | ORAL_TABLET | Freq: Four times a day (QID) | ORAL | 0 refills | Status: AC | PRN
  Filled 2024-07-20: qty 12, 3d supply, fill #0

## 2024-07-23 ENCOUNTER — Ambulatory Visit

## 2024-07-23 DIAGNOSIS — M25511 Pain in right shoulder: Secondary | ICD-10-CM

## 2024-07-23 DIAGNOSIS — G8929 Other chronic pain: Secondary | ICD-10-CM | POA: Diagnosis not present

## 2024-07-23 DIAGNOSIS — M25611 Stiffness of right shoulder, not elsewhere classified: Secondary | ICD-10-CM | POA: Diagnosis not present

## 2024-07-23 DIAGNOSIS — M6281 Muscle weakness (generalized): Secondary | ICD-10-CM

## 2024-07-23 DIAGNOSIS — M5441 Lumbago with sciatica, right side: Secondary | ICD-10-CM | POA: Diagnosis not present

## 2024-07-23 DIAGNOSIS — R252 Cramp and spasm: Secondary | ICD-10-CM | POA: Diagnosis not present

## 2024-07-23 NOTE — Therapy (Addendum)
 OUTPATIENT PHYSICAL THERAPY SHOULDER TREATMENT   Patient Name: Phillip Hughes MRN: [id number] DOB:1956/02/23, 68 y.o., male Today's Date: 07/23/2024   END OF SESSION:  PT End of Session - 07/23/24 0849     Visit Number 4    Date for PT Re-Evaluation 10/30/24    Authorization Type [employer] MCR    PT Start Time 0846    PT Stop Time 0950    PT Time Calculation (min) 64 min    Activity Tolerance Patient tolerated treatment well    Behavior During Therapy [facility] for tasks assessed/performed             Past Medical History:  Diagnosis Date   Hypertension    Sciatica    Sinusitis    Past Surgical History:  Procedure Laterality Date   BACK SURGERY     HERNIA REPAIR     HIP SURGERY     NECK SURGERY     Patient Active Problem List   Diagnosis Date Noted   Hx of total hip arthroplasty, right 02/28/2023   Degenerative tear of medial meniscus of right knee 12/01/2022   Lumbar radiculopathy 11/29/2022   Peroneal mononeuropathy, 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    PCP: Dorina Loving, PA-C   REFERRING PROVIDER: Duwaine Ozell PARAS., MD   REFERRING DIAG: Tear of R rotator cuff, unspecified tear extent, unspecified whether traumatic (M75.101); 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); Subacromial bursitis R shoulder (M75.51); Chronic R shoulder pain (M25.511, G89.29)  THERAPY DIAG:  Acute pain of right shoulder  Muscle weakness (generalized)  Stiffness of right shoulder, not elsewhere classified  RATIONALE FOR EVALUATION AND TREATMENT: Rehabilitation  ONSET DATE: 07/05/54  NEXT MD VISIT: 08/15/24   SUBJECTIVE:                                                                                                                                                                                                         SUBJECTIVE STATEMENT:  Good reports from doctor, they removed stitches, he did not sleep too well last night because of his back  PAIN: Are you having pain? Yes: NPRS scale: 3/10 now and  worst Pain location: R shoulder Pain description: throbbing Aggravating factors: any movements Relieving factors: rest, ice, meds  PERTINENT HISTORY:  R THA, Back surgery 2017, lumbar radiculopathy, Neck surgery, HTN  PRECAUTIONS: Shoulder RTC protocol precautions for Dr Duwaine  RED FLAGS: None  HAND DOMINANCE: Left  WEIGHT BEARING RESTRICTIONS: Yes NWB RUE  FALLS:  Has patient fallen in last 6 months? 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;  would like to return to playing golf (hasn't done since 2023)   OBJECTIVE: (objective measures completed at initial evaluation unless otherwise dated)  PATIENT SURVEYS:  Quick Dash = 81.8/100  COGNITION: Overall cognitive status: Within functional limits for tasks assessed     SENSATION: WFL  POSTURE: No Significant postural limitations  UPPER EXTREMITY ROM:   Passive ROM Right eval Left eval R 07/23/24  Shoulder flexion 75 180 90  Shoulder extension NT 55   Shoulder abduction  175   Shoulder adduction     Shoulder internal rotation Hand to stomach in sling 75   Shoulder external rotation 10 80 10  Elbow flexion WNL = BUE    Elbow extension     Wrist flexion     Wrist extension     Wrist ulnar deviation     Wrist radial deviation     Wrist pronation     Wrist supination     (Blank rows = not tested)  UPPER EXTREMITY MMT: 07/10/24--No strength testing done at eval due to surgical precautions  MMT Right eval Left eval  Shoulder flexion    Shoulder extension    Shoulder abduction    Shoulder adduction    Shoulder internal rotation    Shoulder external rotation     Middle trapezius    Lower trapezius    Elbow flexion    Elbow extension    Wrist flexion    Wrist extension    Wrist ulnar deviation    Wrist radial deviation    Wrist pronation    Wrist supination    Grip strength (lbs)    (Blank rows = not tested)  SHOULDER SPECIAL TESTS:  N/A   JOINT MOBILITY TESTING:  N/A  PALPATION:  TTP over the anterior lateral deltoid area and upper traps    TODAY'S TREATMENT:  07/23/24 NEUROMUSCULAR RE-EDUCATION: To improve posture  Scap retraction 2x10 Shoulder rolls 2x10 Assisted thoracic extension in sling x 10  Manual Therapy:  STM to B UT, LS - cues to relax shoulders  THERAPEUTIC EXERCISE: To improve strength, endurance, ROM, and flexibility.  PROM to R shoulder flexion, abd, ER per patient protocol Sub max isometrics; shoulder flexion, ER, ext, abd in sling 5x5  Vaso to R shoulder low compression x 15 min  07/17/24 NEUROMUSCULAR RE-EDUCATION: To improve posture  Scap retraction 2x10 Shoulder rolls 2x10  THERAPEUTIC EXERCISE: To improve strength, endurance, ROM, and flexibility.  Grip squeezes x 20 in sling Seated wrist flexion and extension x 20 Seated elbow flexion with pillow support x 20 PROM to R shoulder not past 90 deg,very small ER PROM Education on precautions and HEP with patient and spouse  07/12/24 Seated:  -Open and close fist x 20  - forearm pronation/supination x 20  -Seated shoulder rolls x 20  -seated shrugs x 20  - seated ball squeezes x 20 PROM to R shoulder very gentle adhering to protocol precautions  SELF CARE: Provided education on strategies for edema management and how to don/doff sling with assistance; using pillow under RUE when lying on back for support and extension prevention; icing 15-20 min every 2 hrs; how to safely put on shirts without lifting RUE.  PATIENT  EDUCATION:  Education details: Pt instructed to do isometrics in sling despite pictures showing otherwise Person educated:  Patient Education method: Explanation, Demonstration, Verbal cues, Tactile cues, and Handouts Education comprehension: verbalized understanding, verbal cues required, tactile cues required, and needs further education  HOME EXERCISE PROGRAM: Access Code: [email or web address] URL: [email or web address] Date: 07/23/2024 Prepared by: Aiesha Leland  Exercises - Seated Shoulder Shrugs  - 1 x daily - 7 x weekly - 3 sets - 10 reps - Seated Shoulder Shrug Circles AROM Forward  - 1 x daily - 7 x weekly - 3 sets - 10 reps - Seated Elbow Flexion AAROM  - 1 x daily - 7 x weekly - 3 sets - 10 reps - Forearm Pronation and Supination With Shoulder Sling  - 1 x daily - 7 x weekly - 3 sets - 10 reps - Wrist Flexion and Extension With Shoulder Sling  - 1 x daily - 7 x weekly - 3 sets - 10 reps - Ball Squeeze With Shoulder Sling  - 1 x daily - 7 x weekly - 3 sets - 10 reps - Isometric Shoulder Extension with Ball at [employer]  - 1 x daily - 7 x weekly - 3 sets - 5 reps - 5 sec hold - Isometric Shoulder Flexion  - 1 x daily - 7 x weekly - 3 sets - 5 reps - 5 sec hold - Isometric Shoulder External Rotation  - 1 x daily - 7 x weekly - 3 sets - 5 reps - 5 sec hold - Standing Isometric Shoulder Abduction with Doorway - Arm Bent  - 1 x daily - 7 x weekly - 3 sets - 5 reps - 5 sec hold   ASSESSMENT:  CLINICAL IMPRESSION: Pt continues to respond well to treatment. Continued mostly with PROM for R shoulder to prevent freezing and to allow for joint nutrition. Introduced [employer] today with good response. Pt is showing good PROM with shoulder flexion, ER is pretty limited but he doesn't seem to be frozen. Concluded session with vaso to address pain and edema.   OBJECTIVE IMPAIRMENTS: decreased ROM, decreased strength, decreased safety awareness, increased edema, increased muscle spasms, impaired flexibility, impaired UE functional use, and pain.   ACTIVITY LIMITATIONS: carrying, lifting, sleeping, bathing,  toileting, dressing, self feeding, reach over head, and hygiene/grooming  PARTICIPATION LIMITATIONS: meal prep, cleaning, laundry, driving, and shopping  PERSONAL FACTORS: Age and 1-2 comorbidities: HTN, sciatica, lumbar radiculopathy and chronic back pain are also affecting patient's functional outcome.   REHAB POTENTIAL: Good  CLINICAL DECISION MAKING: Evolving/moderate complexity  EVALUATION COMPLEXITY: Moderate   GOALS: Goals reviewed with patient? Yes  SHORT TERM GOALS: Target date: 09/04/2024  Patient will be independent with initial HEP to improve outcomes and carryover.  Baseline: 100% PT assist required for correct completion Goal status: PARTIALLY MET- independent with most exercises but needs cues with scapular retraction 07/17/24  2.  Patient will report 25% improvement in R shoulder pain to improve QOL.   Baseline: 4/10  Goal status: INITIAL  3.  Patient will demonstrate R shoulder ROM (within protocol limitations) of 140 degrees flexion/scaption/abduction, 40 degrees ER, and IR to R buttock Baseline: see ROM table above Goal status: IN PROGRESS- 07/23/24 progressing, limited to 90 deg flexion as of now, no abduction or ER more than 30 deg  4.  Patient will demonstrate 3+/5 R shoulder strength for ability to do haircare, self hygiene, reaching above head  Baseline:  strength NT due to surgical precautions  Goal  Status:  INITIAL LONG TERM GOALS: Target date: .wplus1  Patient will be independent with ongoing/advanced HEP for self-management at home.  Baseline: no advanced HEP Yet Goal status: INITIAL  2.  Patient will report 50-75% improvement in R shoulder pain to improve QOL.  Baseline: 4/10 Goal status: INITIAL  4.  Patient to improve R shoulder AROM to WNL without pain provocation to allow for increased ease of ADLs.  Baseline: Refer to above UE ROM table Goal status: INITIAL  5.  Patient will demonstrate improved R shoulder strength to >/= 5/5 for functional  UE use. Baseline: Refer to above UE MMT table Goal status: INITIAL  6  Patient will report </= 40 % on QuickDASH (MCID = 14%) to demonstrate improved functional ability.  Baseline: 81.8% Goal status: INITIAL  7.  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   Baseline: not allowed due to surgical restrictions Goal status: INITIAL   PLAN:  PT FREQUENCY: 1-2x/week  PT DURATION: other: 16 weeks  PLANNED INTERVENTIONS: 97164- PT Re-evaluation, 97750- Physical Performance Testing, 97110-Therapeutic exercises, 97530- Therapeutic activity, 97112- Neuromuscular re-education, 97535- Self Care, 02859- Manual therapy, [id number]- Electrical stimulation (unattended), 97016- Vasopneumatic device, [id number]- Ultrasound, 79439 (1-2 muscles), 20561 (3+ muscles)- Dry Needling, Patient/Family education, Taping, Joint mobilization, Scar mobilization, Cryotherapy, and Moist heat  PLAN FOR NEXT SESSION: Continue PROM within surgical limitations 3 weeks post op on 07/26/24; postural facilitation; manual for UT, levator; vaso for edema   Sol LITTIE Gaskins, PTA 07/23/2024, 10:14 AM  Date of referral: 06/06/24 Referring provider: Duwaine Sharper Referring diagnosis? 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 (if different than referring diagnosis) R shoulder pain, R shoulder weakness, R shoulder stiffness  What was this (referring dx) caused by? Surgery (Type: R RTC repair, SAD/DCR)  [place] of Condition: Initial Onset (within last 3 months)   Laterality: Rt  Current Functional Measure Score: DASH Quick Dash = 81.8/10  Objective measurements identify impairments when they are compared to normal values, the uninvolved extremity, and prior level of function.  [x]  Yes  []   No PROM R shoulder:  75 deg flexion; 60 deg ABD, 10 deg ER  Objective assessment of functional ability: Severe functional limitations   Briefly describe symptoms: New R RTC repair with SAD/DCR on 07/05/24; unable to use or move his R shoulder  How did symptoms start: surgery   Average pain intensity:  Last 24 hours: 4  Past week: 6  How often does the pt experience symptoms? Constantly  How much have the symptoms interfered with usual daily activities? Extremely  How has condition changed since care began at this facility? NA - initial visit  In general, how is the patients overall health? Good   BACK PAIN (STarT Back Screening Tool) No

## 2024-07-26 ENCOUNTER — Encounter: Admitting: Rehabilitation

## 2024-07-30 ENCOUNTER — Ambulatory Visit: Admitting: Rehabilitation

## 2024-07-30 DIAGNOSIS — M25611 Stiffness of right shoulder, not elsewhere classified: Secondary | ICD-10-CM | POA: Diagnosis not present

## 2024-07-30 DIAGNOSIS — M6281 Muscle weakness (generalized): Secondary | ICD-10-CM | POA: Diagnosis not present

## 2024-07-30 DIAGNOSIS — G8929 Other chronic pain: Secondary | ICD-10-CM | POA: Diagnosis not present

## 2024-07-30 DIAGNOSIS — M25511 Pain in right shoulder: Secondary | ICD-10-CM

## 2024-07-30 DIAGNOSIS — R252 Cramp and spasm: Secondary | ICD-10-CM | POA: Diagnosis not present

## 2024-07-30 DIAGNOSIS — M5441 Lumbago with sciatica, right side: Secondary | ICD-10-CM | POA: Diagnosis not present

## 2024-07-30 NOTE — Therapy (Signed)
 OUTPATIENT PHYSICAL THERAPY SHOULDER TREATMENT   Patient Name: Phillip Hughes MRN: [id number] DOB:[birth year], 67 y.o., male Today's Date: 07/30/2024   END OF SESSION:  PT End of Session - 07/30/24 0801     Visit Number 5    Date for PT Re-Evaluation 10/30/24    Authorization Type [employer] MCR    PT Start Time 0800    PT Stop Time 0900    PT Time Calculation (min) 60 min    Activity Tolerance Patient tolerated treatment well    Behavior During Therapy WFL for tasks assessed/performed             Past Medical History:  Diagnosis Date   Hypertension    Sciatica    Sinusitis    Past Surgical History:  Procedure Laterality Date   BACK SURGERY     HERNIA REPAIR     HIP SURGERY     NECK SURGERY     Patient Active Problem List   Diagnosis Date Noted   Hx of total hip arthroplasty, right 02/28/2023   Degenerative tear of medial meniscus of right knee 12/01/2022   Lumbar radiculopathy 11/29/2022   Peroneal mononeuropathy, 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    PCP: Dorina Loving, PA-C   REFERRING PROVIDER: Duwaine Ozell PARAS., MD   REFERRING DIAG: Tear of R rotator cuff, unspecified tear extent, unspecified whether traumatic (M75.101); 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); Subacromial bursitis R shoulder (M75.51); Chronic R shoulder pain (M25.511, G89.29)  THERAPY DIAG:  Acute pain of right shoulder  Muscle weakness (generalized)  Stiffness of right shoulder, not elsewhere classified  Chronic right shoulder pain  RATIONALE FOR EVALUATION AND TREATMENT: Rehabilitation  ONSET DATE: [birth year]  NEXT MD VISIT: 08/15/24   SUBJECTIVE:                                                                                                                                                                                                         SUBJECTIVE STATEMENT: States shoulder is feeling better.  Only 2/10 pain today.  States back/leg pain and leg weakness/paresis are his biggest c/o at this point.  States  probably going to need back surgery after his shoulder is recovered   PAIN: Are you having pain? Yes: NPRS scale: 3/10 now and worst Pain location: R shoulder Pain description: throbbing Aggravating factors: any movements Relieving factors: rest, ice, meds  PERTINENT HISTORY:  R THA, Back surgery 2017, lumbar radiculopathy, Neck surgery, HTN  PRECAUTIONS: Shoulder RTC protocol precautions for Dr Duwaine  RED FLAGS: None  HAND DOMINANCE: Left  WEIGHT BEARING RESTRICTIONS: Yes  NWB RUE  FALLS:  Has patient fallen in last 6 months? 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;  would like to return to playing golf (hasn't done since 2023)   OBJECTIVE: (objective measures completed at initial evaluation unless otherwise dated)  PATIENT SURVEYS:  Quick Dash = 81.8/100  COGNITION: Overall cognitive status: Within functional limits for tasks assessed     SENSATION: WFL  POSTURE: No Significant postural limitations  UPPER EXTREMITY ROM:   Passive ROM Right eval Left eval R 07/23/24  Shoulder flexion 75 180 90  Shoulder extension NT 55   Shoulder abduction  175   Shoulder adduction     Shoulder internal rotation Hand to stomach in sling 75   Shoulder external rotation 10 80 10  Elbow flexion WNL = BUE    Elbow extension     Wrist flexion     Wrist extension     Wrist ulnar deviation     Wrist radial deviation     Wrist pronation     Wrist supination     (Blank rows = not tested)  UPPER EXTREMITY MMT: 07/10/24--No strength testing done at eval due to surgical precautions  MMT Right eval Left eval  Shoulder flexion     Shoulder extension    Shoulder abduction    Shoulder adduction    Shoulder internal rotation    Shoulder external rotation    Middle trapezius    Lower trapezius    Elbow flexion    Elbow extension    Wrist flexion    Wrist extension    Wrist ulnar deviation    Wrist radial deviation    Wrist pronation    Wrist supination    Grip strength (lbs)    (Blank rows = not tested)  SHOULDER SPECIAL TESTS:  N/A   JOINT MOBILITY TESTING:  N/A  PALPATION:  TTP over the anterior lateral deltoid area and upper traps    TODAY'S TREATMENT:  07/30/24 THERAPEUTIC ACTIVITIES: To improve functional performance.  Demonstration, verbal and tactile cues throughout for technique. Shoulder shrugs x 30 BUE Shoulder rolls F x 30 BUE;  B x 30 BUE Scap retraction x 20 BUE Elbow AAROM with contralateral UE x 30 RUE Forearm pronate/supination x 30 Rue Wrist flex/ext AROM  x 30 RUE Ball squeeze attempted, but increased wrist pain at carpel tunnel so this is withheld til next visit Sub max isometrics in sling for shoulder extension x 30 RUE  MANUAL THERAPY: To promote increased ROM utilizing PROM. PROM R shoulder in all planes;  ER to 30 degrees in neutral, 45 deg scaption, and 90 deg scaption;  flexion/abd to 90-100 degrees limited from further by pain; circumduction within precaution/protocol ROM   gentle R shoulder joint oscillatations at neutral  Modalities:   Vasopneumatic x 15' to R shoulder seated at min compression   07/23/24 NEUROMUSCULAR RE-EDUCATION: To improve posture  Scap retraction 2x10 Shoulder rolls 2x10 Assisted thoracic extension in sling x 10  Manual Therapy:  STM to B UT, LS - cues to relax shoulders  THERAPEUTIC EXERCISE: To improve strength, endurance, ROM, and flexibility.  PROM to R shoulder flexion, abd, ER per patient protocol Sub max  isometrics; shoulder flexion, ER, ext, abd in sling 5x5  Vaso to R shoulder low compression x 15 min  07/17/24 NEUROMUSCULAR  RE-EDUCATION: To improve posture  Scap retraction 2x10 Shoulder rolls 2x10  THERAPEUTIC EXERCISE: To improve strength, endurance, ROM, and flexibility.  Grip squeezes x 20 in sling Seated wrist flexion and extension x 20 Seated elbow flexion with pillow support x 20 PROM to R shoulder not past 90 deg,very small ER PROM Education on precautions and HEP with patient and spouse  07/12/24 Seated:  -Open and close fist x 20  - forearm pronation/supination x 20  -Seated shoulder rolls x 20  -seated shrugs x 20  - seated ball squeezes x 20 PROM to R shoulder very gentle adhering to protocol precautions  SELF CARE: Provided education on strategies for edema management and how to don/doff sling with assistance; using pillow under RUE when lying on back for support and extension prevention; icing 15-20 min every 2 hrs; how to safely put on shirts without lifting RUE.  PATIENT EDUCATION:  Education details: isometrics review Person educated: Patient Education method: Explanation, Demonstration, Verbal cues, Tactile cues, and Handouts Education comprehension: verbalized understanding, verbal cues required, tactile cues required, and needs further education  HOME EXERCISE PROGRAM: Access Code: [email or web address] URL: [email or web address] Date: 07/23/2024 Prepared by: Phillip Hughes  Exercises - Seated Shoulder Shrugs  - 1 x daily - 7 x weekly - 3 sets - 10 reps - Seated Shoulder Shrug Circles AROM Forward  - 1 x daily - 7 x weekly - 3 sets - 10 reps - Seated Elbow Flexion AAROM  - 1 x daily - 7 x weekly - 3 sets - 10 reps - Forearm Pronation and Supination With Shoulder Sling  - 1 x daily - 7 x weekly - 3 sets - 10 reps - Wrist Flexion and Extension With Shoulder Sling  - 1 x daily - 7 x weekly - 3 sets - 10 reps - Ball Squeeze With Shoulder Sling  - 1 x daily - 7 x weekly - 3 sets - 10 reps - Isometric Shoulder Extension with Ball at [employer]  - 1 x daily - 7 x weekly - 3 sets - 5 reps -  5 sec hold - Isometric Shoulder Flexion  - 1 x daily - 7 x weekly - 3 sets - 5 reps - 5 sec hold - Isometric Shoulder External Rotation  - 1 x daily - 7 x weekly - 3 sets - 5 reps - 5 sec hold - Standing Isometric Shoulder Abduction with Doorway - Arm Bent  - 1 x daily - 7 x weekly - 3 sets - 5 reps - 5 sec hold   ASSESSMENT:  CLINICAL IMPRESSION:  Patient is progressing well with ROM.  ER today is 30 degrees.  I think it could be more if he were able to come to PT more than once weekly for PROM.   His protocol only limits ER to 30 degrees for subscapularis repairs so he is clear for more since this was supraspinatus only.  He has room for improvement in all PROM areas.  Pt remains necessary for this.  We will continue once per week per patient request due to copay.  OBJECTIVE IMPAIRMENTS: decreased ROM, decreased strength, decreased safety awareness, increased edema, increased muscle spasms, impaired flexibility, impaired UE functional use, and pain.   ACTIVITY LIMITATIONS: carrying, lifting, sleeping, bathing, toileting, dressing, self feeding, reach over head, and hygiene/grooming  PARTICIPATION LIMITATIONS: meal prep, cleaning, laundry, driving,  and shopping  PERSONAL FACTORS: Age and 1-2 comorbidities: HTN, sciatica, lumbar radiculopathy and chronic back pain are also affecting patient's functional outcome.   REHAB POTENTIAL: Good  CLINICAL DECISION MAKING: Evolving/moderate complexity  EVALUATION COMPLEXITY: Moderate   GOALS: Goals reviewed with patient? Yes  SHORT TERM GOALS: Target date: 09/04/2024  Patient will be independent with initial HEP to improve outcomes and carryover.  Baseline: 100% PT assist required for correct completion Goal status: PARTIALLY MET- independent with most exercises but needs cues with scapular retraction 07/17/24  2.  Patient will report 25% improvement in R shoulder pain to improve QOL.   Baseline: 4/10  8/18:  2/10 Goal status: MET  3.   Patient will demonstrate R shoulder ROM (within protocol limitations) of 140 degrees flexion/scaption/abduction, 40 degrees ER, and IR to R buttock Baseline: see ROM table above Goal status: IN PROGRESS- 07/23/24 progressing, limited to 90 deg flexion as of now, no abduction or ER more than 30 deg  4.  Patient will demonstrate 3+/5 R shoulder strength for ability to do haircare, self hygiene, reaching above head  Baseline:  strength NT due to surgical precautions  Goal Status:  INITIAL LONG TERM GOALS: Target date: .wplus1  Patient will be independent with ongoing/advanced HEP for self-management at home.  Baseline: no advanced HEP Yet Goal status: INITIAL  2.  Patient will report 50-75% improvement in R shoulder pain to improve QOL.  Baseline: 4/10 Goal status: INITIAL  4.  Patient to improve R shoulder AROM to WNL without pain provocation to allow for increased ease of ADLs.  Baseline: Refer to above UE ROM table Goal status: INITIAL  5.  Patient will demonstrate improved R shoulder strength to >/= 5/5 for functional UE use. Baseline: Refer to above UE MMT table Goal status: INITIAL  6  Patient will report </= 40 % on QuickDASH (MCID = 14%) to demonstrate improved functional ability.  Baseline: 81.8% Goal status: INITIAL  7.  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   Baseline: not allowed due to surgical restrictions Goal status: INITIAL   PLAN:  PT FREQUENCY: 1-2x/week  PT DURATION: other: 16 weeks  PLANNED INTERVENTIONS: 97164- PT Re-evaluation, 97750- Physical Performance Testing, 97110-Therapeutic exercises, 97530- Therapeutic activity, 97112- Neuromuscular re-education, 97535- Self Care, 02859- Manual therapy, [id number]- Electrical stimulation (unattended), 97016- Vasopneumatic device, [id number]- Ultrasound, 79439 (1-2 muscles), 20561 (3+ muscles)- Dry Needling, Patient/Family education, Taping, Joint  mobilization, Scar mobilization, Cryotherapy, and Moist heat  PLAN FOR NEXT SESSION:  Continue with PROM progression following ortho protocol, vaso for pain and edema  Phillip Hughes, PT 07/30/2024, 9:13 PM  Date of referral: 06/06/24 Referring provider: Duwaine Sharper Referring diagnosis? 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 (if different than referring diagnosis) R shoulder pain, R shoulder weakness, R shoulder stiffness  What was this (referring dx) caused by? Surgery (Type: R RTC repair, SAD/DCR)  [place] of Condition: Initial Onset (within last 3 months)   Laterality: Rt  Current Functional Measure Score: DASH Quick Dash = 81.8/10  Objective measurements identify impairments when they are compared to normal values, the uninvolved extremity, and prior level of function.  [x]  Yes  []  No PROM R shoulder:  75 deg flexion; 60 deg ABD, 10 deg ER  Objective assessment of functional ability: Severe functional limitations  Briefly describe symptoms: New R RTC repair with SAD/DCR on 07/05/24; unable to use or move his R shoulder  How did symptoms start: surgery   Average pain intensity:  Last 24 hours: 4  Past week: 6  How often does the pt experience symptoms? Constantly  How much have the symptoms interfered with usual daily activities? Extremely  How has condition changed since care began at this facility? NA - initial visit  In general, how is the patients overall health? Good   BACK PAIN (STarT Back Screening Tool) No

## 2024-08-02 ENCOUNTER — Encounter: Admitting: Rehabilitation

## 2024-08-06 ENCOUNTER — Ambulatory Visit: Admitting: Rehabilitation

## 2024-08-06 DIAGNOSIS — M25511 Pain in right shoulder: Secondary | ICD-10-CM

## 2024-08-06 DIAGNOSIS — M25611 Stiffness of right shoulder, not elsewhere classified: Secondary | ICD-10-CM | POA: Diagnosis not present

## 2024-08-06 DIAGNOSIS — M6281 Muscle weakness (generalized): Secondary | ICD-10-CM

## 2024-08-06 DIAGNOSIS — M5441 Lumbago with sciatica, right side: Secondary | ICD-10-CM | POA: Diagnosis not present

## 2024-08-06 DIAGNOSIS — G8929 Other chronic pain: Secondary | ICD-10-CM

## 2024-08-06 DIAGNOSIS — R252 Cramp and spasm: Secondary | ICD-10-CM

## 2024-08-06 NOTE — Therapy (Signed)
 OUTPATIENT PHYSICAL THERAPY SHOULDER TREATMENT   Patient Name: Phillip Hughes MRN: [id number] DOB:October 11, 1956, 68 y.o., male Today's Date: 08/06/2024   END OF SESSION:  PT End of Session - 08/06/24 0802     Visit Number 6    Date for PT Re-Evaluation 10/30/24    Authorization Type [employer] MCR    PT Start Time 0800    PT Stop Time 0900    PT Time Calculation (min) 60 min    Activity Tolerance Patient tolerated treatment well    Behavior During Therapy WFL for tasks assessed/performed             Past Medical History:  Diagnosis Date   Hypertension    Sciatica    Sinusitis    Past Surgical History:  Procedure Laterality Date   BACK SURGERY     HERNIA REPAIR     HIP SURGERY     NECK SURGERY     Patient Active Problem List   Diagnosis Date Noted   Hx of total hip arthroplasty, right 02/28/2023   Degenerative tear of medial meniscus of right knee 12/01/2022   Lumbar radiculopathy 11/29/2022   Peroneal mononeuropathy, 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    PCP: Dorina Loving, PA-C   REFERRING PROVIDER: Duwaine Ozell PARAS., MD   REFERRING DIAG: Tear of R rotator cuff, unspecified tear extent, unspecified whether traumatic (M75.101); 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); Subacromial bursitis R shoulder (M75.51); Chronic R shoulder pain (M25.511, G89.29)  THERAPY DIAG:  Acute pain of right shoulder  Muscle weakness (generalized)  Stiffness of right shoulder, not elsewhere classified  Chronic right shoulder pain  Chronic right-sided low back pain with right-sided sciatica  Cramp and spasm  RATIONALE FOR EVALUATION AND TREATMENT: Rehabilitation  ONSET DATE: 07/05/54  NEXT MD VISIT: 08/15/24   SUBJECTIVE:                                                                                                                                                                                                         SUBJECTIVE STATEMENT: States shoulder is feeling better.  Only 2/10 pain today.  States  back/leg pain and leg weakness/paresis are his biggest c/o at this point.  States probably going to need back surgery after his shoulder is recovered   PAIN: Are you having pain? Yes: NPRS scale: 3/10 now and worst Pain location: R shoulder Pain description: throbbing Aggravating factors: any movements Relieving factors: rest, ice, meds  PERTINENT HISTORY:  R THA, Back surgery 2017, lumbar radiculopathy, Neck surgery, HTN  PRECAUTIONS: Shoulder RTC protocol precautions for Dr Duwaine  RED FLAGS: None  HAND DOMINANCE: Left  WEIGHT BEARING RESTRICTIONS: Yes NWB RUE  FALLS:  Has patient fallen in last 6 months? 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;  would like to return to playing golf (hasn't done since 2023)   OBJECTIVE: (objective measures completed at initial evaluation unless otherwise dated)  PATIENT SURVEYS:  Quick Dash = 81.8/100  COGNITION: Overall cognitive status: Within functional limits for tasks assessed     SENSATION: WFL  POSTURE: No Significant postural limitations  UPPER EXTREMITY ROM:   Passive ROM Right eval Left eval R 07/23/24 RUE 08/06/24  Shoulder flexion 75 180 90 120  Shoulder extension NT 55    Shoulder abduction  175  100  Shoulder adduction      Shoulder internal rotation Hand to stomach in sling 75    Shoulder external rotation 10 80 10 32  Elbow flexion WNL = BUE     Elbow extension      Wrist flexion      Wrist extension      Wrist ulnar deviation      Wrist radial deviation      Wrist pronation      Wrist supination      (Blank rows = not tested)  UPPER EXTREMITY MMT:  07/10/24--No strength testing done at eval due to surgical precautions  MMT Right eval Left eval  Shoulder flexion    Shoulder extension    Shoulder abduction    Shoulder adduction    Shoulder internal rotation    Shoulder external rotation    Middle trapezius    Lower trapezius    Elbow flexion    Elbow extension    Wrist flexion    Wrist extension    Wrist ulnar deviation    Wrist radial deviation    Wrist pronation    Wrist supination    Grip strength (lbs)    (Blank rows = not tested)  SHOULDER SPECIAL TESTS:  N/A   JOINT MOBILITY TESTING:  N/A  PALPATION:  TTP over the anterior lateral deltoid area and upper traps    TODAY'S TREATMENT:  08/06/24 THERAPEUTIC EXERCISE: To improve ROM.  Demonstration, verbal and tactile cues throughout for technique. PROM L shoulder x 30 minutes in all planes within precautionary ROM per [facility] protocol faxed by Dr Duwaine Shoulder circles F/B x 30 Elbow flex/ext x 30 AROM Wrist flex/ext x 30 RUE Forearm pronate/supinate x 30 RUE Ball squeeze x 30 RUE Standing isometric extension, ER, and punch/press x 30 into PT hand at wall with PT assist for submax contraction  MODALITIES:   Vasopneumatic x 15' to R shoulder seated at min compression  07/30/24 THERAPEUTIC ACTIVITIES: To improve functional performance.  Demonstration, verbal and tactile cues throughout for technique. Shoulder shrugs x 30 BUE Shoulder rolls F x 30 BUE;  B x 30 BUE Scap retraction x 20 BUE Elbow AAROM with contralateral UE x 30 RUE Forearm pronate/supination x 30 Rue Wrist flex/ext AROM  x 30 RUE Ball squeeze attempted, but increased wrist pain at carpel tunnel so this is withheld til next visit Sub max isometrics in sling for shoulder extension x 30 RUE  MANUAL THERAPY: To  promote increased ROM utilizing PROM. PROM R shoulder in all planes;  ER to 30 degrees in neutral, 45 deg scaption, and 90 deg scaption;  flexion/abd to 90-100 degrees limited from further by  pain; circumduction within precaution/protocol ROM   gentle R shoulder joint oscillatations at neutral  Modalities:   Vasopneumatic x 15' to R shoulder seated at min compression   07/23/24 NEUROMUSCULAR RE-EDUCATION: To improve posture  Scap retraction 2x10 Shoulder rolls 2x10 Assisted thoracic extension in sling x 10  Manual Therapy:  STM to B UT, LS - cues to relax shoulders  THERAPEUTIC EXERCISE: To improve strength, endurance, ROM, and flexibility.  PROM to R shoulder flexion, abd, ER per patient protocol Sub max isometrics; shoulder flexion, ER, ext, abd in sling 5x5  Vaso to R shoulder low compression x 15 min  07/17/24 NEUROMUSCULAR RE-EDUCATION: To improve posture  Scap retraction 2x10 Shoulder rolls 2x10  THERAPEUTIC EXERCISE: To improve strength, endurance, ROM, and flexibility.  Grip squeezes x 20 in sling Seated wrist flexion and extension x 20 Seated elbow flexion with pillow support x 20 PROM to R shoulder not past 90 deg,very small ER PROM Education on precautions and HEP with patient and spouse  07/12/24 Seated:  -Open and close fist x 20  - forearm pronation/supination x 20  -Seated shoulder rolls x 20  -seated shrugs x 20  - seated ball squeezes x 20 PROM to R shoulder very gentle adhering to protocol precautions  SELF CARE: Provided education on strategies for edema management and how to don/doff sling with assistance; using pillow under RUE when lying on back for support and extension prevention; icing 15-20 min every 2 hrs; how to safely put on shirts without lifting RUE.  PATIENT EDUCATION:  Education details: isometrics review Person educated: Patient Education method: Explanation, Demonstration, Verbal cues, Tactile cues, and Handouts Education comprehension: verbalized understanding, verbal cues required, tactile cues required, and needs further education  HOME EXERCISE PROGRAM: Access Code: [email or web address] URL:  [email or web address] Date: 07/23/2024 Prepared by: Braylin Clark  Exercises - Seated Shoulder Shrugs  - 1 x daily - 7 x weekly - 3 sets - 10 reps - Seated Shoulder Shrug Circles AROM Forward  - 1 x daily - 7 x weekly - 3 sets - 10 reps - Seated Elbow Flexion AAROM  - 1 x daily - 7 x weekly - 3 sets - 10 reps - Forearm Pronation and Supination With Shoulder Sling  - 1 x daily - 7 x weekly - 3 sets - 10 reps - Wrist Flexion and Extension With Shoulder Sling  - 1 x daily - 7 x weekly - 3 sets - 10 reps - Ball Squeeze With Shoulder Sling  - 1 x daily - 7 x weekly - 3 sets - 10 reps - Isometric Shoulder Extension with Ball at [employer]  - 1 x daily - 7 x weekly - 3 sets - 5 reps - 5 sec hold - Isometric Shoulder Flexion  - 1 x daily - 7 x weekly - 3 sets - 5 reps - 5 sec hold - Isometric Shoulder External Rotation  - 1 x daily - 7 x weekly - 3 sets - 5 reps - 5 sec hold - Standing Isometric Shoulder Abduction with Doorway - Arm Bent  - 1 x daily - 7 x weekly - 3 sets - 5 reps - 5 sec hold   ASSESSMENT:  CLINICAL IMPRESSION:  Focus today mostly on PROM R shoulder.  He is able to achieve  120 degrees of flexion/scaption.  He took a pain pill last night due to his back and RLE pain.  He feels this is probably helping him not to guard his shoulder as much with PROM today and he is much looser.  He will f/u with Dr Duwaine next week.    OBJECTIVE IMPAIRMENTS: decreased ROM, decreased strength, decreased safety awareness, increased edema, increased muscle spasms, impaired flexibility, impaired UE functional use, and pain.   ACTIVITY LIMITATIONS: carrying, lifting, sleeping, bathing, toileting, dressing, self feeding, reach over head, and hygiene/grooming  PARTICIPATION LIMITATIONS: meal prep, cleaning, laundry, driving, and shopping  PERSONAL FACTORS: Age and 1-2 comorbidities: HTN, sciatica, lumbar radiculopathy and chronic back pain are also affecting patient's functional outcome.   REHAB  POTENTIAL: Good  CLINICAL DECISION MAKING: Evolving/moderate complexity  EVALUATION COMPLEXITY: Moderate   GOALS: Goals reviewed with patient? Yes  SHORT TERM GOALS: Target date: 09/04/2024  Patient will be independent with initial HEP to improve outcomes and carryover.  Baseline: 100% PT assist required for correct completion Goal status: MET- independent with most exercises but needs cues with scapular retraction 07/17/24;  08/06/24 independent  2.  Patient will report 25% improvement in R shoulder pain to improve QOL.   Baseline: 4/10  8/18:  2/10 Goal status: MET  3.  Patient will demonstrate R shoulder ROM (within protocol limitations) of 140 degrees flexion/scaption/abduction, 40 degrees ER, and IR to R buttock Baseline: see ROM table above 08/06/24:  reassessed.  See ROM tables above Goal status: IN PROGRESS-   4.  Patient will demonstrate 3+/5 R shoulder strength for ability to do haircare, self hygiene, reaching above head  Baseline:  strength NT due to surgical precautions  Goal Status:  INITIAL  LONG TERM GOALS: Target date: .wplus1 (10/05/24  Patient will be independent with ongoing/advanced HEP for self-management at home.  Baseline: no advanced HEP Yet Goal status: INITIAL  2.  Patient will report 50-75% improvement in R shoulder pain to improve QOL.  Baseline: 4/10 Goal status: INITIAL  4.  Patient to improve R shoulder AROM to WNL without pain provocation to allow for increased ease of ADLs.  Baseline: Refer to above UE ROM table Goal status: INITIAL  5.  Patient will demonstrate improved R shoulder strength to >/= 5/5 for functional UE use. Baseline: Refer to above UE MMT table Goal status: INITIAL  6  Patient will report </= 40 % on QuickDASH (MCID = 14%) to demonstrate improved functional ability.  Baseline: 81.8% Goal status: INITIAL  7.  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    Baseline: not allowed due to surgical restrictions Goal status: INITIAL   PLAN:  PT FREQUENCY: 1-2x/week  PT DURATION: other: 16 weeks  PLANNED INTERVENTIONS: 97164- PT Re-evaluation, 97750- Physical Performance Testing, 97110-Therapeutic exercises, 97530- Therapeutic activity, 97112- Neuromuscular re-education, 97535- Self Care, 02859- Manual therapy, G0283- Electrical stimulation (unattended), 97016- Vasopneumatic device, [id number]- Ultrasound, 79439 (1-2 muscles), 20561 (3+ muscles)- Dry Needling, Patient/Family education, Taping, Joint mobilization, Scar mobilization, Cryotherapy, and Moist heat  PLAN FOR NEXT SESSION:  Continue to work toward improving R shoulder external rotation and elevation within ROM precautions;  MD office visit next week  Kenadi Miltner, PT 08/06/2024, 8:25 PM  Date of referral: 06/06/24 Referring provider: Duwaine Sharper Referring diagnosis? 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 (if different than referring diagnosis) R shoulder pain, R shoulder weakness, R shoulder stiffness  What was this (referring dx) caused by? Surgery (Type: R RTC repair, SAD/DCR)  Lysle of Condition: Initial Onset (within last 3 months)   Laterality: Rt  Current Functional Measure Score: DASH Quick Dash = 81.8/10  Objective measurements identify impairments when they are compared to normal values, the uninvolved extremity, and prior level of function.  [x]  Yes  []  No PROM R shoulder:  75 deg flexion; 60 deg ABD, 10 deg ER  Objective assessment of functional ability: Severe functional limitations   Briefly describe symptoms: New R RTC repair with SAD/DCR on 07/05/24; unable to use or move his R shoulder  How did symptoms start: surgery   Average pain intensity:  Last 24 hours: 4  Past  week: 6  How often does the pt experience symptoms? Constantly  How much have the symptoms interfered with usual daily activities? Extremely  How has condition changed since care began at this facility? NA - initial visit  In general, how is the patients overall health? Good   BACK PAIN (STarT Back Screening Tool) No

## 2024-08-09 ENCOUNTER — Encounter: Admitting: Rehabilitation

## 2024-08-14 ENCOUNTER — Ambulatory Visit: Attending: Sports Medicine | Admitting: Rehabilitation

## 2024-08-14 DIAGNOSIS — M25611 Stiffness of right shoulder, not elsewhere classified: Secondary | ICD-10-CM | POA: Diagnosis not present

## 2024-08-14 DIAGNOSIS — G8929 Other chronic pain: Secondary | ICD-10-CM | POA: Diagnosis not present

## 2024-08-14 DIAGNOSIS — M6281 Muscle weakness (generalized): Secondary | ICD-10-CM | POA: Diagnosis not present

## 2024-08-14 DIAGNOSIS — M25511 Pain in right shoulder: Secondary | ICD-10-CM | POA: Diagnosis not present

## 2024-08-14 NOTE — Therapy (Signed)
 OUTPATIENT PHYSICAL THERAPY SHOULDER TREATMENT / DOCTOR PROGRESS NOTE   Patient Name: Phillip Hughes MRN: [id number] DOB:January 31, 1956, 68 y.o., male Today's Date: 08/14/2024   END OF SESSION:  PT End of Session - 08/14/24 0801     Visit Number 7    Date for PT Re-Evaluation 10/30/24    Authorization Type [employer] MCR    PT Start Time 0758    PT Stop Time 0900    PT Time Calculation (min) 62 min    Activity Tolerance Patient tolerated treatment well    Behavior During Therapy WFL for tasks assessed/performed             Past Medical History:  Diagnosis Date   Hypertension    Sciatica    Sinusitis    Past Surgical History:  Procedure Laterality Date   BACK SURGERY     HERNIA REPAIR     HIP SURGERY     NECK SURGERY     Patient Active Problem List   Diagnosis Date Noted   Hx of total hip arthroplasty, right 02/28/2023   Degenerative tear of medial meniscus of right knee 12/01/2022   Lumbar radiculopathy 11/29/2022   Peroneal mononeuropathy, 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    PCP: Dorina Loving, PA-C   REFERRING PROVIDER: Duwaine Ozell PARAS., MD   REFERRING DIAG: Tear of R rotator cuff, unspecified tear extent, unspecified whether traumatic (M75.101); 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  Denies any shoulder pain at this time.   States his back pain is feeling better today too.    PAIN: Are you having pain? Yes: NPRS  scale: 3/10 now and worst Pain location: R shoulder Pain description: throbbing Aggravating factors: any movements Relieving factors: rest, ice, meds  PERTINENT HISTORY:  R THA, Back surgery 2017, lumbar radiculopathy, Neck surgery, HTN  PRECAUTIONS: Shoulder RTC protocol precautions for Dr Duwaine  RED FLAGS: None  HAND DOMINANCE: Left  WEIGHT BEARING RESTRICTIONS: Yes NWB RUE  FALLS:  Has patient fallen in last 6 months? 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;  would like to return to playing golf (hasn't done since 2023)   OBJECTIVE: (objective measures completed at initial evaluation unless otherwise dated)  PATIENT SURVEYS:  Quick Dash = 81.8/100  COGNITION: Overall cognitive status: Within functional limits for tasks assessed     SENSATION: WFL  POSTURE: No Significant postural limitations  UPPER EXTREMITY ROM:   Passive ROM Right eval Left eval R 07/23/24 RUE 08/06/24 RUE 08/14/24  Shoulder flexion 75 180 90 120 130  Shoulder extension NT 55     Shoulder abduction  175  100 110  Shoulder adduction       Shoulder internal rotation Hand to stomach in sling 75   Hand to stomach;  30 degrees at 40 deg scaption  Shoulder external rotation 10 80 10 32 37  Elbow flexion WNL = BUE      Elbow extension       Wrist flexion       Wrist extension       Wrist ulnar deviation       Wrist radial deviation       Wrist pronation       Wrist supination       (Blank rows = not tested)  UPPER EXTREMITY MMT: 07/10/24--No strength testing done at eval due to surgical precautions  MMT Right eval Left eval  Shoulder flexion     Shoulder extension    Shoulder abduction    Shoulder adduction    Shoulder internal rotation    Shoulder external rotation    Middle trapezius    Lower trapezius    Elbow flexion    Elbow extension    Wrist flexion    Wrist extension    Wrist ulnar deviation    Wrist radial deviation    Wrist pronation    Wrist supination    Grip strength (lbs)    (Blank rows = not tested)  SHOULDER SPECIAL TESTS:  N/A   JOINT MOBILITY TESTING:  N/A  PALPATION:  TTP over the anterior lateral deltoid area and upper traps    TODAY'S TREATMENT:  08/14/24: THERAPEUTIC EXERCISE: To improve ROM.  Demonstration, verbal and tactile cues throughout for technique. PROM R shoulder in supine in all planes x with intermittent grade 1-2 gentle oscillations x 26' ROM checked (see above table) Shoulder shrugs x 30 BUE Shoulder circles F/B x 30 each direction Elbow flex/ext x 30 AROM RUE Forearm pronation/supination x 30 RUE Wrist flex/ext x 30 RUE Hand grip/squeeze x 30 RUE Standing shoulder submaximal isometrics at wall for bent elbow shoulder extension, bent elbow chest press, bent elbow IR x 30 RUE (with PT hand under patient's elbow for pressure monitoring)  MODALITIES:   Vasopneumatic x 15' to R shoulder seated at min compression  08/06/24 THERAPEUTIC EXERCISE: To improve ROM.  Demonstration, verbal and tactile cues throughout for technique. PROM R shoulder x 30 minutes in all planes within precautionary ROM per [facility] protocol faxed by Dr Duwaine Shoulder shrugs x 30 BUE Shoulder circles F/B x 30 Elbow flex/ext x 30 AROM Wrist flex/ext x 30 RUE Forearm pronate/supinate x 30 RUE Ball squeeze x 30 RUE Standing isometric extension, ER, and  punch/press x 30 into PT hand at wall with PT assist for submax contraction  MODALITIES:   Vasopneumatic x 15' to R shoulder seated at min compression  PATIENT EDUCATION:  Education details: isometrics review Person educated: Patient Education method:  Explanation, Demonstration, Verbal cues, Tactile cues, and Handouts Education comprehension: verbalized understanding, verbal cues required, tactile cues required, and needs further education  HOME EXERCISE PROGRAM: Access Code: [email or web address] URL: [email or web address] Date: 07/23/2024 Prepared by: Sol Gaskins  Exercises - Seated Shoulder Shrugs  - 1 x daily - 7 x weekly - 3 sets - 10 reps - Seated Shoulder Shrug Circles AROM Forward  - 1 x daily - 7 x weekly - 3 sets - 10 reps - Seated Elbow Flexion AAROM  - 1 x daily - 7 x weekly - 3 sets - 10 reps - Forearm Pronation and Supination With Shoulder Sling  - 1 x daily - 7 x weekly - 3 sets - 10 reps - Wrist Flexion and Extension With Shoulder Sling  - 1 x daily - 7 x weekly - 3 sets - 10 reps - Ball Squeeze With Shoulder Sling  - 1 x daily - 7 x weekly - 3 sets - 10 reps - Isometric Shoulder Extension with Ball at [employer]  - 1 x daily - 7 x weekly - 3 sets - 5 reps - 5 sec hold - Isometric Shoulder Flexion  - 1 x daily - 7 x weekly - 3 sets - 5 reps - 5 sec hold - Isometric Shoulder External Rotation  - 1 x daily - 7 x weekly - 3 sets - 5 reps - 5 sec hold - Standing Isometric Shoulder Abduction with Doorway - Arm Bent  - 1 x daily - 7 x weekly - 3 sets - 5 reps - 5 sec hold   ASSESSMENT:  CLINICAL IMPRESSION:  Patient is 5.5 weeks s/p R supraspinatus repair by Dr Duwaine.  He is wearing his sling all times per faxed [facility] RCR protocol.   He is doing his scapular, elbow, wrist/hand exercises per written home program.  He is having no pain in the R shoulder.   His incisions are all well healed.  There is no redness or sign of infection.  He will f/u with Dr Duwaine tomorrow, and we anticipate progression to AAROM/AROM next week unless ortho recommends otherwise.   Will plan to progress per protocol next week.  OBJECTIVE IMPAIRMENTS: decreased ROM, decreased strength, decreased safety awareness, increased edema, increased muscle spasms,  impaired flexibility, impaired UE functional use, and pain.   ACTIVITY LIMITATIONS: carrying, lifting, sleeping, bathing, toileting, dressing, self feeding, reach over head, and hygiene/grooming  PARTICIPATION LIMITATIONS: meal prep, cleaning, laundry, driving, and shopping  PERSONAL FACTORS: Age and 1-2 comorbidities: HTN, sciatica, lumbar radiculopathy and chronic back pain are also affecting patient's functional outcome.   REHAB POTENTIAL: Good  CLINICAL DECISION MAKING: Evolving/moderate complexity  EVALUATION COMPLEXITY: Moderate   GOALS: Goals reviewed with patient? Yes  SHORT TERM GOALS: Target date: 09/04/2024  Patient will be independent with initial HEP to improve outcomes and carryover.  Baseline: 100% PT assist required for correct completion Goal status: MET- independent with most exercises but needs cues with scapular retraction 07/17/24;  08/06/24 independent  2.  Patient will report 25% improvement in R shoulder pain to improve QOL.   Baseline: 4/10  8/18:  2/10 Goal status: MET  3.  Patient will demonstrate R shoulder ROM (within protocol limitations) of 140 degrees flexion/scaption/abduction, 40 degrees ER,  and IR to R buttock Baseline: see ROM table above 08/06/24:  reassessed.  See ROM tables above Goal status: IN PROGRESS-   4.  Patient will demonstrate 3+/5 R shoulder strength for ability to do haircare, self hygiene, reaching above head  Baseline:  strength NT due to surgical precautions  Goal Status:  INITIAL  LONG TERM GOALS: Target date: .wplus1 (10/05/24  Patient will be independent with ongoing/advanced HEP for self-management at home.  Baseline: no advanced HEP Yet Goal status: INITIAL  2.  Patient will report 50-75% improvement in R shoulder pain to improve QOL.  Baseline: 9/2:  0/10 today at rest;  2/10 with PROM Goal status: MET  4.  Patient to improve R shoulder AROM to WNL without pain provocation to allow for increased ease of ADLs.   Baseline: Refer to above UE ROM table Goal status: INITIAL  5.  Patient will demonstrate improved R shoulder strength to >/= 5/5 for functional UE use. Baseline: Refer to above UE MMT table Goal status: INITIAL  6  Patient will report </= 40 % on QuickDASH (MCID = 14%) to demonstrate improved functional ability.  Baseline: 81.8% Goal status: INITIAL  7.  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   Baseline: not allowed due to surgical restrictions Goal status: INITIAL   PLAN:  PT FREQUENCY: 1-2x/week  PT DURATION: other: 16 weeks  PLANNED INTERVENTIONS: 97164- PT Re-evaluation, 97750- Physical Performance Testing, 97110-Therapeutic exercises, 97530- Therapeutic activity, 97112- Neuromuscular re-education, 97535- Self Care, 02859- Manual therapy, [id number]- Electrical stimulation (unattended), 97016- Vasopneumatic device, [id number]- Ultrasound, 79439 (1-2 muscles), 20561 (3+ muscles)- Dry Needling, Patient/Family education, Taping, Joint mobilization, Scar mobilization, Cryotherapy, and Moist heat  PLAN FOR NEXT SESSION:  Check to see what Dr Duwaine says after appointment tomorrow.  Progress with RCR protocol for week 6 unless ortho recommends otherwise.     Merranda Bolls, PT 08/14/2024, 8:55 AM  Date of referral: 06/06/24 Referring provider: Duwaine Sharper Referring diagnosis? 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 (if different than referring diagnosis) R shoulder pain, R shoulder weakness, R shoulder stiffness  What was this (referring dx) caused by? Surgery (Type: R RTC repair, SAD/DCR)  [place] of Condition: Initial Onset (within last 3 months)   Laterality: Rt  Current Functional Measure Score: DASH Quick Dash =  81.8/10  Objective measurements identify impairments when they are compared to normal values, the uninvolved extremity, and prior level of function.  [x]  Yes  []  No PROM R shoulder:  75 deg flexion; 60 deg ABD, 10 deg ER  Objective assessment of functional ability: Severe functional limitations   Briefly describe symptoms: New R RTC repair with SAD/DCR on 07/05/24; unable to use or move his R shoulder  How did symptoms start: surgery   Average pain intensity:  Last 24 hours: 4  Past week: 6  How often does the pt experience symptoms? Constantly  How much have the symptoms interfered with usual daily activities? Extremely  How has condition changed since care began at this facility? NA - initial visit  In general, how is the patients overall health? Good   BACK PAIN (STarT Back Screening Tool) No

## 2024-08-18 DIAGNOSIS — M5416 Radiculopathy, lumbar region: Secondary | ICD-10-CM | POA: Diagnosis not present

## 2024-08-21 ENCOUNTER — Ambulatory Visit: Admitting: Rehabilitation

## 2024-08-21 DIAGNOSIS — M25511 Pain in right shoulder: Secondary | ICD-10-CM | POA: Diagnosis not present

## 2024-08-21 DIAGNOSIS — M6281 Muscle weakness (generalized): Secondary | ICD-10-CM

## 2024-08-21 DIAGNOSIS — G8929 Other chronic pain: Secondary | ICD-10-CM | POA: Diagnosis not present

## 2024-08-21 DIAGNOSIS — M25611 Stiffness of right shoulder, not elsewhere classified: Secondary | ICD-10-CM

## 2024-08-21 NOTE — Therapy (Signed)
 OUTPATIENT PHYSICAL THERAPY SHOULDER TREATMENT    Patient Name: Phillip Hughes MRN: [id number] DOB:10/23/1956, 68 y.o., male Today's Date: 08/21/2024   END OF SESSION:  PT End of Session - 08/21/24 0931     Visit Number 8    Date for PT Re-Evaluation 10/30/24    Authorization Type [employer] MCR    PT Start Time 0922    PT Stop Time 1025    PT Time Calculation (min) 63 min    Activity Tolerance Patient tolerated treatment well    Behavior During Therapy WFL for tasks assessed/performed             Past Medical History:  Diagnosis Date   Hypertension    Sciatica    Sinusitis    Past Surgical History:  Procedure Laterality Date   BACK SURGERY     HERNIA REPAIR     HIP SURGERY     NECK SURGERY     Patient Active Problem List   Diagnosis Date Noted   Hx of total hip arthroplasty, right 02/28/2023   Degenerative tear of medial meniscus of right knee 12/01/2022   Lumbar radiculopathy 11/29/2022   Peroneal mononeuropathy, 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    PCP: Dorina Loving, PA-C   REFERRING PROVIDER: Duwaine Ozell PARAS., MD   REFERRING DIAG: Tear of R rotator cuff, unspecified tear extent, unspecified whether traumatic (M75.101); 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); Subacromial bursitis R shoulder (M75.51); Chronic R shoulder pain (M25.511, G89.29)  THERAPY DIAG:  Acute pain of right shoulder  Muscle weakness (generalized)  Stiffness of right shoulder, not elsewhere classified  RATIONALE FOR EVALUATION AND TREATMENT: Rehabilitation  ONSET DATE: 07/05/54  NEXT MD VISIT: 08/15/24   SUBJECTIVE:                                                                                                                                                                                                         SUBJECTIVE STATEMENT: States saw Dr Duwaine last week and had sling D/C.  States has a paper from Dr Duwaine to give us , but he forgot it today.  Will bring next time  PAIN: Are you having pain? Yes:  NPRS scale: 3/10 now and worst Pain location: R shoulder Pain description: throbbing Aggravating factors: any movements Relieving factors: rest, ice, meds  PERTINENT HISTORY:  R THA, Back surgery 2017, lumbar radiculopathy, Neck surgery, HTN  PRECAUTIONS: Shoulder RTC protocol precautions for Dr Duwaine  RED FLAGS: None  HAND DOMINANCE: Left  WEIGHT BEARING RESTRICTIONS: Yes NWB RUE  FALLS:  Has patient fallen in last 6  months? 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;  would like to return to playing golf (hasn't done since 2023)   OBJECTIVE: (objective measures completed at initial evaluation unless otherwise dated)  PATIENT SURVEYS:  Quick Dash = 81.8/100  COGNITION: Overall cognitive status: Within functional limits for tasks assessed     SENSATION: WFL  POSTURE: No Significant postural limitations  UPPER EXTREMITY ROM:   Passive ROM Right eval Left eval R 07/23/24 RUE 08/06/24 RUE 08/14/24 RUE 08/21/24  Shoulder flexion 75 180 90 120 130 133  Shoulder extension NT 55      Shoulder abduction  175  100 110 105  Shoulder adduction        Shoulder internal rotation Hand to stomach in sling 75   Hand to stomach;  30 degrees at 40 deg scaption At 45 deg scaption: 45 degrees  Shoulder external rotation 10 80 10 32 37 At 45 degrees scaption:  44 degrees  Elbow flexion WNL = BUE       Elbow extension        Wrist flexion        Wrist extension        Wrist ulnar deviation        Wrist radial deviation        Wrist pronation        Wrist supination        (Blank rows = not tested)  UPPER EXTREMITY MMT: 07/10/24--No  strength testing done at eval due to surgical precautions  MMT Right eval Left eval  Shoulder flexion    Shoulder extension    Shoulder abduction    Shoulder adduction    Shoulder internal rotation    Shoulder external rotation    Middle trapezius    Lower trapezius    Elbow flexion    Elbow extension    Wrist flexion    Wrist extension    Wrist ulnar deviation    Wrist radial deviation    Wrist pronation    Wrist supination    Grip strength (lbs)    (Blank rows = not tested)  SHOULDER SPECIAL TESTS:  N/A   JOINT MOBILITY TESTING:  N/A  PALPATION:  TTP over the anterior lateral deltoid area and upper traps    TODAY'S TREATMENT:  08/20/24  THERAPEUTIC EXERCISE: To improve ROM.  Demonstration, verbal and tactile cues throughout for technique. UBE passive to [facility] of the RUE riding in the handle but not grasping L0 3'F/3'B Pulley flexion/scaption AAROM to 90 degrees x 2' PROM of the L shoulder x 20 min into flexion/scaption to 133 degrees;  ER/IR at 45 degrees scaption to 44 and 45 degrees respectively  THERAPEUTIC ACTIVITIES: To improve functional performance.  Demonstration, verbal and tactile cues throughout for techniqu Pendulums CW x 20; CCW x 20;  flex/ext x 20 Bent over rowing to hip/body 0 deg ext x 20 RUE Supine active ER at neutral position x 50 Supine wand ER stretch at neutral  Counter dust CW x 20; CCW x 20; F/B x 20  MODALITIES:   Vasopneumatic x 15' to R shoulder seated at min compression  08/14/24: THERAPEUTIC EXERCISE: To improve ROM.  Demonstration, verbal  and tactile cues throughout for technique. PROM R shoulder in supine in all planes x with intermittent grade 1-2 gentle oscillations x 26' ROM checked (see above table) Shoulder shrugs x 30 BUE Shoulder circles F/B x 30 each direction Elbow flex/ext x 30 AROM RUE Forearm pronation/supination x 30 RUE Wrist flex/ext x 30 RUE Hand grip/squeeze x 30 RUE Standing shoulder submaximal isometrics at  wall for bent elbow shoulder extension, bent elbow chest press, bent elbow IR x 30 RUE (with PT hand under patient's elbow for pressure monitoring)  MODALITIES:   Vasopneumatic x 15' to R shoulder seated at min compression  08/06/24 THERAPEUTIC EXERCISE: To improve ROM.  Demonstration, verbal and tactile cues throughout for technique. PROM R shoulder x 30 minutes in all planes within precautionary ROM per [facility] protocol faxed by Dr Duwaine Shoulder shrugs x 30 BUE Shoulder circles F/B x 30 Elbow flex/ext x 30 AROM Wrist flex/ext x 30 RUE Forearm pronate/supinate x 30 RUE Ball squeeze x 30 RUE Standing isometric extension, ER, and punch/press x 30 into PT hand at wall with PT assist for submax contraction  MODALITIES:   Vasopneumatic x 15' to R shoulder seated at min compression  PATIENT EDUCATION:  Education details: isometrics review Person educated: Patient Education method: Explanation, Demonstration, Verbal cues, Tactile cues, and Handouts Education comprehension: verbalized understanding, verbal cues required, tactile cues required, and needs further education  HOME EXERCISE PROGRAM: Access Code: [email or web address] URL: [email or web address] Date: 08/21/2024 Prepared by: Garnette Montclair  Exercises - Seated Shoulder Scaption AAROM with Pulley at Side  - 1 x daily - 7 x weekly - 3 sets - 10 reps - Circular Shoulder Pendulum with Table Support  - 1 x daily - 7 x weekly - 3 sets - 10 reps - Flexion-Extension Shoulder Pendulum with Table Support  - 1 x daily - 7 x weekly - 3 sets - 10 reps - Standing Bent Over Single Arm Scapular Row with Table Support  - 1 x daily - 7 x weekly - 3 sets - 10 reps - Seated Single Arm Shoulder External Rotation  - 1 x daily - 7 x weekly - 3 sets - 10 reps - Supine Shoulder External Rotation with Dowel  - 1 x daily - 7 x weekly - 3 sets - 10 reps - Supine Shoulder Flexion Extension AAROM with Dowel  - 1 x daily - 7 x weekly - 1 sets - 3 reps -  30 sec hold  ASSESSMENT:  CLINICAL IMPRESSION:  Saw ortho last week and had sling D/C.   He reports pain is minimal in the R shoulder.   He still has pain with PROM stretching though.   He is advanced per post op protocol for 6-8 weeks.  He tolerates all treatment today.   Needs continued work on ER ROM for maximum shoulder elevation ROM.   He is aware NWB RUE for now and to avoid any IR behind back or lifting of any weight.   ROM is still lacking for all motions and strength is NT today, but is <3+ for all motions.   PT remains necessary for ROM, weakness, lack of ADL ability with the RUE.   Continue per protocol  OBJECTIVE IMPAIRMENTS: decreased ROM, decreased strength, decreased safety awareness, increased edema, increased muscle spasms, impaired flexibility, impaired UE functional use, and pain.   ACTIVITY LIMITATIONS: carrying, lifting, sleeping, bathing, toileting, dressing, self feeding, reach over head, and hygiene/grooming  PARTICIPATION LIMITATIONS: meal prep, cleaning, laundry, driving, and shopping  PERSONAL FACTORS: Age and 1-2 comorbidities: HTN, sciatica, lumbar radiculopathy and chronic back pain are also affecting patient's functional outcome.   REHAB POTENTIAL: Good  CLINICAL DECISION MAKING: Evolving/moderate complexity  EVALUATION COMPLEXITY: Moderate   GOALS: Goals reviewed with patient? Yes  SHORT TERM GOALS: Target date: 09/04/2024  Patient will be independent with initial HEP to improve outcomes and carryover.  Baseline: 100% PT assist required for correct completion Goal status: MET- independent with most exercises but needs cues with scapular retraction 07/17/24;  08/06/24 independent  2.  Patient will report 25% improvement in R shoulder pain to improve QOL.   Baseline: 4/10  8/18:  2/10 Goal status: MET  3.  Patient will demonstrate R shoulder ROM (within protocol limitations) of 140 degrees flexion/scaption/abduction, 40 degrees ER, and IR to R  buttock Baseline: see ROM table above 08/06/24:  reassessed.  See ROM tables above Goal status: IN PROGRESS-   4.  Patient will demonstrate 3+/5 R shoulder strength for ability to do haircare, self hygiene, reaching above head  Baseline:  strength NT due to surgical precautions  Goal Status:  INITIAL  LONG TERM GOALS: Target date: .wplus1 (10/05/24  Patient will be independent with ongoing/advanced HEP for self-management at home.  Baseline: no advanced HEP Yet Goal status: INITIAL  2.  Patient will report 50-75% improvement in R shoulder pain to improve QOL.  Baseline: 9/2:  0/10 today at rest;  2/10 with PROM Goal status: MET  4.  Patient to improve R shoulder AROM to WNL without pain provocation to allow for increased ease of ADLs.  Baseline: Refer to above UE ROM table Goal status: INITIAL  5.  Patient will demonstrate improved R shoulder strength to >/= 5/5 for functional UE use. Baseline: Refer to above UE MMT table Goal status: INITIAL  6  Patient will report </= 40 % on QuickDASH (MCID = 14%) to demonstrate improved functional ability.  Baseline: 81.8% Goal status: INITIAL  7.  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   Baseline: not allowed due to surgical restrictions Goal status: INITIAL   PLAN:  PT FREQUENCY: 1-2x/week  PT DURATION: other: 16 weeks  PLANNED INTERVENTIONS: 97164- PT Re-evaluation, 97750- Physical Performance Testing, 97110-Therapeutic exercises, 97530- Therapeutic activity, [id number]- Neuromuscular re-education, 97535- Self Care, [id number]- Manual therapy, [id number]- Electrical stimulation (unattended), 97016- Vasopneumatic device, [id number]- Ultrasound, 79439 (1-2 muscles), 20561 (3+ muscles)- Dry Needling, Patient/Family education, Taping, Joint mobilization, Scar mobilization, Cryotherapy, and Moist heat  PLAN FOR NEXT SESSION:  7 weeks post op week of 9/15.   Patient to bring in written  instructions from Dr Duwaine.   Continue per POC    Quantarius Genrich, PT 08/21/2024, 9:36 PM  Date of referral: 06/06/24 Referring provider: Duwaine Sharper Referring diagnosis? 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 (if different than referring diagnosis) R shoulder pain, R shoulder weakness, R shoulder stiffness  What was this (referring dx) caused by? Surgery (Type: R RTC repair, SAD/DCR)  [place] of Condition: Initial Onset (within last 3 months)   Laterality: Rt  Current Functional Measure Score: DASH Quick Dash = 81.8/10  Objective measurements identify impairments when they are compared to normal values, the uninvolved extremity, and prior level of function.  [x]  Yes  []  No PROM R shoulder:  75 deg flexion; 60  deg ABD, 10 deg ER  Objective assessment of functional ability: Severe functional limitations   Briefly describe symptoms: New R RTC repair with SAD/DCR on 07/05/24; unable to use or move his R shoulder  How did symptoms start: surgery   Average pain intensity:  Last 24 hours: 4  Past week: 6  How often does the pt experience symptoms? Constantly  How much have the symptoms interfered with usual daily activities? Extremely  How has condition changed since care began at this facility? NA - initial visit  In general, how is the patients overall health? Good   BACK PAIN (STarT Back Screening Tool) No

## 2024-08-29 ENCOUNTER — Ambulatory Visit

## 2024-08-29 DIAGNOSIS — M25611 Stiffness of right shoulder, not elsewhere classified: Secondary | ICD-10-CM

## 2024-08-29 DIAGNOSIS — G8929 Other chronic pain: Secondary | ICD-10-CM | POA: Diagnosis not present

## 2024-08-29 DIAGNOSIS — M6281 Muscle weakness (generalized): Secondary | ICD-10-CM

## 2024-08-29 DIAGNOSIS — M25511 Pain in right shoulder: Secondary | ICD-10-CM | POA: Diagnosis not present

## 2024-08-29 NOTE — Therapy (Signed)
 OUTPATIENT PHYSICAL THERAPY SHOULDER TREATMENT    Patient Name: Phillip Hughes MRN: [id number] DOB:06-04-1956, 68 y.o., male Today's Date: 08/29/2024   END OF SESSION:  PT End of Session - 08/29/24 1022     Visit Number 9    Date for PT Re-Evaluation 10/30/24    Authorization Type [employer] MCR    PT Start Time 1018    PT Stop Time 1102    PT Time Calculation (min) 44 min    Activity Tolerance Patient tolerated treatment well    Behavior During Therapy WFL for tasks assessed/performed             Past Medical History:  Diagnosis Date   Hypertension    Sciatica    Sinusitis    Past Surgical History:  Procedure Laterality Date   BACK SURGERY     HERNIA REPAIR     HIP SURGERY     NECK SURGERY     Patient Active Problem List   Diagnosis Date Noted   Hx of total hip arthroplasty, right 02/28/2023   Degenerative tear of medial meniscus of right knee 12/01/2022   Lumbar radiculopathy 11/29/2022   Peroneal mononeuropathy, 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    PCP: Dorina Loving, PA-C   REFERRING PROVIDER: Duwaine Ozell PARAS., MD   REFERRING DIAG: Tear of R rotator cuff, unspecified tear extent, unspecified whether traumatic (M75.101); 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); Subacromial bursitis R shoulder (M75.51); Chronic R shoulder pain (M25.511, G89.29)  THERAPY DIAG:  Acute pain of right shoulder  Muscle weakness (generalized)  Stiffness of right shoulder, not elsewhere classified  RATIONALE FOR EVALUATION AND TREATMENT: Rehabilitation  ONSET DATE: 07/05/[birth year]  NEXT MD VISIT: 08/15/24   SUBJECTIVE:                                                                                                                                                                                                         SUBJECTIVE STATEMENT: Pt reports his R shoulder is sore that's all  PAIN: Are you having pain? Yes: NPRS scale: 4/10 now and worst Pain location: R shoulder Pain description:  throbbing Aggravating factors: any movements Relieving factors: rest, ice, meds  PERTINENT HISTORY:  R THA, Back surgery 2017, lumbar radiculopathy, Neck surgery, HTN  PRECAUTIONS: Shoulder RTC protocol precautions for Dr Duwaine  RED FLAGS: None  HAND DOMINANCE: Left  WEIGHT BEARING RESTRICTIONS: Yes NWB RUE  FALLS:  Has patient fallen in last 6 months? 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;  would like to return to playing golf (hasn't done since 2023)   OBJECTIVE: (objective measures completed at initial evaluation unless otherwise dated)  PATIENT SURVEYS:  Quick Dash = 81.8/100  COGNITION: Overall cognitive status: Within functional limits for tasks assessed     SENSATION: WFL  POSTURE: No Significant postural limitations  UPPER EXTREMITY ROM:   Passive ROM Right eval Left eval R 07/23/24 RUE 08/06/24 RUE 08/14/24 RUE 08/21/24 R 08/29/24  Shoulder flexion 75 180 90 120 130 133 143  Shoulder extension NT 55       Shoulder abduction  175  100 110 105 130  Shoulder adduction         Shoulder internal rotation Hand to stomach in sling 75   Hand to stomach;  30 degrees at 40 deg scaption At 45 deg scaption: 45 degrees 53  Shoulder external rotation 10 80 10 32 37 At 45 degrees scaption:  44 degrees   Elbow flexion WNL = BUE        Elbow extension         Wrist flexion         Wrist extension         Wrist ulnar deviation         Wrist radial deviation         Wrist pronation         Wrist supination         (Blank rows = not tested)  UPPER EXTREMITY MMT: 07/10/24--No strength testing done at eval due to  surgical precautions  MMT Right eval Left eval  Shoulder flexion    Shoulder extension    Shoulder abduction    Shoulder adduction    Shoulder internal rotation    Shoulder external rotation    Middle trapezius    Lower trapezius    Elbow flexion    Elbow extension    Wrist flexion    Wrist extension    Wrist ulnar deviation    Wrist radial deviation    Wrist pronation    Wrist supination    Grip strength (lbs)    (Blank rows = not tested)  SHOULDER SPECIAL TESTS:  N/A   JOINT MOBILITY TESTING:  N/A  PALPATION:  TTP over the anterior lateral deltoid area and upper traps    TODAY'S TREATMENT:  08/29/24 UBE passive to [facility] of the RUE riding in the handle but not grasping L0 x 6 min fwd Pulleys flexion/scaption PROM on R UE x 3 min Supine AROM R shoulder flexion x 10 below 90 Supine ER with wand 10x5; 2 sets PROM to R shoulder within tolerance and protocol limits  08/20/24  THERAPEUTIC EXERCISE: To improve ROM.  Demonstration, verbal and tactile cues throughout for technique. UBE passive to [facility] of the RUE riding in the handle but not grasping L0 3'F/3'B  Pulley flexion/scaption AAROM to 90 degrees x 2' PROM of the L shoulder x 20 min into flexion/scaption to 133 degrees;  ER/IR at 45 degrees scaption to 44 and 45 degrees respectively  THERAPEUTIC ACTIVITIES: To improve functional performance.  Demonstration, verbal and tactile cues throughout for techniqu Pendulums CW x 20; CCW x 20;  flex/ext x 20 Bent over rowing to hip/body 0 deg ext x 20 RUE  Supine active ER at neutral position x 50 Supine wand ER stretch at neutral  Counter dust CW x 20; CCW x 20; F/B x 20  MODALITIES:   Vasopneumatic x 15' to R shoulder seated at min compression  08/14/24: THERAPEUTIC EXERCISE: To improve ROM.  Demonstration, verbal and tactile cues throughout for technique. PROM R shoulder in supine in all planes x with intermittent grade 1-2 gentle oscillations x 26' ROM checked (see  above table) Shoulder shrugs x 30 BUE Shoulder circles F/B x 30 each direction Elbow flex/ext x 30 AROM RUE Forearm pronation/supination x 30 RUE Wrist flex/ext x 30 RUE Hand grip/squeeze x 30 RUE Standing shoulder submaximal isometrics at wall for bent elbow shoulder extension, bent elbow chest press, bent elbow IR x 30 RUE (with PT hand under patient's elbow for pressure monitoring)  MODALITIES:   Vasopneumatic x 15' to R shoulder seated at min compression  08/06/24 THERAPEUTIC EXERCISE: To improve ROM.  Demonstration, verbal and tactile cues throughout for technique. PROM R shoulder x 30 minutes in all planes within precautionary ROM per [facility] protocol faxed by Dr Duwaine Shoulder shrugs x 30 BUE Shoulder circles F/B x 30 Elbow flex/ext x 30 AROM Wrist flex/ext x 30 RUE Forearm pronate/supinate x 30 RUE Ball squeeze x 30 RUE Standing isometric extension, ER, and punch/press x 30 into PT hand at wall with PT assist for submax contraction  MODALITIES:   Vasopneumatic x 15' to R shoulder seated at min compression  PATIENT EDUCATION:  Education details: isometrics review Person educated: Patient Education method: Explanation, Demonstration, Verbal cues, Tactile cues, and Handouts Education comprehension: verbalized understanding, verbal cues required, tactile cues required, and needs further education  HOME EXERCISE PROGRAM: Access Code: [email or web address] URL: [email or web address] Date: 08/21/2024 Prepared by: Garnette Montclair  Exercises - Seated Shoulder Scaption AAROM with Pulley at Side  - 1 x daily - 7 x weekly - 3 sets - 10 reps - Circular Shoulder Pendulum with Table Support  - 1 x daily - 7 x weekly - 3 sets - 10 reps - Flexion-Extension Shoulder Pendulum with Table Support  - 1 x daily - 7 x weekly - 3 sets - 10 reps - Standing Bent Over Single Arm Scapular Row with Table Support  - 1 x daily - 7 x weekly - 3 sets - 10 reps - Seated Single Arm Shoulder External  Rotation  - 1 x daily - 7 x weekly - 3 sets - 10 reps - Supine Shoulder External Rotation with Dowel  - 1 x daily - 7 x weekly - 3 sets - 10 reps - Supine Shoulder Flexion Extension AAROM with Dowel  - 1 x daily - 7 x weekly - 1 sets - 3 reps - 30 sec hold  ASSESSMENT:  CLINICAL IMPRESSION:  Pt now 7 weeks out from RTC surgery. PROM is improving well but still has some tightness into end ranges. No pain from the session. Tactile feedback to stay relaxed and for form throughout session. PT remains necessary for ROM, weakness, lack of ADL ability with the RUE.   Continue per protocol  OBJECTIVE IMPAIRMENTS: decreased ROM, decreased strength, decreased safety awareness, increased edema, increased muscle spasms, impaired flexibility, impaired UE functional use, and pain.   ACTIVITY LIMITATIONS: carrying, lifting, sleeping, bathing, toileting, dressing, self feeding, reach over head, and hygiene/grooming  PARTICIPATION LIMITATIONS: meal prep, cleaning, laundry, driving, and shopping  PERSONAL FACTORS: Age and 1-2 comorbidities: HTN, sciatica, lumbar radiculopathy and chronic back pain are also affecting patient's  functional outcome.   REHAB POTENTIAL: Good  CLINICAL DECISION MAKING: Evolving/moderate complexity  EVALUATION COMPLEXITY: Moderate   GOALS: Goals reviewed with patient? Yes  SHORT TERM GOALS: Target date: 09/04/2024  Patient will be independent with initial HEP to improve outcomes and carryover.  Baseline: 100% PT assist required for correct completion Goal status: MET- independent with most exercises but needs cues with scapular retraction 07/17/24;  08/06/24 independent  2.  Patient will report 25% improvement in R shoulder pain to improve QOL.   Baseline: 4/10  8/18:  2/10 Goal status: MET  3.  Patient will demonstrate R shoulder ROM (within protocol limitations) of 140 degrees flexion/scaption/abduction, 40 degrees ER, and IR to R buttock Baseline: see ROM table  above 08/06/24:  reassessed.  See ROM tables above Goal status: IN PROGRESS-   4.  Patient will demonstrate 3+/5 R shoulder strength for ability to do haircare, self hygiene, reaching above head  Baseline:  strength NT due to surgical precautions  Goal Status:  INITIAL  LONG TERM GOALS: Target date: .wplus1 (10/05/24  Patient will be independent with ongoing/advanced HEP for self-management at home.  Baseline: no advanced HEP Yet Goal status: INITIAL  2.  Patient will report 50-75% improvement in R shoulder pain to improve QOL.  Baseline: 9/2:  0/10 today at rest;  2/10 with PROM Goal status: MET  4.  Patient to improve R shoulder AROM to WNL without pain provocation to allow for increased ease of ADLs.  Baseline: Refer to above UE ROM table Goal status: INITIAL  5.  Patient will demonstrate improved R shoulder strength to >/= 5/5 for functional UE use. Baseline: Refer to above UE MMT table Goal status: INITIAL  6  Patient will report </= 40 % on QuickDASH (MCID = 14%) to demonstrate improved functional ability.  Baseline: 81.8% Goal status: INITIAL  7.  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   Baseline: not allowed due to surgical restrictions Goal status: INITIAL   PLAN:  PT FREQUENCY: 1-2x/week  PT DURATION: other: 16 weeks  PLANNED INTERVENTIONS: 97164- PT Re-evaluation, 97750- Physical Performance Testing, 97110-Therapeutic exercises, 97530- Therapeutic activity, [id number]- Neuromuscular re-education, 97535- Self Care, 02859- Manual therapy, G0283- Electrical stimulation (unattended), 97016- Vasopneumatic device, [id number]- Ultrasound, 79439 (1-2 muscles), 20561 (3+ muscles)- Dry Needling, Patient/Family education, Taping, Joint mobilization, Scar mobilization, Cryotherapy, and Moist heat  PLAN FOR NEXT SESSION:  7 weeks post op week of 9/15.   Patient to bring in written instructions from Dr Duwaine.    Continue per POC    Lindamarie Maclachlan L Kaniah Rizzolo, PTA 08/29/2024, 11:16 AM  Date of referral: 06/06/24 Referring provider: Duwaine Sharper Referring diagnosis? 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 (if different than referring diagnosis) R shoulder pain, R shoulder weakness, R shoulder stiffness  What was this (referring dx) caused by? Surgery (Type: R RTC repair, SAD/DCR)  [place] of Condition: Initial Onset (within last 3 months)   Laterality: Rt  Current Functional Measure Score: DASH Quick Dash = 81.8/10  Objective measurements identify impairments when they are compared to normal values, the uninvolved extremity, and prior level of function.  [x]  Yes  []  No PROM R shoulder:  75 deg flexion; 60 deg ABD, 10 deg ER  Objective assessment of functional ability: Severe functional limitations  Briefly describe symptoms: New R RTC repair with SAD/DCR on 07/05/24; unable to use or move his R shoulder  How did symptoms start: surgery   Average pain intensity:  Last 24 hours: 4  Past week: 6  How often does the pt experience symptoms? Constantly  How much have the symptoms interfered with usual daily activities? Extremely  How has condition changed since care began at this facility? NA - initial visit  In general, how is the patients overall health? Good   BACK PAIN (STarT Back Screening Tool) No

## 2024-08-31 ENCOUNTER — Encounter: Admitting: Rehabilitation

## 2024-08-31 ENCOUNTER — Ambulatory Visit

## 2024-08-31 DIAGNOSIS — M5416 Radiculopathy, lumbar region: Secondary | ICD-10-CM | POA: Diagnosis not present

## 2024-08-31 DIAGNOSIS — M25611 Stiffness of right shoulder, not elsewhere classified: Secondary | ICD-10-CM | POA: Diagnosis not present

## 2024-08-31 DIAGNOSIS — M25511 Pain in right shoulder: Secondary | ICD-10-CM

## 2024-08-31 DIAGNOSIS — M6281 Muscle weakness (generalized): Secondary | ICD-10-CM

## 2024-08-31 DIAGNOSIS — R1031 Right lower quadrant pain: Secondary | ICD-10-CM | POA: Diagnosis not present

## 2024-08-31 DIAGNOSIS — G8929 Other chronic pain: Secondary | ICD-10-CM | POA: Diagnosis not present

## 2024-08-31 DIAGNOSIS — Z96641 Presence of right artificial hip joint: Secondary | ICD-10-CM | POA: Diagnosis not present

## 2024-08-31 DIAGNOSIS — M79651 Pain in right thigh: Secondary | ICD-10-CM | POA: Diagnosis not present

## 2024-08-31 NOTE — Therapy (Addendum)
 OUTPATIENT PHYSICAL THERAPY SHOULDER TREATMENT   Progress Note Reporting Period 07/10/24 to 08/31/24  See note below for Objective Data and Assessment of Progress/Goals.      Patient Name: Phillip Hughes MRN: [id number] DOB:[birth year], 68 y.o., male Today's Date: 08/31/2024   END OF SESSION:  PT End of Session - 08/31/24 1018     Visit Number 10    Date for Recertification  10/30/24    Authorization Type [employer] MCR    PT Start Time 0931    PT Stop Time 1016    PT Time Calculation (min) 45 min    Activity Tolerance Patient tolerated treatment well    Behavior During Therapy WFL for tasks assessed/performed              Past Medical History:  Diagnosis Date   Hypertension    Sciatica    Sinusitis    Past Surgical History:  Procedure Laterality Date   BACK SURGERY     HERNIA REPAIR     HIP SURGERY     NECK SURGERY     Patient Active Problem List   Diagnosis Date Noted   Hx of total hip arthroplasty, right 02/28/2023   Degenerative tear of medial meniscus of right knee 12/01/2022   Lumbar radiculopathy 11/29/2022   Peroneal mononeuropathy, 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    PCP: Dorina Loving, PA-C   REFERRING PROVIDER: Duwaine Ozell PARAS., MD   REFERRING DIAG: Tear of R rotator cuff, unspecified tear extent, unspecified whether traumatic (M75.101); 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); Subacromial bursitis R shoulder (M75.51); 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   PAIN: Are you having pain? Yes: NPRS scale: 4/10 now and worst Pain location: R shoulder Pain description: throbbing Aggravating factors: any movements Relieving factors: rest, ice, meds  PERTINENT HISTORY:  R THA, Back surgery 2017, lumbar radiculopathy, Neck surgery, HTN  PRECAUTIONS: Shoulder RTC protocol precautions for Dr Duwaine  RED FLAGS: None  HAND DOMINANCE: Left  WEIGHT BEARING RESTRICTIONS: Yes NWB RUE  FALLS:  Has patient fallen in last 6 months? 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;  would like to return to playing golf (hasn't done since 2023)   OBJECTIVE: (objective measures completed at initial evaluation unless otherwise dated)  PATIENT SURVEYS:  Quick Dash = 81.8/100  COGNITION: Overall cognitive status: Within functional limits for tasks assessed     SENSATION: WFL  POSTURE: No Significant postural limitations  UPPER EXTREMITY ROM:   Passive ROM Right eval Left eval R 07/23/24 RUE 08/06/24 RUE 08/14/24 RUE 08/21/24 R 08/29/24   Shoulder flexion 75 180 90 120 130 133 143  Shoulder extension NT 55       Shoulder abduction  175  100 110 105 130  Shoulder adduction         Shoulder internal rotation Hand to stomach in sling 75   Hand to stomach;  30 degrees at 40 deg scaption At 45 deg scaption: 45 degrees 53  Shoulder external rotation 10 80 10 32 37 At 45 degrees scaption:  44 degrees   Elbow flexion WNL = BUE        Elbow extension         Wrist flexion         Wrist extension         Wrist ulnar deviation         Wrist radial deviation         Wrist pronation          Wrist supination         (Blank rows = not tested)  UPPER EXTREMITY MMT: 07/10/24--No strength testing done at eval due to surgical precautions  MMT Right eval Left eval  Shoulder flexion    Shoulder extension    Shoulder abduction    Shoulder adduction    Shoulder internal rotation    Shoulder external rotation    Middle trapezius    Lower trapezius    Elbow flexion    Elbow extension    Wrist flexion    Wrist extension    Wrist ulnar deviation    Wrist radial deviation    Wrist pronation    Wrist supination    Grip strength (lbs)    (Blank rows = not tested)  SHOULDER SPECIAL TESTS:  N/A   JOINT MOBILITY TESTING:  N/A  PALPATION:  TTP over the anterior lateral deltoid area and upper traps    TODAY'S TREATMENT:  08/31/24 UBE passive to [facility] of the RUE riding in the handle but not grasping L1x 3 min each way Pulleys 3 min flexion and 3 min scaption  Wall wash flexion x 10 Isometric R shld at doorframe; flexion, ER, ABD, ext 5x5 each movement PROM stretching into end range flex, scap, ER, ABD slightly past 90 deg some light joint mobs with end range flex  08/29/24 UBE passive to [facility] of the RUE riding in the handle but not grasping L0 x 6 min fwd Pulleys flexion/scaption PROM on R UE x 3 min Supine AROM R shoulder flexion x 10 below 90 Supine ER with wand 10x5; 2 sets PROM to R shoulder within tolerance and protocol limits  08/20/24  THERAPEUTIC EXERCISE: To improve ROM.  Demonstration, verbal and tactile cues throughout for technique. UBE passive to [facility] of the RUE riding in the handle but not grasping L0 3'F/3'B  Pulley flexion/scaption AAROM to 90 degrees x 2' PROM of the L shoulder x 20 min into flexion/scaption to 133 degrees;  ER/IR at 45 degrees scaption to 44 and 45 degrees respectively  THERAPEUTIC ACTIVITIES: To improve functional performance.  Demonstration, verbal and tactile cues throughout for techniqu Pendulums CW x 20; CCW x 20;  flex/ext x  20 Bent over rowing to hip/body 0 deg ext x 20 RUE Supine active ER at neutral position x 50 Supine wand ER stretch at neutral  Counter dust CW x 20; CCW x 20; F/B x 20  MODALITIES:   Vasopneumatic x 15' to R shoulder seated at min compression  08/14/24: THERAPEUTIC EXERCISE: To improve ROM.  Demonstration, verbal and tactile cues throughout for technique. PROM R shoulder in supine in all planes x with intermittent grade 1-2 gentle oscillations x 26' ROM checked (see above table) Shoulder shrugs x 30 BUE Shoulder circles F/B x 30 each direction Elbow flex/ext x 30 AROM RUE Forearm pronation/supination x 30 RUE Wrist flex/ext x 30 RUE Hand grip/squeeze x 30 RUE Standing shoulder submaximal isometrics at wall for bent elbow shoulder extension, bent elbow chest press, bent elbow IR x 30 RUE (with PT hand under patient's elbow for pressure monitoring)  MODALITIES:   Vasopneumatic x 15' to R shoulder seated at min compression  08/06/24 THERAPEUTIC EXERCISE: To improve ROM.  Demonstration, verbal and tactile cues throughout for technique. PROM R shoulder x 30 minutes in all planes within precautionary ROM per [facility] protocol faxed by Dr Duwaine Shoulder shrugs x 30 BUE Shoulder circles F/B x 30 Elbow flex/ext x 30 AROM Wrist flex/ext x 30 RUE Forearm pronate/supinate x 30 RUE Ball squeeze x 30 RUE Standing isometric extension, ER, and punch/press x 30 into PT hand at wall with PT assist for submax contraction  MODALITIES:   Vasopneumatic x 15' to R shoulder seated at min compression  PATIENT EDUCATION:  Education details: isometrics review Person educated: Patient Education method: Explanation, Demonstration, Verbal cues, Tactile cues, and Handouts Education comprehension: verbalized understanding, verbal cues required, tactile cues required, and needs further education  HOME EXERCISE PROGRAM: Access Code: [email or web address] URL: [email or web address] Date:  08/21/2024 Prepared by: Garnette Montclair  Exercises - Seated Shoulder Scaption AAROM with Pulley at Side  - 1 x daily - 7 x weekly - 3 sets - 10 reps - Circular Shoulder Pendulum with Table Support  - 1 x daily - 7 x weekly - 3 sets - 10 reps - Flexion-Extension Shoulder Pendulum with Table Support  - 1 x daily - 7 x weekly - 3 sets - 10 reps - Standing Bent Over Single Arm Scapular Row with Table Support  - 1 x daily - 7 x weekly - 3 sets - 10 reps - Seated Single Arm Shoulder External Rotation  - 1 x daily - 7 x weekly - 3 sets - 10 reps - Supine Shoulder External Rotation with Dowel  - 1 x daily - 7 x weekly - 3 sets - 10 reps - Supine Shoulder Flexion Extension AAROM with Dowel  - 1 x daily - 7 x weekly - 1 sets - 3 reps - 30 sec hold  ASSESSMENT:  CLINICAL IMPRESSION:  Pt now 7 weeks out from RTC surgery. PROM is improving well but still has some tightness into end ranges. No pain from the session. Tactile  feedback to stay relaxed and for form throughout session. PT remains necessary for ROM, weakness, lack of ADL ability with the RUE.   Continue per protocol  OBJECTIVE IMPAIRMENTS: decreased ROM, decreased strength, decreased safety awareness, increased edema, increased muscle spasms, impaired flexibility, impaired UE functional use, and pain.   ACTIVITY LIMITATIONS: carrying, lifting, sleeping, bathing, toileting, dressing, self feeding, reach over head, and hygiene/grooming  PARTICIPATION LIMITATIONS: meal prep, cleaning, laundry, driving, and shopping  PERSONAL FACTORS: Age and 1-2 comorbidities: HTN, sciatica, lumbar radiculopathy and chronic back pain are also affecting patient's functional outcome.   REHAB POTENTIAL: Good  CLINICAL DECISION MAKING: Evolving/moderate complexity  EVALUATION COMPLEXITY: Moderate   GOALS: Goals reviewed with patient? Yes  SHORT TERM GOALS: Target date: 09/04/2024  Patient will be independent with initial HEP to improve outcomes and  carryover.  Baseline: 100% PT assist required for correct completion Goal status: MET- independent with most exercises but needs cues with scapular retraction 07/17/24;  08/06/24 independent  2.  Patient will report 25% improvement in R shoulder pain to improve QOL.   Baseline: 4/10  8/18:  2/10 Goal status: MET  3.  Patient will demonstrate R shoulder ROM (within protocol limitations) of 140 degrees flexion/scaption/abduction, 40 degrees ER, and IR to R buttock Baseline: see ROM table above 08/06/24:  reassessed.  See ROM tables above Goal status: PARTIALLY MET- 08/29/24  4.  Patient will demonstrate 3+/5 R shoulder strength for ability to do haircare, self hygiene, reaching above head  Baseline:  strength NT due to surgical precautions  Goal Status:  INITIAL  LONG TERM GOALS: Target date: (10/05/24)  Patient will be independent with ongoing/advanced HEP for self-management at home.  Baseline: no advanced HEP Yet Goal status: IN PROGRESS-   2.  Patient will report 50-75% improvement in R shoulder pain to improve QOL.  Baseline: 9/2:  0/10 today at rest;  2/10 with PROM Goal status: MET  4.  Patient to improve R shoulder AROM to WNL without pain provocation to allow for increased ease of ADLs.  Baseline: Refer to above UE ROM table Goal status: IN PROGRESS- 08/31/24  5.  Patient will demonstrate improved R shoulder strength to >/= 5/5 for functional UE use. Baseline: Refer to above UE MMT table Goal status: INITIAL  6  Patient will report </= 40 % on QuickDASH (MCID = 14%) to demonstrate improved functional ability.  Baseline: 81.8% Goal status: INITIAL  7.  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   Baseline: not allowed due to surgical restrictions Goal status: INITIAL   PLAN:  PT FREQUENCY: 1-2x/week  PT DURATION: other: 16 weeks  PLANNED INTERVENTIONS: 97164- PT Re-evaluation, 97750- Physical  Performance Testing, 97110-Therapeutic exercises, 97530- Therapeutic activity, [id number]- Neuromuscular re-education, 97535- Self Care, 02859- Manual therapy, G0283- Electrical stimulation (unattended), 97016- Vasopneumatic device, [id number]- Ultrasound, 79439 (1-2 muscles), 20561 (3+ muscles)- Dry Needling, Patient/Family education, Taping, Joint mobilization, Scar mobilization, Cryotherapy, and Moist heat  PLAN FOR NEXT SESSION:  7 weeks post op week of 9/15.  Continue per POC    Soha Thorup L Dezmen Alcock, PTA 08/31/2024, 11:46 AM   10th visit progress note: Patient has been seen x 6 weeks PT following a RCR by Dr Duwaine.   He is making good progress and just got out of his sling and released for AROM.   He has already improved with his ROM to: 143 flexion    130 abduction  53 ER  PT remains necessary for AROM and strength progression so that he can return to playing golf and doing light housekeeping  Garnette Montclair, PT    Date of referral: 06/06/24 Referring provider: Duwaine Sharper Referring diagnosis? 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 (if different than referring diagnosis) R shoulder pain, R shoulder weakness, R shoulder stiffness  What was this (referring dx) caused by? Surgery (Type: R RTC repair, SAD/DCR)  [place] of Condition: Initial Onset (within last 3 months)   Laterality: Rt  Current Functional Measure Score: DASH Quick Dash = 81.8/10  Objective measurements identify impairments when they are compared to normal values, the uninvolved extremity, and prior level of function.  [x]  Yes  []  No PROM R shoulder:  75 deg flexion; 60 deg ABD, 10 deg ER  Objective assessment of functional ability: Severe functional limitations   Briefly describe symptoms: New R RTC repair with SAD/DCR on 07/05/24;  unable to use or move his R shoulder  How did symptoms start: surgery   Average pain intensity:  Last 24 hours: 4  Past week: 6  How often does the pt experience symptoms? Constantly  How much have the symptoms interfered with usual daily activities? Extremely  How has condition changed since care began at this facility? NA - initial visit  In general, how is the patients overall health? Good   BACK PAIN (STarT Back Screening Tool) No

## 2024-09-04 ENCOUNTER — Ambulatory Visit: Admitting: Rehabilitation

## 2024-09-04 DIAGNOSIS — M25611 Stiffness of right shoulder, not elsewhere classified: Secondary | ICD-10-CM

## 2024-09-04 DIAGNOSIS — M25511 Pain in right shoulder: Secondary | ICD-10-CM | POA: Diagnosis not present

## 2024-09-04 DIAGNOSIS — M6281 Muscle weakness (generalized): Secondary | ICD-10-CM

## 2024-09-04 DIAGNOSIS — G8929 Other chronic pain: Secondary | ICD-10-CM | POA: Diagnosis not present

## 2024-09-04 NOTE — Therapy (Signed)
 OUTPATIENT PHYSICAL THERAPY SHOULDER TREATMENT    Patient Name: Phillip Hughes MRN: [id number] DOB:December 09, 1956, 68 y.o., male Today's Date: 09/04/2024   END OF SESSION:  PT End of Session - 09/04/24 0854     Visit Number 11    Date for Recertification  10/30/24    Authorization Type [employer] MCR    PT Start Time 0847    PT Stop Time 0942    PT Time Calculation (min) 55 min    Activity Tolerance Patient tolerated treatment well    Behavior During Therapy [facility] for tasks assessed/performed              Past Medical History:  Diagnosis Date   Hypertension    Sciatica    Sinusitis    Past Surgical History:  Procedure Laterality Date   BACK SURGERY     HERNIA REPAIR     HIP SURGERY     NECK SURGERY     Patient Active Problem List   Diagnosis Date Noted   Hx of total hip arthroplasty, right 02/28/2023   Degenerative tear of medial meniscus of right knee 12/01/2022   Lumbar radiculopathy 11/29/2022   Peroneal mononeuropathy, 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    PCP: Dorina Loving, PA-C   REFERRING PROVIDER: Duwaine Ozell PARAS., MD   REFERRING DIAG: Tear of R rotator cuff, unspecified tear extent, unspecified whether traumatic (M75.101); 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); Subacromial bursitis R shoulder (M75.51); Chronic R shoulder pain (M25.511, G89.29)  THERAPY DIAG:  Acute pain of right shoulder  Muscle weakness (generalized)  Stiffness of right shoulder, not elsewhere classified  RATIONALE FOR EVALUATION AND TREATMENT: Rehabilitation  ONSET DATE: 07/05/54  NEXT MD VISIT: 08/15/24   SUBJECTIVE:                                                                                                                                                                                                         SUBJECTIVE STATEMENT: Patient states feels pretty good today, and has minimal to no pain.  He admits that he has been doing some WB thru the RUE when trying to get his leg in/out of bed.   He is  advised that he shoulder remain NWB thru the RUE at this time for RTC precautions and per protocol.  He verbalizes understanding and agreement.    PAIN: Are you having pain? Yes: NPRS scale: 4/10 now and worst Pain location: R shoulder Pain description: throbbing Aggravating factors: any movements Relieving factors: rest, ice, meds  PERTINENT HISTORY:  R THA, Back surgery 2017, lumbar radiculopathy, Neck surgery,  HTN  PRECAUTIONS: Shoulder RTC protocol precautions for Dr Duwaine  RED FLAGS: None  HAND DOMINANCE: Left  WEIGHT BEARING RESTRICTIONS: Yes NWB RUE  FALLS:  Has patient fallen in last 6 months? 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;  would like to return to playing golf (hasn't done since 2023)   OBJECTIVE: (objective measures completed at initial evaluation unless otherwise dated)  PATIENT SURVEYS:  Quick Dash = 81.8/100  COGNITION: Overall cognitive status: Within functional limits for tasks assessed     SENSATION: WFL  POSTURE: No Significant postural limitations  UPPER EXTREMITY ROM:   Passive ROM Right eval Left eval R 07/23/24 RUE 08/06/24 RUE 08/14/24 RUE 08/21/24 R 08/29/24  RUE 09/04/24  Shoulder flexion 75 180 90 120 130 133 143 149  Shoulder extension NT 55        Shoulder abduction  175  100 110 105 130 122  Shoulder adduction          Shoulder internal rotation Hand to stomach in sling 75   Hand to stomach;  30 degrees at 40 deg scaption At 45 deg scaption: 45 degrees 53 At 70 degrees ABD:  60-65 degrees  Shoulder external rotation 10 80 10 32 37 At 45 degrees scaption:  44 degrees  At 90 deg  ABD:  70 deg;  At neutral:  50 degrees  Elbow flexion WNL = BUE         Elbow extension          Wrist flexion          Wrist extension          Wrist ulnar deviation          Wrist radial deviation          Wrist pronation          Wrist supination          (Blank rows = not tested)  UPPER EXTREMITY MMT: 07/10/24--No strength testing done at eval due to surgical precautions  MMT Right eval Left eval  Shoulder flexion    Shoulder extension    Shoulder abduction    Shoulder adduction    Shoulder internal rotation    Shoulder external rotation    Middle trapezius    Lower trapezius    Elbow flexion    Elbow extension    Wrist flexion    Wrist extension    Wrist ulnar deviation    Wrist radial deviation    Wrist pronation    Wrist supination    Grip strength (lbs)    (Blank rows = not tested)  SHOULDER SPECIAL TESTS:  N/A   JOINT MOBILITY TESTING:  N/A  PALPATION:  TTP over the anterior lateral deltoid area and upper traps    TODAY'S TREATMENT:  09/04/24 UBE  grasping handles L0 x 3'F/3'B Pulleys 2 min flexion and 2 min abduction Gentle passive stretching into ER and IR at 70-90 degrees ABD and scaption  Wall dust flexion AAROM x 2/10 BUE Seated ER AROM x 30 RUE Seated wand ER stretch x 1' x 2 RUE Supine ER AROM X 30 RUE Supine wand press up x 20 BUE Supine wand flexion  x 20 BUE Supine circles at 90 w/ PT assist/facilitation x 20 CW;  x 20 CCW SL ER x 20 LUE   MODALITIES: CP X 15' R shoulder  08/31/24 UBE passive to [facility] of the RUE riding in the handle but not grasping L1x 3 min each way Pulleys 3 min flexion and 3 min scaption  Wall wash flexion x 10 Isometric R shld at doorframe; flexion, ER, ABD, ext 5x5 each movement PROM stretching into end range flex, scap, ER, ABD slightly past 90 deg some light joint mobs with end range flex  08/29/24 UBE passive to [facility] of the RUE riding in the handle but not grasping L0 x 6 min fwd Pulleys flexion/scaption  PROM on R UE x 3 min Supine AROM R shoulder flexion x 10 below 90 Supine ER with wand 10x5; 2 sets PROM to R shoulder within tolerance and protocol limits  08/20/24  THERAPEUTIC EXERCISE: To improve ROM.  Demonstration, verbal and tactile cues throughout for technique. UBE passive to [facility] of the RUE riding in the handle but not grasping L0 3'F/3'B  Pulley flexion/scaption AAROM to 90 degrees x 2' PROM of the L shoulder x 20 min into flexion/scaption to 133 degrees;  ER/IR at 45 degrees scaption to 44 and 45 degrees respectively  THERAPEUTIC ACTIVITIES: To improve functional performance.  Demonstration, verbal and tactile cues throughout for techniqu Pendulums CW x 20; CCW x 20;  flex/ext x 20 Bent over rowing to hip/body 0 deg ext x 20 RUE Supine active ER at neutral position x 50 Supine wand ER stretch at neutral  Counter dust CW x 20; CCW x 20; F/B x 20  MODALITIES:   Vasopneumatic x 15' to R shoulder seated at min compression  08/14/24: THERAPEUTIC EXERCISE: To improve ROM.  Demonstration, verbal and tactile cues throughout for technique. PROM R shoulder in supine in all planes x with intermittent grade 1-2 gentle oscillations x 26' ROM checked (see above table) Shoulder shrugs x 30 BUE Shoulder circles F/B x 30 each direction Elbow flex/ext x 30 AROM RUE Forearm pronation/supination x 30 RUE Wrist flex/ext x 30 RUE Hand grip/squeeze x 30 RUE Standing shoulder submaximal isometrics at wall for bent elbow shoulder extension, bent elbow chest press, bent elbow IR x 30 RUE (with PT hand under patient's elbow for pressure monitoring)  MODALITIES:   Vasopneumatic x 15' to R shoulder seated at min compression  08/06/24 THERAPEUTIC EXERCISE: To improve ROM.  Demonstration, verbal and tactile cues throughout for technique. PROM R shoulder x 30 minutes in all planes within precautionary ROM per [facility] protocol faxed by Dr Duwaine Shoulder shrugs x 30 BUE Shoulder circles F/B x 30 Elbow  flex/ext x 30 AROM Wrist flex/ext x 30 RUE Forearm pronate/supinate x 30 RUE Ball squeeze x 30 RUE Standing isometric extension, ER, and punch/press x 30 into PT hand at wall with PT assist for submax contraction  MODALITIES:   Vasopneumatic x 15' to R shoulder seated at min compression  PATIENT EDUCATION:  Education details: isometrics review Person educated: Patient Education method: Explanation, Demonstration, Verbal cues, Tactile cues, and Handouts Education comprehension: verbalized understanding, verbal cues required, tactile cues required, and needs further education  HOME EXERCISE PROGRAM: Access Code: [email or web address] URL: [email or web address] Date: 08/21/2024 Prepared by: Garnette Montclair  Exercises - Seated Shoulder Scaption AAROM with Pulley at Side  - 1 x daily - 7 x weekly - 3 sets - 10 reps - Circular Shoulder Pendulum with Table Support  - 1 x  daily - 7 x weekly - 3 sets - 10 reps - Flexion-Extension Shoulder Pendulum with Table Support  - 1 x daily - 7 x weekly - 3 sets - 10 reps - Standing Bent Over Single Arm Scapular Row with Table Support  - 1 x daily - 7 x weekly - 3 sets - 10 reps - Seated Single Arm Shoulder External Rotation  - 1 x daily - 7 x weekly - 3 sets - 10 reps - Supine Shoulder External Rotation with Dowel  - 1 x daily - 7 x weekly - 3 sets - 10 reps - Supine Shoulder Flexion Extension AAROM with Dowel  - 1 x daily - 7 x weekly - 1 sets - 3 reps - 30 sec hold  ASSESSMENT:  CLINICAL IMPRESSION:  Patient is 8 weeks post op this week.   HE is not having much pain at all and his external and internal rotation ROM is markedly improved this week.   He is counseled about not WB thru the RUE yet and only doing the exercises that we are giving him.  He requires further therapy to regain antigravity strength in the R shoulder with slow, gradual strength progression so that he can return to doing ADLs eventually.   Continue per POC  OBJECTIVE  IMPAIRMENTS: decreased ROM, decreased strength, decreased safety awareness, increased edema, increased muscle spasms, impaired flexibility, impaired UE functional use, and pain.   ACTIVITY LIMITATIONS: carrying, lifting, sleeping, bathing, toileting, dressing, self feeding, reach over head, and hygiene/grooming  PARTICIPATION LIMITATIONS: meal prep, cleaning, laundry, driving, and shopping  PERSONAL FACTORS: Age and 1-2 comorbidities: HTN, sciatica, lumbar radiculopathy and chronic back pain are also affecting patient's functional outcome.   REHAB POTENTIAL: Good  CLINICAL DECISION MAKING: Evolving/moderate complexity  EVALUATION COMPLEXITY: Moderate   GOALS: Goals reviewed with patient? Yes  SHORT TERM GOALS: Target date: 09/04/2024  Patient will be independent with initial HEP to improve outcomes and carryover.  Baseline: 100% PT assist required for correct completion Goal status: MET- independent with most exercises but needs cues with scapular retraction 07/17/24;  08/06/24 independent  2.  Patient will report 25% improvement in R shoulder pain to improve QOL.   Baseline: 4/10  8/18:  2/10 Goal status: MET  3.  Patient will demonstrate R shoulder ROM (within protocol limitations) of 140 degrees flexion/scaption/abduction, 40 degrees ER, and IR to R buttock Baseline: see ROM table above 08/06/24:  reassessed.  See ROM tables above Goal status: PARTIALLY MET- 08/29/24  4.  Patient will demonstrate 3+/5 R shoulder strength for ability to do haircare, self hygiene, reaching above head  Baseline:  strength NT due to surgical precautions 09/04/24:  Has 3+ strength for R shoulder flexion, but has compensatory UT pattern of lifting  Goal Status:  IN PROGRESS  LONG TERM GOALS: Target date: (10/05/24)  Patient will be independent with ongoing/advanced HEP for self-management at home.  Baseline: no advanced HEP Yet 9/23-adding to HEP each visit Goal status: IN PROGRESS-   2.  Patient  will report 50-75% improvement in R shoulder pain to improve QOL.  Baseline: 9/2:  0/10 today at rest;  2/10 with PROM Goal status: MET  4.  Patient to improve R shoulder AROM to WNL without pain provocation to allow for increased ease of ADLs.  Baseline: Refer to above UE ROM table Goal status: IN PROGRESS- 08/31/24  5.  Patient will demonstrate improved R shoulder strength to >/= 5/5 for functional UE use. Baseline: Refer to  above UE MMT table Goal status: INITIAL  6  Patient will report </= 40 % on QuickDASH (MCID = 14%) to demonstrate improved functional ability.  Baseline: 81.8% Goal status: INITIAL  7.  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   Baseline: not allowed due to surgical restrictions Goal status: INITIAL   PLAN:  PT FREQUENCY: 1-2x/week  PT DURATION: other: 16 weeks  PLANNED INTERVENTIONS: 97164- PT Re-evaluation, 97750- Physical Performance Testing, 97110-Therapeutic exercises, 97530- Therapeutic activity, [id number]- Neuromuscular re-education, 97535- Self Care, 02859- Manual therapy, [id number]- Electrical stimulation (unattended), 97016- Vasopneumatic device, [id number]- Ultrasound, 79439 (1-2 muscles), 20561 (3+ muscles)- Dry Needling, Patient/Family education, Taping, Joint mobilization, Scar mobilization, Cryotherapy, and Moist heat  PLAN FOR NEXT SESSION:  8 weeks post op 09/03/24.  Continue per protocol.  May add some AROM in now.   Letzy Gullickson, PT 09/04/2024, 1:00 PM  Date of referral: 06/06/24 Referring provider: Duwaine Sharper Referring diagnosis? 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 (if different than referring diagnosis) R shoulder pain, R shoulder weakness, R shoulder stiffness  What was this (referring  dx) caused by? Surgery (Type: R RTC repair, SAD/DCR)  [place] of Condition: Initial Onset (within last 3 months)   Laterality: Rt  Current Functional Measure Score: DASH Quick Dash = 81.8/10  Objective measurements identify impairments when they are compared to normal values, the uninvolved extremity, and prior level of function.  [x]  Yes  []  No PROM R shoulder:  75 deg flexion; 60 deg ABD, 10 deg ER  Objective assessment of functional ability: Severe functional limitations   Briefly describe symptoms: New R RTC repair with SAD/DCR on 07/05/24; unable to use or move his R shoulder  How did symptoms start: surgery   Average pain intensity:  Last 24 hours: 4  Past week: 6  How often does the pt experience symptoms? Constantly  How much have the symptoms interfered with usual daily activities? Extremely  How has condition changed since care began at this facility? NA - initial visit  In general, how is the patients overall health? Good   BACK PAIN (STarT Back Screening Tool) No

## 2024-09-06 ENCOUNTER — Ambulatory Visit

## 2024-09-06 DIAGNOSIS — M6281 Muscle weakness (generalized): Secondary | ICD-10-CM | POA: Diagnosis not present

## 2024-09-06 DIAGNOSIS — M25511 Pain in right shoulder: Secondary | ICD-10-CM | POA: Diagnosis not present

## 2024-09-06 DIAGNOSIS — M25611 Stiffness of right shoulder, not elsewhere classified: Secondary | ICD-10-CM | POA: Diagnosis not present

## 2024-09-06 DIAGNOSIS — G8929 Other chronic pain: Secondary | ICD-10-CM | POA: Diagnosis not present

## 2024-09-06 NOTE — Therapy (Signed)
 OUTPATIENT PHYSICAL THERAPY SHOULDER TREATMENT    Patient Name: Phillip Hughes MRN: [id number] DOB:Oct 09, 1956, 68 y.o., male Today's Date: 09/06/2024   END OF SESSION:  PT End of Session - 09/06/24 0855     Visit Number 12    Date for Recertification  10/30/24    Authorization Type [employer] MCR    PT Start Time [phone]   pt late   PT Stop Time 0931    PT Time Calculation (min) 40 min    Activity Tolerance Patient tolerated treatment well    Behavior During Therapy [facility] for tasks assessed/performed              Past Medical History:  Diagnosis Date   Hypertension    Sciatica    Sinusitis    Past Surgical History:  Procedure Laterality Date   BACK SURGERY     HERNIA REPAIR     HIP SURGERY     NECK SURGERY     Patient Active Problem List   Diagnosis Date Noted   Hx of total hip arthroplasty, right 02/28/2023   Degenerative tear of medial meniscus of right knee 12/01/2022   Lumbar radiculopathy 11/29/2022   Peroneal mononeuropathy, 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    PCP: Dorina Loving, PA-C   REFERRING PROVIDER: Duwaine Ozell PARAS., MD   REFERRING DIAG: Tear of R rotator cuff, unspecified tear extent, unspecified whether traumatic (M75.101); 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); Subacromial bursitis R shoulder (M75.51); Chronic R shoulder pain (M25.511, G89.29)  THERAPY DIAG:  Acute pain of right shoulder  Muscle weakness (generalized)  Stiffness of right shoulder, not elsewhere classified  Chronic right shoulder pain  RATIONALE FOR EVALUATION AND TREATMENT: Rehabilitation  ONSET DATE: 07/05/54  NEXT MD VISIT: 08/15/24   SUBJECTIVE:                                                                                                                                                                                                         SUBJECTIVE STATEMENT: Pt reports his R leg hurt so bad it caused him to jump while in bed, R shoulder hurt just a little bit.   PAIN: Are you having  pain? Yes: NPRS scale: 4/10 now and worst Pain location: R shoulder Pain description: throbbing Aggravating factors: any movements Relieving factors: rest, ice, meds  PERTINENT HISTORY:  R THA, Back surgery 2017, lumbar radiculopathy, Neck surgery, HTN  PRECAUTIONS: Shoulder RTC protocol precautions for Dr Duwaine  RED FLAGS: None  HAND DOMINANCE: Left  WEIGHT BEARING RESTRICTIONS: Yes NWB RUE  FALLS:  Has patient fallen in  last 6 months? 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;  would like to return to playing golf (hasn't done since 2023)   OBJECTIVE: (objective measures completed at initial evaluation unless otherwise dated)  PATIENT SURVEYS:  Quick Dash = 81.8/100  COGNITION: Overall cognitive status: Within functional limits for tasks assessed     SENSATION: WFL  POSTURE: No Significant postural limitations  UPPER EXTREMITY ROM:   Passive ROM Right eval Left eval R 07/23/24 RUE 08/06/24 RUE 08/14/24 RUE 08/21/24 R 08/29/24  RUE 09/04/24  Shoulder flexion 75 180 90 120 130 133 143 149  Shoulder extension NT 55        Shoulder abduction  175  100 110 105 130 122  Shoulder adduction          Shoulder internal rotation Hand to stomach in sling 75   Hand to stomach;  30 degrees at 40 deg scaption At 45 deg scaption: 45 degrees 53 At 70 degrees ABD:  60-65 degrees  Shoulder external rotation 10 80 10 32 37 At 45 degrees scaption:  44 degrees  At 90 deg ABD:  70 deg;  At neutral:  50 degrees  Elbow flexion WNL = BUE         Elbow extension          Wrist flexion          Wrist extension          Wrist ulnar deviation           Wrist radial deviation          Wrist pronation          Wrist supination          (Blank rows = not tested)  UPPER EXTREMITY MMT: 07/10/24--No strength testing done at eval due to surgical precautions  MMT Right eval Left eval  Shoulder flexion    Shoulder extension    Shoulder abduction    Shoulder adduction    Shoulder internal rotation    Shoulder external rotation    Middle trapezius    Lower trapezius    Elbow flexion    Elbow extension    Wrist flexion    Wrist extension    Wrist ulnar deviation    Wrist radial deviation    Wrist pronation    Wrist supination    Grip strength (lbs)    (Blank rows = not tested)  SHOULDER SPECIAL TESTS:  N/A   JOINT MOBILITY TESTING:  N/A  PALPATION:  TTP over the anterior lateral deltoid area and upper traps    TODAY'S TREATMENT:  09/06/24 UBE 3'F/3'B Pulleys flexion and scaption x 2 min each CCW circles on wall x 20 R shld Wall scaption x 10 R shld; wall flexion x 20 R shld Seated R shoulder ER AA with wand 2x68min holds Standing scap depression 10x5 Standing shoulder ext YTB x 10 Supine R shld flexion x 10 AROM Supine R shld CW/CCW circles x 10   09/04/24 UBE  grasping handles L0 x 3'F/3'B Pulleys 2 min flexion and 2 min abduction Gentle passive stretching into ER and IR at 70-90 degrees ABD and scaption  Wall dust flexion AAROM  x 2/10 BUE Seated ER AROM x 30 RUE Seated wand ER stretch x 1' x 2 RUE Supine ER AROM X 30 RUE Supine wand press up x 20 BUE Supine wand flexion x 20 BUE Supine circles at 90 w/ PT assist/facilitation x 20 CW;  x 20 CCW SL ER x 20 LUE   MODALITIES: CP X 15' R shoulder  08/31/24 UBE passive to [facility] of the RUE riding in the handle but not grasping L1x 3 min each way Pulleys 3 min flexion and 3 min scaption  Wall wash flexion x 10 Isometric R shld at doorframe; flexion, ER, ABD, ext 5x5 each movement PROM stretching into end range flex, scap, ER, ABD slightly past 90 deg some  light joint mobs with end range flex  08/29/24 UBE passive to [facility] of the RUE riding in the handle but not grasping L0 x 6 min fwd Pulleys flexion/scaption PROM on R UE x 3 min Supine AROM R shoulder flexion x 10 below 90 Supine ER with wand 10x5; 2 sets PROM to R shoulder within tolerance and protocol limits  08/20/24  THERAPEUTIC EXERCISE: To improve ROM.  Demonstration, verbal and tactile cues throughout for technique. UBE passive to [facility] of the RUE riding in the handle but not grasping L0 3'F/3'B  Pulley flexion/scaption AAROM to 90 degrees x 2' PROM of the L shoulder x 20 min into flexion/scaption to 133 degrees;  ER/IR at 45 degrees scaption to 44 and 45 degrees respectively  THERAPEUTIC ACTIVITIES: To improve functional performance.  Demonstration, verbal and tactile cues throughout for techniqu Pendulums CW x 20; CCW x 20;  flex/ext x 20 Bent over rowing to hip/body 0 deg ext x 20 RUE Supine active ER at neutral position x 50 Supine wand ER stretch at neutral  Counter dust CW x 20; CCW x 20; F/B x 20  MODALITIES:   Vasopneumatic x 15' to R shoulder seated at min compression  08/14/24: THERAPEUTIC EXERCISE: To improve ROM.  Demonstration, verbal and tactile cues throughout for technique. PROM R shoulder in supine in all planes x with intermittent grade 1-2 gentle oscillations x 26' ROM checked (see above table) Shoulder shrugs x 30 BUE Shoulder circles F/B x 30 each direction Elbow flex/ext x 30 AROM RUE Forearm pronation/supination x 30 RUE Wrist flex/ext x 30 RUE Hand grip/squeeze x 30 RUE Standing shoulder submaximal isometrics at wall for bent elbow shoulder extension, bent elbow chest press, bent elbow IR x 30 RUE (with PT hand under patient's elbow for pressure monitoring)  MODALITIES:   Vasopneumatic x 15' to R shoulder seated at min compression  08/06/24 THERAPEUTIC EXERCISE: To improve ROM.  Demonstration, verbal and tactile cues throughout for technique. PROM  R shoulder x 30 minutes in all planes within precautionary ROM per [facility] protocol faxed by Dr Duwaine Shoulder shrugs x 30 BUE Shoulder circles F/B x 30 Elbow flex/ext x 30 AROM Wrist flex/ext x 30 RUE Forearm pronate/supinate x 30 RUE Ball squeeze x 30 RUE Standing isometric extension, ER, and punch/press x 30 into PT hand at wall with PT assist for submax contraction  MODALITIES:   Vasopneumatic x 15' to R shoulder seated at min compression  PATIENT EDUCATION:  Education details: isometrics review Person educated: Patient Education method: Explanation, Demonstration, Verbal cues, Tactile cues, and Handouts Education comprehension: verbalized understanding, verbal cues required, tactile cues required, and needs further education  HOME EXERCISE PROGRAM: Access Code: [email or web address] URL: [email or web address] Date: 08/21/2024 Prepared by: Garnette Montclair  Exercises -  Seated Shoulder Scaption AAROM with Pulley at Side  - 1 x daily - 7 x weekly - 3 sets - 10 reps - Circular Shoulder Pendulum with Table Support  - 1 x daily - 7 x weekly - 3 sets - 10 reps - Flexion-Extension Shoulder Pendulum with Table Support  - 1 x daily - 7 x weekly - 3 sets - 10 reps - Standing Bent Over Single Arm Scapular Row with Table Support  - 1 x daily - 7 x weekly - 3 sets - 10 reps - Seated Single Arm Shoulder External Rotation  - 1 x daily - 7 x weekly - 3 sets - 10 reps - Supine Shoulder External Rotation with Dowel  - 1 x daily - 7 x weekly - 3 sets - 10 reps - Supine Shoulder Flexion Extension AAROM with Dowel  - 1 x daily - 7 x weekly - 1 sets - 3 reps - 30 sec hold  ASSESSMENT:  CLINICAL IMPRESSION:  Pt responded well to treatment. Advanced to some band scap stab with good response. He struggles with keeping his R scapula depressed with exercises. He needs to work more on good scapular mechanics to reduce UT compensation.  OBJECTIVE IMPAIRMENTS: decreased ROM, decreased strength,  decreased safety awareness, increased edema, increased muscle spasms, impaired flexibility, impaired UE functional use, and pain.   ACTIVITY LIMITATIONS: carrying, lifting, sleeping, bathing, toileting, dressing, self feeding, reach over head, and hygiene/grooming  PARTICIPATION LIMITATIONS: meal prep, cleaning, laundry, driving, and shopping  PERSONAL FACTORS: Age and 1-2 comorbidities: HTN, sciatica, lumbar radiculopathy and chronic back pain are also affecting patient's functional outcome.   REHAB POTENTIAL: Good  CLINICAL DECISION MAKING: Evolving/moderate complexity  EVALUATION COMPLEXITY: Moderate   GOALS: Goals reviewed with patient? Yes  SHORT TERM GOALS: Target date: 09/04/2024  Patient will be independent with initial HEP to improve outcomes and carryover.  Baseline: 100% PT assist required for correct completion Goal status: MET- independent with most exercises but needs cues with scapular retraction 07/17/24;  08/06/24 independent  2.  Patient will report 25% improvement in R shoulder pain to improve QOL.   Baseline: 4/10  8/18:  2/10 Goal status: MET  3.  Patient will demonstrate R shoulder ROM (within protocol limitations) of 140 degrees flexion/scaption/abduction, 40 degrees ER, and IR to R buttock Baseline: see ROM table above 08/06/24:  reassessed.  See ROM tables above Goal status: PARTIALLY MET- 08/29/24  4.  Patient will demonstrate 3+/5 R shoulder strength for ability to do haircare, self hygiene, reaching above head  Baseline:  strength NT due to surgical precautions 09/04/24:  Has 3+ strength for R shoulder flexion, but has compensatory UT pattern of lifting  Goal Status:  IN PROGRESS  LONG TERM GOALS: Target date: (10/05/24)  Patient will be independent with ongoing/advanced HEP for self-management at home.  Baseline: no advanced HEP Yet 9/23-adding to HEP each visit Goal status: IN PROGRESS-   2.  Patient will report 50-75% improvement in R shoulder  pain to improve QOL.  Baseline: 9/2:  0/10 today at rest;  2/10 with PROM Goal status: MET  4.  Patient to improve R shoulder AROM to WNL without pain provocation to allow for increased ease of ADLs.  Baseline: Refer to above UE ROM table Goal status: IN PROGRESS- 08/31/24  5.  Patient will demonstrate improved R shoulder strength to >/= 5/5 for functional UE use. Baseline: Refer to above UE MMT table Goal status: INITIAL  6  Patient will report </=  40 % on QuickDASH (MCID = 14%) to demonstrate improved functional ability.  Baseline: 81.8% Goal status: INITIAL  7.  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   Baseline: not allowed due to surgical restrictions Goal status: INITIAL   PLAN:  PT FREQUENCY: 1-2x/week  PT DURATION: other: 16 weeks  PLANNED INTERVENTIONS: 97164- PT Re-evaluation, 97750- Physical Performance Testing, 97110-Therapeutic exercises, 97530- Therapeutic activity, [id number]- Neuromuscular re-education, 97535- Self Care, 02859- Manual therapy, G0283- Electrical stimulation (unattended), 97016- Vasopneumatic device, [id number]- Ultrasound, 79439 (1-2 muscles), 20561 (3+ muscles)- Dry Needling, Patient/Family education, Taping, Joint mobilization, Scar mobilization, Cryotherapy, and Moist heat  PLAN FOR NEXT SESSION:  8 weeks post op 09/03/24.  Continue per protocol.  May add some AROM in now.   Sheilia Reznick L Dannia Snook, PTA 09/06/2024, 9:32 AM  Date of referral: 06/06/24 Referring provider: Duwaine Sharper Referring diagnosis? 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 (if different than referring diagnosis) R shoulder pain, R shoulder weakness, R shoulder stiffness  What was this (referring dx) caused by? Surgery (Type: R RTC repair,  SAD/DCR)  [place]ysle of Condition: Initial Onset (within last 3 months)   Laterality: Rt  Current Functional Measure Score: DASH Quick Dash = 81.8/10  Objective measurements identify impairments when they are compared to normal values, the uninvolved extremity, and prior level of function.  [x]  Yes  []  No PROM R shoulder:  75 deg flexion; 60 deg ABD, 10 deg ER  Objective assessment of functional ability: Severe functional limitations   Briefly describe symptoms: New R RTC repair with SAD/DCR on 07/05/24; unable to use or move his R shoulder  How did symptoms start: surgery   Average pain intensity:  Last 24 hours: 4  Past week: 6  How often does the pt experience symptoms? Constantly  How much have the symptoms interfered with usual daily activities? Extremely  How has condition changed since care began at this facility? NA - initial visit  In general, how is the patients overall health? Good   BACK PAIN (STarT Back Screening Tool) No

## 2024-09-11 ENCOUNTER — Ambulatory Visit: Admitting: Rehabilitation

## 2024-09-11 ENCOUNTER — Encounter: Payer: Self-pay | Admitting: Rehabilitation

## 2024-09-11 DIAGNOSIS — M6281 Muscle weakness (generalized): Secondary | ICD-10-CM

## 2024-09-11 DIAGNOSIS — M25511 Pain in right shoulder: Secondary | ICD-10-CM | POA: Diagnosis not present

## 2024-09-11 DIAGNOSIS — G8929 Other chronic pain: Secondary | ICD-10-CM | POA: Diagnosis not present

## 2024-09-11 DIAGNOSIS — M25611 Stiffness of right shoulder, not elsewhere classified: Secondary | ICD-10-CM

## 2024-09-11 NOTE — Therapy (Signed)
 OUTPATIENT PHYSICAL THERAPY SHOULDER TREATMENT    Patient Name: Phillip Hughes MRN: [id number] DOB:September 12, 1956, 68 y.o., male Today's Date: 09/11/2024   END OF SESSION:  PT End of Session - 09/11/24 0849     Visit Number 1    Date for Recertification  10/30/24    Authorization Type [employer] MCR    PT Start Time 0850    PT Stop Time 0930    PT Time Calculation (min) 40 min    Activity Tolerance Patient tolerated treatment well    Behavior During Therapy [facility] for tasks assessed/performed              Past Medical History:  Diagnosis Date   Hypertension    Sciatica    Sinusitis    Past Surgical History:  Procedure Laterality Date   BACK SURGERY     HERNIA REPAIR     HIP SURGERY     NECK SURGERY     Patient Active Problem List   Diagnosis Date Noted   Hx of total hip arthroplasty, right 02/28/2023   Degenerative tear of medial meniscus of right knee 12/01/2022   Lumbar radiculopathy 11/29/2022   Peroneal mononeuropathy, 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    PCP: Dorina Loving, PA-C   REFERRING PROVIDER: Duwaine Ozell PARAS., MD   REFERRING DIAG: Tear of R rotator cuff, unspecified tear extent, unspecified whether traumatic (M75.101); 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); Subacromial bursitis R shoulder (M75.51); Chronic R shoulder pain (M25.511, G89.29)  THERAPY DIAG:  Acute pain of right shoulder  Muscle weakness (generalized)  Stiffness of right shoulder, not elsewhere classified  RATIONALE FOR EVALUATION AND TREATMENT: Rehabilitation  ONSET DATE: 07/05/54  NEXT MD VISIT: 10/16/24   SUBJECTIVE:                                                                                                                                                                                                         SUBJECTIVE STATEMENT: Patient states his R shoulder is really not hurting him now.  His only c/o regarding the R shoulder now is weakness.  His biggest c/o is his back and RLE pain.   He is having  a difficult time walking today.   States he had a R hip MRI and is seeing MD Monday to get results.    PAIN: Are you having pain? Yes: NPRS scale: 4/10 now and worst Pain location: R shoulder Pain description: throbbing Aggravating factors: any movements Relieving factors: rest, ice, meds  PERTINENT HISTORY:  R THA, Back surgery 2017, lumbar radiculopathy, Neck surgery, HTN  PRECAUTIONS:  Shoulder RTC protocol precautions for Dr Duwaine  RED FLAGS: None  HAND DOMINANCE: Left  WEIGHT BEARING RESTRICTIONS: Yes NWB RUE  FALLS:  Has patient fallen in last 6 months? 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;  would like to return to playing golf (hasn't done since 2023)   OBJECTIVE: (objective measures completed at initial evaluation unless otherwise dated)  PATIENT SURVEYS:  Quick Dash = 81.8/100  COGNITION: Overall cognitive status: Within functional limits for tasks assessed     SENSATION: WFL  POSTURE: No Significant postural limitations  UPPER EXTREMITY ROM:   Passive ROM Right eval Left eval R 07/23/24 RUE 08/06/24 RUE 08/14/24 RUE 08/21/24 R 08/29/24  RUE 09/04/24  Shoulder flexion 75 180 90 120 130 133 143 149  Shoulder extension NT 55        Shoulder abduction  175  100 110 105 130 122  Shoulder adduction          Shoulder internal rotation Hand to stomach in sling 75   Hand to stomach;  30 degrees at 40 deg scaption At 45 deg scaption: 45 degrees 53 At 70 degrees ABD:  60-65 degrees  Shoulder external rotation 10 80 10 32 37 At 45 degrees scaption:  44 degrees  At 90 deg ABD:  70 deg;  At neutral:  50 degrees   Elbow flexion WNL = BUE         Elbow extension          Wrist flexion          Wrist extension          Wrist ulnar deviation          Wrist radial deviation          Wrist pronation          Wrist supination          (Blank rows = not tested)  UPPER EXTREMITY MMT: 07/10/24--No strength testing done at eval due to surgical precautions  MMT Right eval Left eval  Shoulder flexion    Shoulder extension    Shoulder abduction    Shoulder adduction    Shoulder internal rotation    Shoulder external rotation    Middle trapezius    Lower trapezius    Elbow flexion    Elbow extension    Wrist flexion    Wrist extension    Wrist ulnar deviation    Wrist radial deviation    Wrist pronation    Wrist supination    Grip strength (lbs)    (Blank rows = not tested)  SHOULDER SPECIAL TESTS:  N/A   JOINT MOBILITY TESTING:  N/A  PALPATION:  TTP over the anterior lateral deltoid area and upper traps    TODAY'S TREATMENT:  09/11/24 THERAPEUTIC EXERCISE: To improve ROM.  Demonstration, verbal and tactile cues throughout for technique. UBE 3'F/3'B Pulleys flexion and scaption x 2 min each  THERAPEUTIC ACTIVITIES: To improve functional performance.  Demonstration, verbal and tactile cues throughout for technique. Standing Tband pull downs YTB  forward extension x 20;  sideways ext (reverse abduction) x 20 RUE Seated Pulldowns YTB x 20 RUE Standing rowing YTB x 20 RUE  Seated wand flexion to 90 deg x 10 w/ mirror BUE Seated wand flexion to 140 deg x 10 w/ mirror BUE Seated ER x 30 w/ mirror RUE Prone HABD x 15 RUE w/ PT assistance for AAROM---patient advised to do Bent over HABD at home rather than prone exercises Prone high row x 15 RUE w/ PT assist for AAROM--patient advised to do Bent over HABD at home rather than prone exercises SL ER w/ towel roll x 30 RUE Supine press up x 2/10 RUE Supine circles at 90 CW x 15; CCW x 15 RUE Supine shoulder flexion x 2/10 RUE PT assisted  passive ER stretch at neutral to 60 deg RUE  09/06/24 UBE 3'F/3'B Pulleys flexion and scaption x 2 min each CCW circles on wall x 20 R shld Wall scaption x 10 R shld; wall flexion x 20 R shld Seated R shoulder ER AA with wand 2x74min holds Standing scap depression 10x5 Standing shoulder ext YTB x 10 Supine R shld flexion x 10 AROM Supine R shld CW/CCW circles x 10   PATIENT EDUCATION:  Education details: medbridge updated with new HEP Person educated: Patient Education method: Explanation, Demonstration, Verbal cues, Tactile cues, and Handouts Education comprehension: verbalized understanding, verbal cues required, tactile cues required, and needs further education  HOME EXERCISE PROGRAM: Access Code: [email or web address] URL: [email or web address] Date: 09/11/2024 Prepared by: Garnette Montclair  Exercises - Seated Shoulder Scaption AAROM with Pulley at Side  - 1 x daily - 7 x weekly - 3 sets - 10 reps - Circular Shoulder Pendulum with Table Support  - 1 x daily - 7 x weekly - 3 sets - 10 reps - Standing Bent Over Single Arm Scapular Row with Table Support  - 1 x daily - 7 x weekly - 3 sets - 10 reps - Seated Single Arm Shoulder External Rotation  - 1 x daily - 7 x weekly - 3 sets - 10 reps - Shoulder Flexion Wall Slide with Towel  - 1 x daily - 7 x weekly - 3 sets - 10 reps - Sidelying Shoulder External Rotation with Dumbbell  - 1 x daily - 7 x weekly - 3 sets - 10 reps - Supine Single Arm Shoulder Protraction  - 1 x daily - 7 x weekly - 3 sets - 10 reps - Supine Shoulder Flexion Extension Full Range AROM  - 1 x daily - 7 x weekly - 3 sets - 10 reps - Supine Shoulder Circles  - 1 x daily - 7 x weekly - 3 sets - 10 reps - Bent Shoulder Horizontal Abduction and Adduction with Table Support  - 1 x daily - 7 x weekly - 3 sets - 10 reps - Horizontal Shoulder Pendulum with Table Support  - 1 x daily - 7 x weekly - 3 sets - 10 reps ASSESSMENT:  CLINICAL IMPRESSION:  Patient is  doing very well with his pain and ROM progression.   He continues to exhibit a lot of compensatory muscle patterns with R shoulder AROM such as shrugging his shoulder, trunk rotation for ER, scapular winging w/ elevation, etc.   He responds well to mirror feedback as well as manual assist/guidance thru the motions of his exercises.  He can lift the RUE to 150 degrees flexion in supine, but only to 110 in sitting so he has a long way to go with strengthening.   PT remains necessary for ROM, strength, ADL/functional activities, HEP deficits.   Continue per POC per protocol  OBJECTIVE IMPAIRMENTS: decreased ROM, decreased strength, decreased safety awareness, increased edema, increased muscle spasms, impaired flexibility, impaired UE functional use, and pain.   ACTIVITY LIMITATIONS: carrying, lifting, sleeping, bathing, toileting, dressing, self feeding, reach over head, and hygiene/grooming  PARTICIPATION LIMITATIONS: meal prep, cleaning, laundry, driving, and shopping  PERSONAL FACTORS: Age and 1-2 comorbidities: HTN, sciatica, lumbar radiculopathy and chronic back pain are also affecting patient's functional outcome.   REHAB POTENTIAL: Good  CLINICAL DECISION MAKING: Evolving/moderate complexity  EVALUATION COMPLEXITY: Moderate   GOALS: Goals reviewed with patient? Yes  SHORT TERM GOALS: Target date: 09/04/2024  Patient will be independent with initial HEP to improve outcomes and carryover.  Baseline: 100% PT assist required for correct completion Goal status: MET- independent with most exercises but needs cues with scapular retraction 07/17/24;  08/06/24 independent  2.  Patient will report 25% improvement in R shoulder pain to improve QOL.   Baseline: 4/10  8/18:  2/10 Goal status: MET  3.  Patient will demonstrate R shoulder ROM (within protocol limitations) of 140 degrees flexion/scaption/abduction, 40 degrees ER, and IR to R buttock Baseline: see ROM table above 08/06/24:   reassessed.  See ROM tables above Goal status: PARTIALLY MET- 08/29/24  4.  Patient will demonstrate 3+/5 R shoulder strength for ability to do haircare, self hygiene, reaching above head  Baseline:  strength NT due to surgical precautions 09/04/24:  Has 3+ strength for R shoulder flexion, but has compensatory UT pattern of lifting  Goal Status:  IN PROGRESS  LONG TERM GOALS: Target date: (10/05/24)  Patient will be independent with ongoing/advanced HEP for self-management at home.  Baseline: no advanced HEP Yet 9/23-adding to HEP each visit Goal status: IN PROGRESS-   2.  Patient will report 50-75% improvement in R shoulder pain to improve QOL.  Baseline: 9/2:  0/10 today at rest;  2/10 with PROM Goal status: MET  4.  Patient to improve R shoulder AROM to WNL without pain provocation to allow for increased ease of ADLs.  Baseline: Refer to above UE ROM table Goal status: IN PROGRESS- 08/31/24  5.  Patient will demonstrate improved R shoulder strength to >/= 5/5 for functional UE use. Baseline: Refer to above UE MMT table Goal status: INITIAL  6  Patient will report </= 40 % on QuickDASH (MCID = 14%) to demonstrate improved functional ability.  Baseline: 81.8% Goal status: INITIAL  7.  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   Baseline: not allowed due to surgical restrictions Goal status: INITIAL   8.   Patient will demonstrate R shoulder AROM as follows:  170 degrees flexion/scaption, 160 deg ABD, 80 deg ER, IR to L4  Baseline:  see ROM tables  Goal status:  IN PROGRESS PLAN:  PT FREQUENCY: 1-2x/week  PT DURATION: other: 16 weeks  PLANNED INTERVENTIONS: 97164- PT Re-evaluation, 97750- Physical Performance Testing, 97110-Therapeutic exercises, 97530- Therapeutic activity, [id number]- Neuromuscular re-education, 97535- Self Care, 02859- Manual therapy, [id number]- Electrical stimulation (unattended), 97016-  Vasopneumatic device, [id number]- Ultrasound, 79439 (1-2 muscles), 20561 (3+ muscles)- Dry Needling, Patient/Family education, Taping, Joint mobilization, Scar mobilization, Cryotherapy, and Moist heat  PLAN FOR NEXT SESSION:  9 weeks post op 09/10/24.    Lottie Siska, PT 09/11/2024, 5:19 PM  Date of referral: 06/06/24 Referring provider: Duwaine Sharper Referring diagnosis? 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 (if different than referring diagnosis) R shoulder pain, R shoulder weakness, R shoulder stiffness  What was this (referring dx) caused by? Surgery (Type: R RTC repair, SAD/DCR)  Lysle of Condition: Initial Onset (within last 3 months)   Laterality: Rt  Current Functional Measure Score: DASH Quick Dash = 81.8/10  Objective measurements identify impairments when they are compared to normal values, the uninvolved extremity, and prior level of function.  [x]  Yes  []  No PROM R shoulder:  75 deg flexion; 60 deg ABD, 10 deg ER  Objective assessment of functional ability: Severe functional limitations   Briefly describe symptoms: New R RTC repair with SAD/DCR on 07/05/24; unable to use or move his R shoulder  How did symptoms start: surgery   Average pain intensity:  Last 24 hours: 4  Past week: 6  How often does the pt experience symptoms? Constantly  How much have the symptoms interfered with usual daily activities? Extremely  How has condition changed since care began at this facility? NA - initial visit  In general, how is the patients overall health? Good   BACK PAIN (STarT Back Screening Tool) No

## 2024-09-14 ENCOUNTER — Ambulatory Visit: Attending: Sports Medicine

## 2024-09-14 DIAGNOSIS — M6281 Muscle weakness (generalized): Secondary | ICD-10-CM | POA: Diagnosis present

## 2024-09-14 DIAGNOSIS — M25611 Stiffness of right shoulder, not elsewhere classified: Secondary | ICD-10-CM | POA: Diagnosis present

## 2024-09-14 DIAGNOSIS — M25511 Pain in right shoulder: Secondary | ICD-10-CM | POA: Diagnosis present

## 2024-09-14 NOTE — Therapy (Signed)
 OUTPATIENT PHYSICAL THERAPY SHOULDER TREATMENT    Patient Name: Phillip Hughes MRN: [id number] DOB:30-May-1956, 68 y.o., male Today's Date: 09/14/2024   END OF SESSION:  PT End of Session - 09/14/24 0933     Visit Number 14    Date for Recertification  10/30/24    Authorization Type [employer] MCR    PT Start Time 0847    PT Stop Time 0931    PT Time Calculation (min) 44 min    Activity Tolerance Patient tolerated treatment well    Behavior During Therapy [facility] for tasks assessed/performed               Past Medical History:  Diagnosis Date   Hypertension    Sciatica    Sinusitis    Past Surgical History:  Procedure Laterality Date   BACK SURGERY     HERNIA REPAIR     HIP SURGERY     NECK SURGERY     Patient Active Problem List   Diagnosis Date Noted   Hx of total hip arthroplasty, right 02/28/2023   Degenerative tear of medial meniscus of right knee 12/01/2022   Lumbar radiculopathy 11/29/2022   Peroneal mononeuropathy, 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    PCP: Dorina Loving, PA-C   REFERRING PROVIDER: Duwaine Ozell PARAS., MD   REFERRING DIAG: Tear of R rotator cuff, unspecified tear extent, unspecified whether traumatic (M75.101); 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); Subacromial bursitis R shoulder (M75.51); Chronic R shoulder pain (M25.511, G89.29)  THERAPY DIAG:  Acute pain of right shoulder  Muscle weakness (generalized)  Stiffness of right shoulder, not elsewhere classified  RATIONALE FOR EVALUATION AND TREATMENT: Rehabilitation  ONSET DATE: 07/05/[birth year]  NEXT MD VISIT: 10/16/24   SUBJECTIVE:                                                                                                                                                                                                         SUBJECTIVE STATEMENT: Shoulder doing well, leg still hurting  PAIN: Are you having pain? Yes: NPRS scale: 4/10 now and worst Pain location: R shoulder Pain description: soreness with  overhead movement Aggravating factors: any movements Relieving factors: rest, ice, meds  PERTINENT HISTORY:  R THA, Back surgery 2017, lumbar radiculopathy, Neck surgery, HTN  PRECAUTIONS: Shoulder RTC protocol precautions for Dr Duwaine  RED FLAGS: None  HAND DOMINANCE: Left  WEIGHT BEARING RESTRICTIONS: Yes NWB RUE  FALLS:  Has patient fallen in last 6 months? 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;  would like to return to playing golf (hasn't done since 2023)   OBJECTIVE: (objective measures completed at initial evaluation unless otherwise dated)  PATIENT SURVEYS:  Quick Dash = 81.8/100  COGNITION: Overall cognitive status: Within functional limits for tasks assessed     SENSATION: WFL  POSTURE: No Significant postural limitations  UPPER EXTREMITY ROM:   Passive ROM Right eval Left eval R 07/23/24 RUE 08/06/24 RUE 08/14/24 RUE 08/21/24 R 08/29/24  RUE 09/04/24  Shoulder flexion 75 180 90 120 130 133 143 149  Shoulder extension NT 55        Shoulder abduction  175  100 110 105 130 122  Shoulder adduction          Shoulder internal rotation Hand to stomach in sling 75   Hand to stomach;  30 degrees at 40 deg scaption At 45 deg scaption: 45 degrees 53 At 70 degrees ABD:  60-65 degrees  Shoulder external rotation 10 80 10 32 37 At 45 degrees scaption:  44 degrees  At 90 deg ABD:  70 deg;  At neutral:  50 degrees  Elbow flexion WNL = BUE         Elbow extension          Wrist flexion          Wrist extension          Wrist ulnar deviation          Wrist radial deviation          Wrist pronation          Wrist  supination          (Blank rows = not tested)  UPPER EXTREMITY MMT: 07/10/24--No strength testing done at eval due to surgical precautions  MMT Right eval Left eval  Shoulder flexion    Shoulder extension    Shoulder abduction    Shoulder adduction    Shoulder internal rotation    Shoulder external rotation    Middle trapezius    Lower trapezius    Elbow flexion    Elbow extension    Wrist flexion    Wrist extension    Wrist ulnar deviation    Wrist radial deviation    Wrist pronation    Wrist supination    Grip strength (lbs)    (Blank rows = not tested)  SHOULDER SPECIAL TESTS:  N/A   JOINT MOBILITY TESTING:  N/A  PALPATION:  TTP over the anterior lateral deltoid area and upper traps    TODAY'S TREATMENT:  09/13/24 UBE L1.5 3'F/3'B Pulleys flexion and scaption x 2 min each Seated R shoulder ext bent over x 20 Seated bent over R shoulder rows x 20 Seated R  bent over HABD x 10 Standing R shoulder ext x 20 YTB Standing R shoulder row x 20 YTB Supine R shoulder flexion 1lb x 10 Supine R shoulder CW/CCW circles x 10 1lb Supine R shoulder ER PROM staring 45 deg going to 90 deg ABD, PROM ABD as well  09/11/24 THERAPEUTIC EXERCISE: To improve ROM.  Demonstration, verbal and tactile cues throughout for technique. UBE 3'F/3'B Pulleys flexion and scaption x 2 min each  THERAPEUTIC ACTIVITIES: To improve functional performance.  Demonstration, verbal and tactile cues throughout  for technique. Standing Tband pull downs YTB  forward extension x 20;  sideways ext (reverse abduction) x 20 RUE Seated Pulldowns YTB x 20 RUE Standing rowing YTB x 20 RUE Seated wand flexion to 90 deg x 10 w/ mirror BUE Seated wand flexion to 140 deg x 10 w/ mirror BUE Seated ER x 30 w/ mirror RUE Prone HABD x 15 RUE w/ PT assistance for AAROM---patient advised to do Bent over HABD at home rather than prone exercises Prone high row x 15 RUE w/ PT assist for AAROM--patient advised to do Bent  over HABD at home rather than prone exercises SL ER w/ towel roll x 30 RUE Supine press up x 2/10 RUE Supine circles at 90 CW x 15; CCW x 15 RUE Supine shoulder flexion x 2/10 RUE PT assisted passive ER stretch at neutral to 60 deg RUE  09/06/24 UBE 3'F/3'B Pulleys flexion and scaption x 2 min each CCW circles on wall x 20 R shld Wall scaption x 10 R shld; wall flexion x 20 R shld Seated R shoulder ER AA with wand 2x41min holds Standing scap depression 10x5 Standing shoulder ext YTB x 10 Supine R shld flexion x 10 AROM Supine R shld CW/CCW circles x 10   PATIENT EDUCATION:  Education details: medbridge updated with new HEP Person educated: Patient Education method: Explanation, Demonstration, Verbal cues, Tactile cues, and Handouts Education comprehension: verbalized understanding, verbal cues required, tactile cues required, and needs further education  HOME EXERCISE PROGRAM: Access Code: [email or web address] URL: [email or web address] Date: 09/11/2024 Prepared by: Garnette Montclair  Exercises - Seated Shoulder Scaption AAROM with Pulley at Side  - 1 x daily - 7 x weekly - 3 sets - 10 reps - Circular Shoulder Pendulum with Table Support  - 1 x daily - 7 x weekly - 3 sets - 10 reps - Standing Bent Over Single Arm Scapular Row with Table Support  - 1 x daily - 7 x weekly - 3 sets - 10 reps - Seated Single Arm Shoulder External Rotation  - 1 x daily - 7 x weekly - 3 sets - 10 reps - Shoulder Flexion Wall Slide with Towel  - 1 x daily - 7 x weekly - 3 sets - 10 reps - Sidelying Shoulder External Rotation with Dumbbell  - 1 x daily - 7 x weekly - 3 sets - 10 reps - Supine Single Arm Shoulder Protraction  - 1 x daily - 7 x weekly - 3 sets - 10 reps - Supine Shoulder Flexion Extension Full Range AROM  - 1 x daily - 7 x weekly - 3 sets - 10 reps - Supine Shoulder Circles  - 1 x daily - 7 x weekly - 3 sets - 10 reps - Bent Shoulder Horizontal Abduction and Adduction with Table  Support  - 1 x daily - 7 x weekly - 3 sets - 10 reps - Horizontal Shoulder Pendulum with Table Support  - 1 x daily - 7 x weekly - 3 sets - 10 reps ASSESSMENT:  CLINICAL IMPRESSION:  Pt was able to lift 1lb weight starting today w/ no issues, in supine. He shows better ability to retract his scapula with one arm rows and pulldowns. Some continued restriction with ER and ABD PROM but ROM overall looking good. He continues to respond well to PT sessions, benefits from more strengthening at this point.  OBJECTIVE IMPAIRMENTS: decreased ROM, decreased strength, decreased safety awareness, increased edema, increased muscle spasms, impaired flexibility, impaired UE functional  use, and pain.   ACTIVITY LIMITATIONS: carrying, lifting, sleeping, bathing, toileting, dressing, self feeding, reach over head, and hygiene/grooming  PARTICIPATION LIMITATIONS: meal prep, cleaning, laundry, driving, and shopping  PERSONAL FACTORS: Age and 1-2 comorbidities: HTN, sciatica, lumbar radiculopathy and chronic back pain are also affecting patient's functional outcome.   REHAB POTENTIAL: Good  CLINICAL DECISION MAKING: Evolving/moderate complexity  EVALUATION COMPLEXITY: Moderate   GOALS: Goals reviewed with patient? Yes  SHORT TERM GOALS: Target date: 09/04/2024  Patient will be independent with initial HEP to improve outcomes and carryover.  Baseline: 100% PT assist required for correct completion Goal status: MET- independent with most exercises but needs cues with scapular retraction 07/17/24;  08/06/24 independent  2.  Patient will report 25% improvement in R shoulder pain to improve QOL.   Baseline: 4/10  8/18:  2/10 Goal status: MET  3.  Patient will demonstrate R shoulder ROM (within protocol limitations) of 140 degrees flexion/scaption/abduction, 40 degrees ER, and IR to R buttock Baseline: see ROM table above 08/06/24:  reassessed.  See ROM tables above Goal status: PARTIALLY MET- 08/29/24  4.   Patient will demonstrate 3+/5 R shoulder strength for ability to do haircare, self hygiene, reaching above head  Baseline:  strength NT due to surgical precautions 09/04/24:  Has 3+ strength for R shoulder flexion, but has compensatory UT pattern of lifting  Goal Status:  IN PROGRESS  LONG TERM GOALS: Target date: (10/05/24)  Patient will be independent with ongoing/advanced HEP for self-management at home.  Baseline: no advanced HEP Yet 9/23-adding to HEP each visit Goal status: IN PROGRESS-   2.  Patient will report 50-75% improvement in R shoulder pain to improve QOL.  Baseline: 9/2:  0/10 today at rest;  2/10 with PROM Goal status: MET  4.  Patient to improve R shoulder AROM to WNL without pain provocation to allow for increased ease of ADLs.  Baseline: Refer to above UE ROM table Goal status: IN PROGRESS- 08/31/24  5.  Patient will demonstrate improved R shoulder strength to >/= 5/5 for functional UE use. Baseline: Refer to above UE MMT table Goal status: INITIAL  6  Patient will report </= 40 % on QuickDASH (MCID = 14%) to demonstrate improved functional ability.  Baseline: 81.8% Goal status: INITIAL  7.  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   Patient will demonstrate R shoulder AROM as follows:  170 degrees flexion/scaption, 160 deg ABD, 80 deg ER, IR to L4  Baseline:  see ROM tables  Goal status:  IN PROGRESS PLAN:  PT FREQUENCY: 1-2x/week  PT DURATION: other: 16 weeks  PLANNED INTERVENTIONS: 97164- PT Re-evaluation, 97750- Physical Performance Testing, 97110-Therapeutic exercises, 97530- Therapeutic activity, [id number]- Neuromuscular re-education, 97535- Self Care, 02859- Manual therapy, G0283- Electrical stimulation (unattended), 97016- Vasopneumatic device, [id number]- Ultrasound, 79439  (1-2 muscles), 20561 (3+ muscles)- Dry Needling, Patient/Family education, Taping, Joint mobilization, Scar mobilization, Cryotherapy, and Moist heat  PLAN FOR NEXT SESSION:  9 weeks post op 09/10/24.    Shatara Stanek L Lama Narayanan, PTA 09/14/2024, 9:40 AM  Date of referral: 06/06/24 Referring provider: Duwaine Sharper Referring diagnosis? 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 (if different  than referring diagnosis) R shoulder pain, R shoulder weakness, R shoulder stiffness  What was this (referring dx) caused by? Surgery (Type: R RTC repair, SAD/DCR)  [place]le of Condition: Initial Onset (within last 3 months)   Laterality: Rt  Current Functional Measure Score: DASH Quick Dash = 81.8/10  Objective measurements identify impairments when they are compared to normal values, the uninvolved extremity, and prior level of function.  [x]  Yes  []  No PROM R shoulder:  75 deg flexion; 60 deg ABD, 10 deg ER  Objective assessment of functional ability: Severe functional limitations   Briefly describe symptoms: New R RTC repair with SAD/DCR on 07/05/24; unable to use or move his R shoulder  How did symptoms start: surgery   Average pain intensity:  Last 24 hours: 4  Past week: 6  How often does the pt experience symptoms? Constantly  How much have the symptoms interfered with usual daily activities? Extremely  How has condition changed since care began at this facility? NA - initial visit  In general, how is the patients overall health? Good   BACK PAIN (STarT Back Screening Tool) No

## 2024-09-17 ENCOUNTER — Ambulatory Visit (INDEPENDENT_AMBULATORY_CARE_PROVIDER_SITE_OTHER): Admitting: Orthopaedic Surgery

## 2024-09-17 DIAGNOSIS — M25551 Pain in right hip: Secondary | ICD-10-CM

## 2024-09-17 DIAGNOSIS — Z96641 Presence of right artificial hip joint: Secondary | ICD-10-CM

## 2024-09-17 NOTE — Progress Notes (Signed)
 The patient comes in to go over MRI of his right hip.  He has a right hip that was replaced through a posterior approach in [place] back in 2016.  He also has some chronic significant low back pain with known severe stenosis and has had surgery on his back in the past.  He has had a three-phase bone scan that ruled out prosthetic loosening and has had aspiration of that hip.  He came to us  for another opinion and recommended MRI of the hip.  The MRI was reviewed of his hip and it does not show any fluid collections.  Only shows very mild uptake in the femoral component but this is very mild.   On exam I can easily put his right hip through range of motion and he does not show any significant discomfort.  It does seem to be most of his pain is more spine related.  From my standpoint I do not recommend any type of surgical intervention for his right hip.  He certainly should follow-up with his spine specialist and even his hip specialist.  No other recommendations from my standpoint.

## 2024-09-18 ENCOUNTER — Ambulatory Visit: Payer: Self-pay

## 2024-09-18 ENCOUNTER — Encounter: Payer: Self-pay | Admitting: Rehabilitation

## 2024-09-18 ENCOUNTER — Ambulatory Visit: Admitting: Rehabilitation

## 2024-09-18 DIAGNOSIS — M25511 Pain in right shoulder: Secondary | ICD-10-CM

## 2024-09-18 DIAGNOSIS — M25611 Stiffness of right shoulder, not elsewhere classified: Secondary | ICD-10-CM

## 2024-09-18 DIAGNOSIS — M6281 Muscle weakness (generalized): Secondary | ICD-10-CM

## 2024-09-18 NOTE — Telephone Encounter (Signed)
 FYI Only or Action Required?: FYI only for provider.  Patient was last seen in primary care on 06/26/2024 by Dorina Loving, PA-C.  Called Nurse Triage reporting Cough.  Symptoms began several days ago.  Interventions attempted: Nothing.  Symptoms are: stable.  Triage Disposition: Home Care  Patient/caregiver understands and will follow disposition?: Yes  Copied from CRM [id number]. Topic: Clinical - Red Word Triage >> Sep 18, 2024  8:25 AM Frederich PARAS wrote: [facility] that prompted transfer to Nurse Triage: productive cough , chest congestion  congestion in chest,since back in surgery from july 2024. it gotten better but last weekend flared back up. He says it is worst at night. He also adv he has a productive cough. No pain in chest or wheezing. Reason for Disposition  Cough with cold symptoms (e.g., runny nose, postnasal drip, throat clearing)  Answer Assessment - Initial Assessment Questions 1. ONSET: When did the cough begin?      Started over the weekend, 4 days  2. SEVERITY: How bad is the cough today?      Persistent, now having chest congestion  3. SPUTUM: Describe the color of your sputum (e.g., none, dry cough; clear, white, yellow, green)     Clear sputum  4. HEMOPTYSIS: Are you coughing up any blood? If Yes, ask: How much? (e.g., flecks, streaks, tablespoons, etc.)     No  5. DIFFICULTY BREATHING: Are you having difficulty breathing? If Yes, ask: How bad is it? (e.g., mild, moderate, severe)      At night has some difficulty breathing when laying down. But no shortness of breath  6. FEVER: Do you have a fever? If Yes, ask: What is your temperature, how was it measured, and when did it start?     No  7. CARDIAC HISTORY: Do you have any history of heart disease? (e.g., heart attack, congestive heart failure)      No  8. LUNG HISTORY: Do you have any history of lung disease?  (e.g., pulmonary embolus, asthma, emphysema)     No  9. PE RISK  FACTORS: Do you have a history of blood clots? (or: recent major surgery, recent prolonged travel, bedridden)     No  10. OTHER SYMPTOMS: Do you have any other symptoms? (e.g., runny nose, wheezing, chest pain)       Chest congestion  11. PREGNANCY: Is there any chance you are pregnant? When was your last menstrual period?       N/a  12. TRAVEL: Have you traveled out of the country in the last month? (e.g., travel history, exposures)       No  Protocols used: Cough - Acute Productive-A-AH

## 2024-09-18 NOTE — Therapy (Signed)
 OUTPATIENT PHYSICAL THERAPY SHOULDER TREATMENT    Patient Name: Phillip Hughes MRN: [id number] DOB:August 31, 1956, 68 y.o., male Today's Date: 09/18/2024   END OF SESSION:  PT End of Session - 09/18/24 0847     Visit Number 15    Date for Recertification  10/30/24    Authorization Type [employer] MCR    PT Start Time 0845    PT Stop Time 0938    PT Time Calculation (min) 53 min    Activity Tolerance Patient tolerated treatment well;No increased pain    Behavior During Therapy WFL for tasks assessed/performed               Past Medical History:  Diagnosis Date   Hypertension    Sciatica    Sinusitis    Past Surgical History:  Procedure Laterality Date   BACK SURGERY     HERNIA REPAIR     HIP SURGERY     NECK SURGERY     Patient Active Problem List   Diagnosis Date Noted   Hx of total hip arthroplasty, right 02/28/2023   Degenerative tear of medial meniscus of right knee 12/01/2022   Lumbar radiculopathy 11/29/2022   Peroneal mononeuropathy, 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    PCP: Dorina Loving, PA-C   REFERRING PROVIDER: Duwaine Ozell PARAS., MD   REFERRING DIAG: Tear of R rotator cuff, unspecified tear extent, unspecified whether traumatic (M75.101); 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   States his back and RLE pain are what's bothering him the most.   Denies any falls or med changes.   Worst  shoulder pain over last week is 1/10.   Back/RLE pain today is 6/10.   He reports his R hip MRI did not show anything wrong   PAIN: Are you having pain? Yes: NPRS scale: 4/10 now and worst Pain location: R shoulder Pain description: soreness with overhead movement Aggravating factors: any movements Relieving factors: rest, ice, meds  PERTINENT HISTORY:  R THA, Back surgery 2017, lumbar radiculopathy,  Neck surgery, HTN  PRECAUTIONS: Shoulder RTC protocol precautions for Dr Duwaine  RED FLAGS: None  HAND DOMINANCE: Left  WEIGHT BEARING RESTRICTIONS: Yes NWB RUE  FALLS:  Has patient fallen in last 6 months? 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;  would like to return to playing golf (hasn't done since 2023)   OBJECTIVE: (objective measures completed at initial evaluation unless otherwise dated)  PATIENT SURVEYS:  Quick Dash = 81.8/100  COGNITION: Overall cognitive status: Within functional limits for tasks assessed     SENSATION: WFL  POSTURE: No Significant postural limitations  UPPER EXTREMITY ROM:   Passive ROM Right eval Left eval R 07/23/24 RUE 08/06/24 RUE 08/14/24 RUE 08/21/24 R 08/29/24  RUE 09/04/24 RUE 09/18/24  Shoulder flexion 75 180 90 120 130 133 143 149   Shoulder extension NT 55         Shoulder abduction  175  100 110 105 130 122   Shoulder adduction           Shoulder internal rotation Hand to stomach in sling 75   Hand to stomach;  30 degrees at 40 deg scaption At 45 deg scaption: 45 degrees 53 At 70 degrees ABD:  60-65 degrees Functional IR is L5  Shoulder external rotation 10 80 10 32 37 At 45  degrees scaption:  44 degrees  At 90 deg ABD:  70 deg;  At neutral:  50 degrees 60 deg at neutral  Elbow flexion WNL = BUE          Elbow extension           Wrist flexion           Wrist extension           Wrist ulnar deviation           Wrist radial deviation           Wrist pronation           Wrist supination           (Blank rows = not tested)  UPPER EXTREMITY MMT: 07/10/24--No strength testing done at eval due to surgical precautions  MMT Right eval Left eval  Shoulder flexion    Shoulder extension    Shoulder abduction    Shoulder adduction    Shoulder internal rotation    Shoulder external rotation    Middle trapezius    Lower trapezius    Elbow flexion    Elbow extension    Wrist flexion    Wrist extension    Wrist ulnar deviation    Wrist radial deviation    Wrist pronation    Wrist supination    Grip strength (lbs)    (Blank rows = not tested)  SHOULDER SPECIAL TESTS:  N/A   JOINT MOBILITY TESTING:  N/A  PALPATION:  TTP over the anterior lateral deltoid area and upper traps    TODAY'S TREATMENT:  09/18/24 THERAPEUTIC EXERCISE: To improve ROM.  Demonstration, verbal and tactile cues throughout for technique. UBE 3'F/3'B Pulleys flexion and scaption x 2 min each  THERAPEUTIC ACTIVITIES: To improve functional performance.  Demonstration, verbal and tactile cues throughout for technique. SL shoulder ER  0# x 30 RUE SL HABD 0# x 30 RUE Supine press up 2# x 30 RUE Supine flexion 2# x 2/10 w/ 5 sec holds RUE Supine circles at 90 1# x 30 RUE Supine ER at neutral 1# x 3/10 RUE Supine ER/IR at 90 abd x 20 RUE w/ PT facilitation for correct form and preventing elbow extension Wall slides x 20 flexion, x 20 CW, x 20 CCW RUE Seated table ER stretch x 1' x 2 RUE Standing strap IR stretch behind back x 30 sec x 3 RUE   09/13/24 UBE L1.5 3'F/3'B Pulleys flexion and scaption x 2 min each Seated R shoulder ext bent over x 20 Seated bent over R shoulder rows x  20 Seated R  bent over HABD x 10 Standing R shoulder ext x 20 YTB Standing R shoulder row x 20 YTB Supine R shoulder flexion 1lb x 10 Supine R shoulder CW/CCW circles x 10 1lb Supine R shoulder ER PROM staring 45 deg going to 90 deg ABD, PROM ABD as well  09/11/24 THERAPEUTIC EXERCISE: To improve ROM.  Demonstration, verbal and tactile cues throughout for technique. UBE 3'F/3'B Pulleys flexion and scaption x 2 min each  THERAPEUTIC ACTIVITIES: To improve functional performance.  Demonstration, verbal and tactile cues throughout for technique. Standing Tband pull downs YTB  forward extension x 20;  sideways ext (reverse abduction) x 20 RUE Seated Pulldowns YTB x 20 RUE Standing rowing YTB x 20 RUE Seated wand flexion to 90 deg x 10 w/ mirror BUE Seated wand flexion to 140 deg x 10 w/ mirror BUE Seated ER x 30 w/ mirror RUE Prone HABD x 15 RUE w/ PT assistance for AAROM---patient advised to do Bent over HABD at home rather than prone exercises Prone high row x 15 RUE w/ PT assist for AAROM--patient advised to do Bent over HABD at home rather than prone exercises SL ER w/ towel roll x 30 RUE Supine press up x 2/10 RUE Supine circles at 90 CW x 15; CCW x 15 RUE Supine shoulder flexion x 2/10 RUE PT assisted passive ER stretch at neutral to 60 deg RUE  09/06/24 UBE 3'F/3'B Pulleys flexion and scaption x 2 min each CCW circles on wall x 20 R shld Wall scaption x 10 R shld; wall flexion x 20 R shld Seated R shoulder ER AA with wand 2x37min holds Standing scap depression 10x5 Standing shoulder ext YTB x 10 Supine R shld flexion x 10 AROM Supine R shld CW/CCW circles x 10   PATIENT EDUCATION:  Education details: medbridge updated with new HEP Person educated: Patient Education method: Explanation, Demonstration, Verbal cues, Tactile cues, and Handouts Education comprehension: verbalized understanding, verbal cues required, tactile cues required, and needs further education  HOME  EXERCISE PROGRAM: Access Code: [email or web address] URL: [email or web address] Date: 09/18/2024 Prepared by: Garnette Montclair  Exercises - Seated Shoulder Scaption AAROM with Pulley at Side  - 1 x daily - 7 x weekly - 3 sets - 10 reps - Standing Bent Over Single Arm Scapular Row with Table Support  - 1 x daily - 7 x weekly - 3 sets - 10 reps - Seated Single Arm Shoulder External Rotation  - 1 x daily - 7 x weekly - 3 sets - 10 reps - Shoulder Flexion Wall Slide with Towel  - 1 x daily - 7 x weekly - 3 sets - 10 reps - Sidelying Shoulder External Rotation with Dumbbell  - 1 x daily - 7 x weekly -  3 sets - 10 reps - Supine Shoulder Flexion Extension Full Range AROM  - 1 x daily - 7 x weekly - 3 sets - 10 reps - Supine Shoulder Circles  - 1 x daily - 7 x weekly - 3 sets - 10 reps - Bent Shoulder Horizontal Abduction and Adduction with Table Support  - 1 x daily - 7 x weekly - 3 sets - 10 reps - Horizontal Shoulder Pendulum with Table Support  - 1 x daily - 7 x weekly - 3 sets - 10 reps - Supine Shoulder External Rotation in Abduction  - 1 x daily - 7 x weekly - 3 sets - 10 reps - Sidelying Shoulder Horizontal Abduction  - 1 x daily - 7 x weekly - 3 sets - 10 reps - Standing Shoulder Internal Rotation Stretch with Towel  - 1 x daily - 7 x weekly - 3 sets - 10 reps - Seated Shoulder External Rotation PROM on Table  - 1 x daily - 7 x weekly - 1 sets - 2 reps - 1 min hold - Standing Shoulder Row with Anchored Resistance  - 1 x daily - 7 x weekly - 3 sets - 10 reps - Seated High Lat Pull Down with Overhead Anchored Resistance  - 1 x daily - 7 x weekly - 3 sets - 10 reps ASSESSMENT:  CLINICAL IMPRESSION:  Patient denies any pain throughout his days.  However, he admits he feels some discomfort and burning with his exercises in PT today.   NO sharp pain though.   He still lacks end range r snoulder flexion.  Initiated some IR stretching today and patient lacks ROM here as well.   Strength is slowly  improving.   Needs to work into antigravity as able because he does not have greater than 3+ flexion and abduction strength in the RUE   OBJECTIVE IMPAIRMENTS: decreased ROM, decreased strength, decreased safety awareness, increased edema, increased muscle spasms, impaired flexibility, impaired UE functional use, and pain.   ACTIVITY LIMITATIONS: carrying, lifting, sleeping, bathing, toileting, dressing, self feeding, reach over head, and hygiene/grooming  PARTICIPATION LIMITATIONS: meal prep, cleaning, laundry, driving, and shopping  PERSONAL FACTORS: Age and 1-2 comorbidities: HTN, sciatica, lumbar radiculopathy and chronic back pain are also affecting patient's functional outcome.   REHAB POTENTIAL: Good  CLINICAL DECISION MAKING: Evolving/moderate complexity  EVALUATION COMPLEXITY: Moderate   GOALS: Goals reviewed with patient? Yes  SHORT TERM GOALS: Target date: 09/04/2024  Patient will be independent with initial HEP to improve outcomes and carryover.  Baseline: 100% PT assist required for correct completion Goal status: MET- independent with most exercises but needs cues with scapular retraction 07/17/24;  08/06/24 independent  2.  Patient will report 25% improvement in R shoulder pain to improve QOL.   Baseline: 4/10  8/18:  2/10 Goal status: MET  3.  Patient will demonstrate R shoulder ROM (within protocol limitations) of 140 degrees flexion/scaption/abduction, 40 degrees ER, and IR to R buttock Baseline: see ROM table above 08/06/24:  reassessed.  See ROM tables above Goal status: PARTIALLY MET- 08/29/24  4.  Patient will demonstrate 3+/5 R shoulder strength for ability to do haircare, self hygiene, reaching above head  Baseline:  strength NT due to surgical precautions 09/04/24:  Has 3+ strength for R shoulder flexion, but has compensatory UT pattern of lifting  Goal Status:  IN PROGRESS  LONG TERM GOALS: Target date: (10/05/24)  Patient will be independent with  ongoing/advanced HEP for self-management at  home.  Baseline: no advanced HEP Yet 9/23-adding to HEP each visit Goal status: IN PROGRESS-   2.  Patient will report 50-75% improvement in R shoulder pain to improve QOL.  Baseline: 9/2:  0/10 today at rest;  2/10 with PROM Goal status: MET  4.  Patient to improve R shoulder AROM to WNL without pain provocation to allow for increased ease of ADLs.  Baseline: Refer to above UE ROM table Goal status: IN PROGRESS- 08/31/24  5.  Patient will demonstrate improved R shoulder strength to >/= 5/5 for functional UE use. Baseline: Refer to above UE MMT table Goal status: INITIAL  6  Patient will report </= 40 % on QuickDASH (MCID = 14%) to demonstrate improved functional ability.  Baseline: 81.8% Goal status: INITIAL  7.  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   Patient will demonstrate R shoulder AROM as follows:  170 degrees flexion/scaption, 160 deg ABD, 80 deg ER, IR to L4  Baseline:  see ROM tables  Goal status:  IN PROGRESS PLAN:  PT FREQUENCY: 1-2x/week  PT DURATION: other: 16 weeks  PLANNED INTERVENTIONS: 97164- PT Re-evaluation, 97750- Physical Performance Testing, 97110-Therapeutic exercises, 97530- Therapeutic activity, [id number]- Neuromuscular re-education, 97535- Self Care, 02859- Manual therapy, [id number]- Electrical stimulation (unattended), 97016- Vasopneumatic device, [id number]- Ultrasound, 79439 (1-2 muscles), 20561 (3+ muscles)- Dry Needling, Patient/Family education, Taping, Joint mobilization, Scar mobilization, Cryotherapy, and Moist heat  PLAN FOR NEXT SESSION:  10 weeks post op 09/17/24.   Progress with antigravity flexion/abd strengthening, stretching   Darien Kading, PT 09/18/2024, 9:42 PM  Date of referral: 06/06/24 Referring provider:  Duwaine Sharper Referring diagnosis? 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 (if different than referring diagnosis) R shoulder pain, R shoulder weakness, R shoulder stiffness  What was this (referring dx) caused by? Surgery (Type: R RTC repair, SAD/DCR)  [place] of Condition: Initial Onset (within last 3 months)   Laterality: Rt  Current Functional Measure Score: DASH Quick Dash = 81.8/10  Objective measurements identify impairments when they are compared to normal values, the uninvolved extremity, and prior level of function.  [x]  Yes  []  No PROM R shoulder:  75 deg flexion; 60 deg ABD, 10 deg ER  Objective assessment of functional ability: Severe functional limitations   Briefly describe symptoms: New R RTC repair with SAD/DCR on 07/05/24; unable to use or move his R shoulder  How did symptoms start: surgery   Average pain intensity:  Last 24 hours: 4  Past week: 6  How often does the pt experience symptoms? Constantly  How much have the symptoms interfered with usual daily activities? Extremely  How has condition changed since care began at this facility? NA - initial visit  In general, how is the patients overall health? Good   BACK PAIN (STarT Back Screening Tool) No

## 2024-09-21 ENCOUNTER — Ambulatory Visit: Admitting: Rehabilitation

## 2024-09-21 ENCOUNTER — Encounter: Payer: Self-pay | Admitting: Rehabilitation

## 2024-09-21 DIAGNOSIS — M6281 Muscle weakness (generalized): Secondary | ICD-10-CM

## 2024-09-21 DIAGNOSIS — M25511 Pain in right shoulder: Secondary | ICD-10-CM

## 2024-09-21 DIAGNOSIS — M25611 Stiffness of right shoulder, not elsewhere classified: Secondary | ICD-10-CM

## 2024-09-21 NOTE — Therapy (Signed)
 OUTPATIENT PHYSICAL THERAPY SHOULDER TREATMENT    Patient Name: Phillip Hughes MRN: [id number] DOB:01-07-1956, 68 y.o., male Today's Date: 09/22/2024   END OF SESSION:  PT End of Session - 09/21/24 1007     Visit Number 16    Date for Recertification  10/30/24    Authorization Type [employer] MCR    PT Start Time 0930    PT Stop Time 1015    PT Time Calculation (min) 45 min    Activity Tolerance Patient tolerated treatment well;No increased pain    Behavior During Therapy WFL for tasks assessed/performed               Past Medical History:  Diagnosis Date   Hypertension    Sciatica    Sinusitis    Past Surgical History:  Procedure Laterality Date   BACK SURGERY     HERNIA REPAIR     HIP SURGERY     NECK SURGERY     Patient Active Problem List   Diagnosis Date Noted   Hx of total hip arthroplasty, right 02/28/2023   Degenerative tear of medial meniscus of right knee 12/01/2022   Lumbar radiculopathy 11/29/2022   Peroneal mononeuropathy, 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    PCP: Dorina Loving, PA-C   REFERRING PROVIDER: Duwaine Ozell PARAS., MD   REFERRING DIAG: Tear of R rotator cuff, unspecified tear extent, unspecified whether traumatic (M75.101); 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); Subacromial bursitis R shoulder (M75.51); 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   Just can't reach overhead with the RUE yet due to stiffness.   PAIN: Are you having  pain? Yes: NPRS scale: 4/10 now and worst Pain location: R shoulder Pain description: soreness with overhead movement Aggravating factors: any movements Relieving factors: rest, ice, meds  PERTINENT HISTORY:  R THA, Back surgery 2017, lumbar radiculopathy, Neck surgery, HTN  PRECAUTIONS: Shoulder RTC protocol precautions for Dr Duwaine  RED FLAGS: None  HAND DOMINANCE: Left  WEIGHT BEARING RESTRICTIONS: Yes NWB RUE  FALLS:  Has patient fallen in last  6 months? 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;  would like to return to playing golf (hasn't done since 2023)   OBJECTIVE: (objective measures completed at initial evaluation unless otherwise dated)  PATIENT SURVEYS:  Quick Dash = 81.8/100  COGNITION: Overall cognitive status: Within functional limits for tasks assessed     SENSATION: WFL  POSTURE: No Significant postural limitations  UPPER EXTREMITY ROM:   Passive ROM Right eval Left eval R 07/23/24 RUE 08/06/24 RUE 08/14/24 RUE 08/21/24 R 08/29/24  RUE 09/04/24 RUE 09/18/24  Shoulder flexion 75 180 90 120 130 133 143 149   Shoulder extension NT 55         Shoulder abduction  175  100 110 105 130 122   Shoulder adduction           Shoulder internal rotation Hand to stomach in sling 75   Hand to stomach;  30 degrees at 40 deg scaption At 45 deg scaption: 45 degrees 53 At 70 degrees ABD:  60-65 degrees Functional IR is L5  Shoulder external rotation 10 80 10 32 37 At 45 degrees scaption:  44 degrees  At 90 deg ABD:  70 deg;  At neutral:  50 degrees 60 deg at neutral  Elbow flexion WNL = BUE          Elbow extension            Wrist flexion           Wrist extension           Wrist ulnar deviation           Wrist radial deviation           Wrist pronation           Wrist supination           (Blank rows = not tested)  UPPER EXTREMITY ROM:  Active ROM RUE AROM 09/21/24  Shoulder flexion 110 w/ UT substitution  Shoulder extension 40  Shoulder abduction 85 w/ UT substitution  Shoulder adduction   Shoulder extension   Shoulder internal rotation To upper hip  Shoulder external rotation 45 deg at neutral  Elbow flexion   Elbow extension   Wrist flexion   Wrist extension   Wrist ulnar deviation   Wrist radial deviation   Wrist pronation   Wrist supination    (Blank rows = not tested)   UPPER EXTREMITY MMT: 07/10/24--No strength testing done at eval due to surgical precautions  MMT Right eval Left eval  Shoulder flexion    Shoulder extension    Shoulder abduction    Shoulder adduction    Shoulder internal rotation    Shoulder external rotation    Middle trapezius    Lower trapezius    Elbow flexion    Elbow extension    Wrist flexion    Wrist extension    Wrist ulnar deviation    Wrist radial deviation    Wrist pronation    Wrist supination    Grip strength (lbs)    (Blank rows = not tested)  SHOULDER SPECIAL TESTS:  N/A   JOINT MOBILITY TESTING:  N/A  PALPATION:  TTP over  the anterior lateral deltoid area and upper traps    TODAY'S TREATMENT:  09/21/24 THERAPEUTIC EXERCISE: To improve ROM.  Demonstration, verbal and tactile cues throughout for technique. UBE L2.0  x 3'F/3'B  THERAPEUTIC ACTIVITIES: To improve functional performance.  Demonstration, verbal and tactile cues throughout for technique. Standing shoulder ER YTB x 2/10 RUE with PT tactile cues for correct form (tends to straighten elbow without assist) Seated pulldowns blue TB x 2/10 RUE with PT tactile cues for pulldown w/ bent elbow only with elbow to chest Standing shoulder flexion AROM to 90 deg w/ mirror  and tactile cues for not using UT x 2/10 RUE Standing shoulder ABD AROM to 90 deg w/ mirror and tactile cues as above Doorway lift off shoulder HABD at 80 deg x 2/10 BUE Supine circles at 90 1# x 30 RUE Wall slide w/ lift off x 2/10 RUE Seated table ER stretch x 1' x 2 RUE Standing strap IR stretch behind back x 30 sec x 3 RUE  MANUAL THERAPY: To promote increased ROM utilizing joint mobilization. Light grade 3-4 R shoulder jt mobs for inferior glides and posterior glides at varying angles of shoulder flexion/scaption x 20-30 reps  09/18/24 THERAPEUTIC EXERCISE: To improve ROM.  Demonstration, verbal and tactile cues throughout for technique. UBE 3'F/3'B Pulleys flexion and scaption x 2 min each  THERAPEUTIC ACTIVITIES: To improve functional performance.  Demonstration, verbal and tactile cues throughout for technique. SL shoulder ER 0# x 30 RUE SL HABD 0# x 30 RUE Supine press up 2# x 30 RUE Supine flexion 2# x 2/10 w/ 5 sec holds RUE Supine circles at 90 1# x 30 RUE Supine ER at neutral 1# x 3/10 RUE Supine ER/IR at 90 abd x 20 RUE w/ PT facilitation for correct form and preventing elbow extension Wall slides x 20 flexion, x 20 CW, x 20 CCW RUE Seated table ER stretch x 1' x 2 RUE Standing strap IR stretch behind back x 30 sec x 3 RUE   09/13/24 UBE L1.5 3'F/3'B Pulleys flexion and scaption x 2 min each Seated R shoulder ext bent over x 20 Seated bent over R shoulder rows x 20 Seated R  bent over HABD x 10 Standing R shoulder ext x 20 YTB Standing R shoulder row x 20 YTB Supine R shoulder flexion 1lb x 10 Supine R shoulder CW/CCW circles x 10 1lb Supine R shoulder ER PROM staring 45 deg going to 90 deg ABD, PROM ABD as well  09/11/24 THERAPEUTIC EXERCISE: To improve ROM.  Demonstration, verbal and tactile cues throughout for technique. UBE 3'F/3'B Pulleys flexion and scaption x 2 min each  THERAPEUTIC ACTIVITIES: To improve functional performance.  Demonstration, verbal  and tactile cues throughout for technique. Standing Tband pull downs YTB  forward extension x 20;  sideways ext (reverse abduction) x 20 RUE Seated Pulldowns YTB x 20 RUE Standing rowing YTB x 20 RUE Seated wand flexion to 90 deg x 10 w/ mirror BUE Seated wand flexion to 140 deg x 10 w/ mirror BUE Seated ER x 30 w/ mirror RUE Prone HABD x 15 RUE w/ PT assistance for AAROM---patient advised to do Bent over HABD at home rather than prone exercises Prone high row x 15 RUE w/ PT assist for AAROM--patient advised to do Bent over HABD at home rather than prone exercises SL ER w/ towel roll x 30 RUE Supine press up x 2/10 RUE Supine circles at 90 CW x 15; CCW x 15 RUE  Supine shoulder flexion x 2/10 RUE PT assisted passive ER stretch at neutral to 60 deg RUE  09/06/24 UBE 3'F/3'B Pulleys flexion and scaption x 2 min each CCW circles on wall x 20 R shld Wall scaption x 10 R shld; wall flexion x 20 R shld Seated R shoulder ER AA with wand 2x51min holds Standing scap depression 10x5 Standing shoulder ext YTB x 10 Supine R shld flexion x 10 AROM Supine R shld CW/CCW circles x 10   PATIENT EDUCATION:  Education details: medbridge updated with new HEP Person educated: Patient Education method: Explanation, Demonstration, Verbal cues, Tactile cues, and Handouts Education comprehension: verbalized understanding, verbal cues required, tactile cues required, and needs further education  HOME EXERCISE PROGRAM: Access Code: [email or web address] URL: [email or web address] Date: 09/18/2024 Prepared by: Garnette Montclair  Exercises - Seated Shoulder Scaption AAROM with Pulley at Side  - 1 x daily - 7 x weekly - 3 sets - 10 reps - Standing Bent Over Single Arm Scapular Row with Table Support  - 1 x daily - 7 x weekly - 3 sets - 10 reps - Seated Single Arm Shoulder External Rotation  - 1 x daily - 7 x weekly - 3 sets - 10 reps - Shoulder Flexion Wall Slide with Towel  - 1 x daily - 7 x weekly - 3  sets - 10 reps - Sidelying Shoulder External Rotation with Dumbbell  - 1 x daily - 7 x weekly - 3 sets - 10 reps - Supine Shoulder Flexion Extension Full Range AROM  - 1 x daily - 7 x weekly - 3 sets - 10 reps - Supine Shoulder Circles  - 1 x daily - 7 x weekly - 3 sets - 10 reps - Bent Shoulder Horizontal Abduction and Adduction with Table Support  - 1 x daily - 7 x weekly - 3 sets - 10 reps - Horizontal Shoulder Pendulum with Table Support  - 1 x daily - 7 x weekly - 3 sets - 10 reps - Supine Shoulder External Rotation in Abduction  - 1 x daily - 7 x weekly - 3 sets - 10 reps - Sidelying Shoulder Horizontal Abduction  - 1 x daily - 7 x weekly - 3 sets - 10 reps - Standing Shoulder Internal Rotation Stretch with Towel  - 1 x daily - 7 x weekly - 3 sets - 10 reps - Seated Shoulder External Rotation PROM on Table  - 1 x daily - 7 x weekly - 1 sets - 2 reps - 1 min hold - Standing Shoulder Row with Anchored Resistance  - 1 x daily - 7 x weekly - 3 sets - 10 reps - Seated High Lat Pull Down with Overhead Anchored Resistance  - 1 x daily - 7 x weekly - 3 sets - 10 reps ASSESSMENT:  CLINICAL IMPRESSION:  Progressing with antigravity shoulder strengthening this week.  His pain is doing very well.   He feels a little discomfort with shoulder mobilizations but all mobs are kept within his tolerance.  He lacks strength and continues to exhibit UT substitution pattern with shoulder elevated motions.   Will need considerable more work on this, but he is doing very well overall within his protocol instructions.   PT remains necessary for strength, neuroreed for proper shoulder mechanics, and residual ROM deficits.   Continue per POC   OBJECTIVE IMPAIRMENTS: decreased ROM, decreased strength, decreased safety awareness, increased edema, increased muscle spasms, impaired flexibility, impaired UE functional use, and pain.  ACTIVITY LIMITATIONS: carrying, lifting, sleeping, bathing, toileting, dressing, self  feeding, reach over head, and hygiene/grooming  PARTICIPATION LIMITATIONS: meal prep, cleaning, laundry, driving, and shopping  PERSONAL FACTORS: Age and 1-2 comorbidities: HTN, sciatica, lumbar radiculopathy and chronic back pain are also affecting patient's functional outcome.   REHAB POTENTIAL: Good  CLINICAL DECISION MAKING: Evolving/moderate complexity  EVALUATION COMPLEXITY: Moderate   GOALS: Goals reviewed with patient? Yes  SHORT TERM GOALS: Target date: 09/04/2024  Patient will be independent with initial HEP to improve outcomes and carryover.  Baseline: 100% PT assist required for correct completion Goal status: MET- independent with most exercises but needs cues with scapular retraction 07/17/24;  08/06/24 independent  2.  Patient will report 25% improvement in R shoulder pain to improve QOL.   Baseline: 4/10  8/18:  2/10 Goal status: MET  3.  Patient will demonstrate R shoulder ROM (within protocol limitations) of 140 degrees flexion/scaption/abduction, 40 degrees ER, and IR to R buttock Baseline: see ROM table above 08/06/24:  reassessed.  See ROM tables above Goal status: PARTIALLY MET- 08/29/24  4.  Patient will demonstrate 3+/5 R shoulder strength for ability to do haircare, self hygiene, reaching above head  Baseline:  strength NT due to surgical precautions 09/04/24:  Has 3+ strength for R shoulder flexion, but has compensatory UT pattern of lifting  Goal Status:  IN PROGRESS  LONG TERM GOALS: Target date: (10/05/24)  Patient will be independent with ongoing/advanced HEP for self-management at home.  Baseline: no advanced HEP Yet 9/23-adding to HEP each visit Goal status: IN PROGRESS-   2.  Patient will report 50-75% improvement in R shoulder pain to improve QOL.  Baseline: 9/2:  0/10 today at rest;  2/10 with PROM Goal status: MET  4.  Patient to improve R shoulder AROM to WNL without pain provocation to allow for increased ease of ADLs.  Baseline: Refer  to above UE ROM table Goal status: IN PROGRESS- 08/31/24  5.  Patient will demonstrate improved R shoulder strength to >/= 5/5 for functional UE use. Baseline: Refer to above UE MMT table Goal status: INITIAL  6  Patient will report </= 40 % on QuickDASH (MCID = 14%) to demonstrate improved functional ability.  Baseline: 81.8% Goal status: INITIAL  7.  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   Patient will demonstrate R shoulder AROM as follows:  170 degrees flexion/scaption, 160 deg ABD, 80 deg ER, IR to L4  Baseline:  see ROM tables  09/21/24:  see updated AROM table above  Goal status:  IN PROGRESS PLAN:  PT FREQUENCY: 1-2x/week  PT DURATION: other: 16 weeks  PLANNED INTERVENTIONS: 97164- PT Re-evaluation, 97750- Physical Performance Testing, 97110-Therapeutic exercises, 97530- Therapeutic activity, [id number]- Neuromuscular re-education, 97535- Self Care, [id number]- Manual therapy, [id number]- Electrical stimulation (unattended), 97016- Vasopneumatic device, [id number]- Ultrasound, 79439 (1-2 muscles), 20561 (3+ muscles)- Dry Needling, Patient/Family education, Taping, Joint mobilization, Scar mobilization, Cryotherapy, and Moist heat  PLAN FOR NEXT SESSION:  11 weeks post op 09/24/24.  Review table slide flexion stretching, try to add LT lift offs if able or modify for LT and MT strengthening; antigravity deltoid/RTC strengthening;  do quickdash  Jasmina Gendron, PT 09/22/2024, 11:36 AM  Date of referral: 06/06/24 Referring provider: Duwaine Sharper Referring diagnosis? 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 (if different than  referring diagnosis) R shoulder pain, R shoulder weakness, R shoulder stiffness  What was this (referring dx) caused by? Surgery (Type: R RTC repair, SAD/DCR)  Lysle of Condition: Initial Onset (within last 3 months)   Laterality: Rt  Current Functional Measure Score: DASH Quick Dash = 81.8/10  Objective measurements identify impairments when they are compared to normal values, the uninvolved extremity, and prior level of function.  [x]  Yes  []  No PROM R shoulder:  75 deg flexion; 60 deg ABD, 10 deg ER  Objective assessment of functional ability: Severe functional limitations   Briefly describe symptoms: New R RTC repair with SAD/DCR on 07/05/24; unable to use or move his R shoulder  How did symptoms start: surgery   Average pain intensity:  Last 24 hours: 4  Past week: 6  How often does the pt experience symptoms? Constantly  How much have the symptoms interfered with usual daily activities? Extremely  How has condition changed since care began at this facility? NA - initial visit  In general, how is the patients overall health? Good   BACK PAIN (STarT Back Screening Tool) No

## 2024-09-25 ENCOUNTER — Ambulatory Visit

## 2024-09-25 DIAGNOSIS — M25511 Pain in right shoulder: Secondary | ICD-10-CM

## 2024-09-25 DIAGNOSIS — M25611 Stiffness of right shoulder, not elsewhere classified: Secondary | ICD-10-CM

## 2024-09-25 DIAGNOSIS — M6281 Muscle weakness (generalized): Secondary | ICD-10-CM

## 2024-09-25 NOTE — Therapy (Signed)
 OUTPATIENT PHYSICAL THERAPY SHOULDER TREATMENT    Patient Name: Phillip Hughes MRN: [id number] DOB:[birth year], 68 y.o., male Today's Date: 09/25/2024   END OF SESSION:  PT End of Session - 09/25/24 0850     Visit Number 17    Date for Recertification  10/30/24    Authorization Type [employer] MCR    PT Start Time 0847    PT Stop Time 0930    PT Time Calculation (min) 43 min    Activity Tolerance Patient tolerated treatment well;No increased pain    Behavior During Therapy WFL for tasks assessed/performed               Past Medical History:  Diagnosis Date   Hypertension    Sciatica    Sinusitis    Past Surgical History:  Procedure Laterality Date   BACK SURGERY     HERNIA REPAIR     HIP SURGERY     NECK SURGERY     Patient Active Problem List   Diagnosis Date Noted   Hx of total hip arthroplasty, right 02/28/2023   Degenerative tear of medial meniscus of right knee 12/01/2022   Lumbar radiculopathy 11/29/2022   Peroneal mononeuropathy, 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    PCP: Dorina Loving, PA-C   REFERRING PROVIDER: Duwaine Ozell PARAS., MD   REFERRING DIAG: Tear of R rotator cuff, unspecified tear extent, unspecified whether traumatic (M75.101); 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); Subacromial bursitis R shoulder (M75.51); 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    PAIN: Are you having pain? Yes: NPRS scale: 4/10 now and worst Pain location: R  shoulder Pain description: soreness with overhead movement Aggravating factors: any movements Relieving factors: rest, ice, meds  PERTINENT HISTORY:  R THA, Back surgery 2017, lumbar radiculopathy, Neck surgery, HTN  PRECAUTIONS: Shoulder RTC protocol precautions for Dr Duwaine  RED FLAGS: None  HAND DOMINANCE: Left  WEIGHT BEARING RESTRICTIONS: Yes NWB RUE  FALLS:  Has patient fallen in last 6 months? 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;  would like to return to playing golf (hasn't done since 2023)   OBJECTIVE: (objective measures completed at initial evaluation unless otherwise dated)  PATIENT SURVEYS:  Quick Dash = 81.8/100  COGNITION: Overall cognitive status: Within functional limits for tasks assessed     SENSATION: WFL  POSTURE: No Significant postural limitations  UPPER EXTREMITY ROM:   Passive ROM Right eval Left eval R 07/23/24 RUE 08/06/24 RUE 08/14/24 RUE 08/21/24 R 08/29/24  RUE 09/04/24 RUE 09/18/24  Shoulder flexion 75 180 90 120 130 133 143 149   Shoulder extension NT 55         Shoulder abduction  175  100 110 105 130 122   Shoulder adduction           Shoulder internal rotation Hand to stomach in sling 75   Hand to stomach;  30 degrees at 40 deg scaption At 45 deg scaption: 45 degrees 53 At 70 degrees ABD:  60-65 degrees Functional IR is L5  Shoulder external rotation 10 80 10 32 37 At 45 degrees scaption:  44 degrees  At 90 deg ABD:  70 deg;  At neutral:  50 degrees 60 deg at neutral  Elbow flexion WNL = BUE          Elbow extension           Wrist flexion           Wrist extension            Wrist ulnar deviation           Wrist radial deviation           Wrist pronation           Wrist supination           (Blank rows = not tested)  UPPER EXTREMITY ROM:  Active ROM RUE AROM 09/21/24 AROM 09/25/24  Shoulder flexion 110 w/ UT substitution 112 with shrug  Shoulder extension 40   Shoulder abduction 85 w/ UT substitution 88 with shrug  Shoulder adduction    Shoulder extension    Shoulder internal rotation To upper hip To C6  Shoulder external rotation 45 deg at neutral To lumbar spine parallel to iliac crest  Elbow flexion    Elbow extension    Wrist flexion    Wrist extension    Wrist ulnar deviation    Wrist radial deviation    Wrist pronation    Wrist supination     (Blank rows = not tested)   UPPER EXTREMITY MMT: 07/10/24--No strength testing done at eval due to surgical precautions  MMT Right 10/14 Left eval  Shoulder flexion 4   Shoulder extension    Shoulder abduction 4   Shoulder adduction    Shoulder internal rotation 4+   Shoulder external rotation 4+   Middle trapezius    Lower trapezius    Elbow flexion    Elbow extension    Wrist flexion    Wrist extension    Wrist ulnar deviation    Wrist radial deviation    Wrist pronation    Wrist supination    Grip strength (lbs)    (Blank rows = not tested)  SHOULDER  SPECIAL TESTS:  N/A   JOINT MOBILITY TESTING:  N/A  PALPATION:  TTP over the anterior lateral deltoid area and upper traps    TODAY'S TREATMENT:  09/25/24 THERAPEUTIC EXERCISE: UBE L2.0  x 3'F/3'B PROM to R shoulder flex, ABD, ER full ROM  THERAPEUTIC ACTIVITIES: Seated prayer shoulder flexion x 10 - with mirror Wall slide flexion x 10; scaption x 10 Seated R shoulder flexion AROM 2 x 10- with mirror Standing shoulder ext + ABD RTB  Measured R shoulder ROM and tested strength  09/21/24 THERAPEUTIC EXERCISE: To improve ROM.  Demonstration, verbal and tactile cues throughout for technique. UBE L2.0  x  3'F/3'B  THERAPEUTIC ACTIVITIES: To improve functional performance.  Demonstration, verbal and tactile cues throughout for technique. Standing shoulder ER YTB x 2/10 RUE with PT tactile cues for correct form (tends to straighten elbow without assist) Seated pulldowns blue TB x 2/10 RUE with PT tactile cues for pulldown w/ bent elbow only with elbow to chest Standing shoulder flexion AROM to 90 deg w/ mirror and tactile cues for not using UT x 2/10 RUE Standing shoulder ABD AROM to 90 deg w/ mirror and tactile cues as above Doorway lift off shoulder HABD at 80 deg x 2/10 BUE Supine circles at 90 1# x 30 RUE Wall slide w/ lift off x 2/10 RUE Seated table ER stretch x 1' x 2 RUE Standing strap IR stretch behind back x 30 sec x 3 RUE  MANUAL THERAPY: To promote increased ROM utilizing joint mobilization. Light grade 3-4 R shoulder jt mobs for inferior glides and posterior glides at varying angles of shoulder flexion/scaption x 20-30 reps  09/18/24 THERAPEUTIC EXERCISE: To improve ROM.  Demonstration, verbal and tactile cues throughout for technique. UBE 3'F/3'B Pulleys flexion and scaption x 2 min each  THERAPEUTIC ACTIVITIES: To improve functional performance.  Demonstration, verbal and tactile cues throughout for technique. SL shoulder ER 0# x 30 RUE SL HABD 0# x 30 RUE Supine press up 2# x 30 RUE Supine flexion 2# x 2/10 w/ 5 sec holds RUE Supine circles at 90 1# x 30 RUE Supine ER at neutral 1# x 3/10 RUE Supine ER/IR at 90 abd x 20 RUE w/ PT facilitation for correct form and preventing elbow extension Wall slides x 20 flexion, x 20 CW, x 20 CCW RUE Seated table ER stretch x 1' x 2 RUE Standing strap IR stretch behind back x 30 sec x 3 RUE   09/13/24 UBE L1.5 3'F/3'B Pulleys flexion and scaption x 2 min each Seated R shoulder ext bent over x 20 Seated bent over R shoulder rows x 20 Seated R  bent over HABD x 10 Standing R shoulder ext x 20 YTB Standing R shoulder row x 20  YTB Supine R shoulder flexion 1lb x 10 Supine R shoulder CW/CCW circles x 10 1lb Supine R shoulder ER PROM staring 45 deg going to 90 deg ABD, PROM ABD as well  09/11/24 THERAPEUTIC EXERCISE: To improve ROM.  Demonstration, verbal and tactile cues throughout for technique. UBE 3'F/3'B Pulleys flexion and scaption x 2 min each  THERAPEUTIC ACTIVITIES: To improve functional performance.  Demonstration, verbal and tactile cues throughout for technique. Standing Tband pull downs YTB  forward extension x 20;  sideways ext (reverse abduction) x 20 RUE Seated Pulldowns YTB x 20 RUE Standing rowing YTB x 20 RUE Seated wand flexion to 90 deg x 10 w/ mirror BUE Seated wand flexion to 140 deg x 10 w/ mirror  BUE Seated ER x 30 w/ mirror RUE Prone HABD x 15 RUE w/ PT assistance for AAROM---patient advised to do Bent over HABD at home rather than prone exercises Prone high row x 15 RUE w/ PT assist for AAROM--patient advised to do Bent over HABD at home rather than prone exercises SL ER w/ towel roll x 30 RUE Supine press up x 2/10 RUE Supine circles at 90 CW x 15; CCW x 15 RUE Supine shoulder flexion x 2/10 RUE PT assisted passive ER stretch at neutral to 60 deg RUE  09/06/24 UBE 3'F/3'B Pulleys flexion and scaption x 2 min each CCW circles on wall x 20 R shld Wall scaption x 10 R shld; wall flexion x 20 R shld Seated R shoulder ER AA with wand 2x76min holds Standing scap depression 10x5 Standing shoulder ext YTB x 10 Supine R shld flexion x 10 AROM Supine R shld CW/CCW circles x 10   PATIENT EDUCATION:  Education details: medbridge updated with new HEP Person educated: Patient Education method: Explanation, Demonstration, Verbal cues, Tactile cues, and Handouts Education comprehension: verbalized understanding, verbal cues required, tactile cues required, and needs further education  HOME EXERCISE PROGRAM: Access Code: [email or web address] URL: [email or web address] Date:  09/18/2024 Prepared by: Garnette Montclair  Exercises - Seated Shoulder Scaption AAROM with Pulley at Side  - 1 x daily - 7 x weekly - 3 sets - 10 reps - Standing Bent Over Single Arm Scapular Row with Table Support  - 1 x daily - 7 x weekly - 3 sets - 10 reps - Seated Single Arm Shoulder External Rotation  - 1 x daily - 7 x weekly - 3 sets - 10 reps - Shoulder Flexion Wall Slide with Towel  - 1 x daily - 7 x weekly - 3 sets - 10 reps - Sidelying Shoulder External Rotation with Dumbbell  - 1 x daily - 7 x weekly - 3 sets - 10 reps - Supine Shoulder Flexion Extension Full Range AROM  - 1 x daily - 7 x weekly - 3 sets - 10 reps - Supine Shoulder Circles  - 1 x daily - 7 x weekly - 3 sets - 10 reps - Bent Shoulder Horizontal Abduction and Adduction with Table Support  - 1 x daily - 7 x weekly - 3 sets - 10 reps - Horizontal Shoulder Pendulum with Table Support  - 1 x daily - 7 x weekly - 3 sets - 10 reps - Supine Shoulder External Rotation in Abduction  - 1 x daily - 7 x weekly - 3 sets - 10 reps - Sidelying Shoulder Horizontal Abduction  - 1 x daily - 7 x weekly - 3 sets - 10 reps - Standing Shoulder Internal Rotation Stretch with Towel  - 1 x daily - 7 x weekly - 3 sets - 10 reps - Seated Shoulder External Rotation PROM on Table  - 1 x daily - 7 x weekly - 1 sets - 2 reps - 1 min hold - Standing Shoulder Row with Anchored Resistance  - 1 x daily - 7 x weekly - 3 sets - 10 reps - Seated High Lat Pull Down with Overhead Anchored Resistance  - 1 x daily - 7 x weekly - 3 sets - 10 reps ASSESSMENT:  CLINICAL IMPRESSION:  Worked on scapular kinesthesia with movement to avoid shrugging with arm elevation. His pain has improved very much since beginning PT. He still demonstrates compensations when elevating his R arm, reports stiffness with full  AROM which is required for ADLs. We have recently begun strengthening phase of protocol, he demonstrates weakness in R shoulder and tightness with functional  ROM. He continues to benefit from skilled PT to address deficits with strength, ROM, and proprioception to restore PLOF.   OBJECTIVE IMPAIRMENTS: decreased ROM, decreased strength, decreased safety awareness, increased edema, increased muscle spasms, impaired flexibility, impaired UE functional use, and pain.   ACTIVITY LIMITATIONS: carrying, lifting, sleeping, bathing, toileting, dressing, self feeding, reach over head, and hygiene/grooming  PARTICIPATION LIMITATIONS: meal prep, cleaning, laundry, driving, and shopping  PERSONAL FACTORS: Age and 1-2 comorbidities: HTN, sciatica, lumbar radiculopathy and chronic back pain are also affecting patient's functional outcome.   REHAB POTENTIAL: Good  CLINICAL DECISION MAKING: Evolving/moderate complexity  EVALUATION COMPLEXITY: Moderate   GOALS: Goals reviewed with patient? Yes  SHORT TERM GOALS: Target date: 09/04/2024  Patient will be independent with initial HEP to improve outcomes and carryover.  Baseline: 100% PT assist required for correct completion Goal status: MET- independent with most exercises but needs cues with scapular retraction 07/17/24;  08/06/24 independent  2.  Patient will report 25% improvement in R shoulder pain to improve QOL.   Baseline: 4/10  8/18:  2/10 Goal status: MET  3.  Patient will demonstrate R shoulder ROM (within protocol limitations) of 140 degrees flexion/scaption/abduction, 40 degrees ER, and IR to R buttock Baseline: see ROM table above 08/06/24:  reassessed.  See ROM tables above Goal status: PARTIALLY MET- 08/29/24  4.  Patient will demonstrate 3+/5 R shoulder strength for ability to do haircare, self hygiene, reaching above head  Baseline:  strength NT due to surgical precautions 09/04/24:  Has 3+ strength for R shoulder flexion, but has compensatory UT pattern of lifting  Goal Status:  MET- 09/25/24  LONG TERM GOALS: Target date: (10/05/24)  Patient will be independent with ongoing/advanced  HEP for self-management at home.  Baseline: no advanced HEP Yet 9/23-adding to HEP each visit Goal status: IN PROGRESS- 09/25/24 met for current  2.  Patient will report 50-75% improvement in R shoulder pain to improve QOL.  Baseline: 9/2:  0/10 today at rest;  2/10 with PROM Goal status: MET  4.  Patient to improve R shoulder AROM to WNL without pain provocation to allow for increased ease of ADLs.  Baseline: Refer to above UE ROM table Goal status: IN PROGRESS- 09/25/24  5.  Patient will demonstrate improved R shoulder strength to >/= 5/5 for functional UE use. Baseline: Refer to above UE MMT table Goal status: IN PROGRESS- 09/25/24  6  Patient will report </= 40 % on QuickDASH (MCID = 14%) to demonstrate improved functional ability.  Baseline: 81.8% Goal status: MET- 34.1 / 100 = 34.1 % 09/25/24  7.  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   Patient will demonstrate R shoulder AROM as follows:  170 degrees flexion/scaption, 160 deg ABD, 80 deg ER, IR to L4  Baseline:  see ROM tables  09/21/24:  see updated AROM table above  Goal status:  IN PROGRESS- 09/25/24 PLAN:  PT FREQUENCY: 1-2x/week  PT DURATION: other: 16 weeks  PLANNED INTERVENTIONS: 97164- PT Re-evaluation, 97750- Physical Performance Testing, 97110-Therapeutic exercises, 97530- Therapeutic activity, [id number]- Neuromuscular re-education, 97535- Self Care, [id number]- Manual therapy, [id number]- Electrical stimulation (unattended), 97016- Vasopneumatic device,  02964- Ultrasound, 79439 (1-2 muscles), 20561 (3+ muscles)- Dry Needling, Patient/Family education, Taping, Joint mobilization, Scar mobilization, Cryotherapy, and Moist heat  PLAN FOR NEXT SESSION:  11 weeks post op 09/24/24.  Review table slide flexion stretching, try to add LT lift offs  if able or modify for LT and MT strengthening; antigravity deltoid/RTC strengthening;  Kaylise Blakeley L Harvel Meskill, PTA 09/25/2024, 9:31 AM  Date of referral: 06/06/24 Referring provider: Duwaine Sharper Referring diagnosis? 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 (if different than referring diagnosis) R shoulder pain, R shoulder weakness, R shoulder stiffness  What was this (referring dx) caused by? Surgery (Type: R RTC repair, SAD/DCR)  [place] of Condition: Initial Onset (within last 3 months)   Laterality: Rt  Current Functional Measure Score: DASH Quick Dash = 34.1 / 100 = 34.1 %  Objective measurements identify impairments when they are compared to normal values, the uninvolved extremity, and prior level of function.  [x]  Yes  []  No PROM R shoulder:  75 deg flexion; 60 deg ABD, 10 deg ER  Objective assessment of functional ability: Severe functional limitations   Briefly describe symptoms: New R RTC repair with SAD/DCR on 07/05/24; unable to use or move his R shoulder  How did symptoms start: surgery   Average pain intensity:  Last 24 hours: 3/10  Past week: 3/10  How often does the pt experience symptoms? Occasionally  How much have the symptoms interfered with usual daily activities? A little bit  How has condition changed since care began at this facility? Much better  In general, how is the patients overall health? Good   BACK PAIN (STarT Back Screening Tool) No

## 2024-09-28 ENCOUNTER — Encounter: Payer: Self-pay | Admitting: Rehabilitation

## 2024-09-28 ENCOUNTER — Ambulatory Visit: Admitting: Rehabilitation

## 2024-09-28 DIAGNOSIS — M25511 Pain in right shoulder: Secondary | ICD-10-CM

## 2024-09-28 DIAGNOSIS — M6281 Muscle weakness (generalized): Secondary | ICD-10-CM

## 2024-09-28 DIAGNOSIS — M25611 Stiffness of right shoulder, not elsewhere classified: Secondary | ICD-10-CM

## 2024-09-28 NOTE — Therapy (Signed)
 OUTPATIENT PHYSICAL THERAPY SHOULDER TREATMENT / RECERTIFICATION  Progress Note Reporting Period 08/14/24 to 09/25/24  See note below for Objective Data and Assessment of Progress/Goals.      Patient Name: Phillip Hughes MRN: [id number] DOB:24-Jul-1956, 68 y.o., male Today's Date: 09/28/2024   END OF SESSION:  PT End of Session - 09/28/24 0801     Visit Number 18    Date for Recertification  10/30/24    Authorization Type [employer] MCR    PT Start Time 9243    Activity Tolerance Patient tolerated treatment well;No increased pain    Behavior During Therapy WFL for tasks assessed/performed               Past Medical History:  Diagnosis Date   Hypertension    Sciatica    Sinusitis    Past Surgical History:  Procedure Laterality Date   BACK SURGERY     HERNIA REPAIR     HIP SURGERY     NECK SURGERY     Patient Active Problem List   Diagnosis Date Noted   Hx of total hip arthroplasty, right 02/28/2023   Degenerative tear of medial meniscus of right knee 12/01/2022   Lumbar radiculopathy 11/29/2022   Peroneal mononeuropathy, 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    PCP: Dorina Loving, PA-C   REFERRING PROVIDER: Duwaine Ozell PARAS., MD   REFERRING DIAG: Tear of R rotator cuff, unspecified tear extent, unspecified whether traumatic (M75.101); 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); Subacromial bursitis R shoulder (M75.51); 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     PAIN: Are you having pain? Yes: NPRS scale: 4/10 now and worst Pain location: R shoulder Pain description: soreness with overhead movement Aggravating factors: any movements Relieving factors: rest, ice, meds  PERTINENT HISTORY:  R THA, Back surgery 2017, lumbar radiculopathy, Neck surgery, HTN  PRECAUTIONS: Shoulder RTC protocol precautions for Dr Duwaine  RED FLAGS: None  HAND DOMINANCE: Left  WEIGHT BEARING RESTRICTIONS: Yes NWB RUE  FALLS:  Has patient fallen in last 6 months? 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;  would like to return to playing golf (hasn't done since 2023)   OBJECTIVE: (objective measures completed at initial evaluation unless otherwise dated)  PATIENT SURVEYS:  Quick Dash = 81.8/100  COGNITION: Overall cognitive status: Within functional limits for tasks assessed     SENSATION: WFL  POSTURE: No Significant postural limitations  UPPER EXTREMITY ROM:   Passive ROM Right eval Left eval R 07/23/24 RUE 08/06/24 RUE 08/14/24 RUE 08/21/24 R 08/29/24  RUE 09/04/24 RUE 09/18/24 RUE 09/28/24  Shoulder flexion 75 180 90 120 130 133 143 149  154  Shoulder extension NT 55          Shoulder abduction  175  100 110 105 130 122  133  Shoulder adduction            Shoulder internal rotation Hand to stomach in sling 75   Hand to stomach;  30 degrees at 40 deg scaption At 45 deg scaption: 45 degrees 53 At 70 degrees ABD:  60-65 degrees Functional IR is L5 To L4 with strap stretch  Shoulder external rotation 10 80 10 32 37 At 45 degrees scaption:  44 degrees  At 90 deg ABD:  70 deg;  At neutral:  50  degrees 60 deg at neutral 65 deg at neutral; 75 at 90 deg ABD  Elbow flexion WNL = BUE           Elbow extension            Wrist flexion            Wrist extension            Wrist ulnar deviation            Wrist radial deviation            Wrist pronation            Wrist supination            (Blank rows = not tested)  UPPER EXTREMITY ROM:  Active ROM RUE AROM 09/21/24 AROM 09/25/24 RUE 09/28/24  Shoulder flexion 110 w/ UT substitution 112 with shrug 115 with shrug  Shoulder extension 40    Shoulder abduction 85 w/ UT substitution 88 with shrug 85 deg with shrug  Shoulder adduction     Shoulder extension     Shoulder internal rotation To upper hip To C6 SI joint  Shoulder external rotation 45 deg at neutral To lumbar spine parallel to iliac crest 49 deg at 0 deg Abd;  50 deg on table at 90 deg ABD  Elbow flexion     Elbow extension     Wrist flexion     Wrist extension     Wrist ulnar deviation     Wrist radial deviation     Wrist pronation     Wrist supination      (Blank rows = not tested)   UPPER EXTREMITY MMT: 07/10/24--No strength testing done at eval due to surgical precautions  MMT Right 10/14 Left eval RUE 09/28/24  Shoulder flexion 4  4  Shoulder extension     Shoulder abduction 4  4-  Shoulder adduction  Shoulder internal rotation 4+  4+  Shoulder external rotation 4+  4  Middle trapezius     Lower trapezius     Elbow flexion     Elbow extension     Wrist flexion     Wrist extension     Wrist ulnar deviation     Wrist radial deviation     Wrist pronation     Wrist supination     Grip strength (lbs)     (Blank rows = not tested)  SHOULDER SPECIAL TESTS:  N/A   JOINT MOBILITY TESTING:  N/A  PALPATION:  TTP over the anterior lateral deltoid area and upper traps    TODAY'S TREATMENT:  09/28/24 THERAPEUTIC EXERCISE: UBE L2.5  x 3'F/3'B   THERAPEUTIC ACTIVITIES: To improve functional performance.  Demonstration, verbal and tactile  cues throughout for technique. Rowing low grip 25# x 2/10;   high grip 15# x 2/10  RUE Lat PD 15# x 10 RUE Door ER stretch at 0 deg ABD x 1' x 2 RUE Door ER stretch at 90 deg x 1' BUE Table slide flexion stretch x 1' x 2 RUE Table slide ABD stretch x 1' x  2 RUE Seated table ER stretch at 90 x 1' RUE Standing strap IR stretch x 1' x 2 RUE  NEUROMUSCULAR RE-EDUCATION: To improve kinesthesia and proprioception. Wall ball roll flexion x 10 reps Standing shoulder flexion BUE w/ mirroring and PT manual facilitation to inhibit scapular elevation Standing shoulder cane flexion BUE w/ mirroring and PT facilitation Seated cane shoulder press up BUE w/ mirroring and manual cues Seated active press up RUE w/ mirroring x 2/10 RUE  MANUAL THERAPY: To promote improved flexibility and improved joint mobility utilizing joint mobilization. Supine grade 3-4 R shoulder mobilizations for inferior and posterior glides at end range flexion and abduction x 20-30 reps  09/25/24 THERAPEUTIC EXERCISE: UBE L2.0  x 3'F/3'B PROM to R shoulder flex, ABD, ER full ROM  THERAPEUTIC ACTIVITIES: Seated prayer shoulder flexion x 10 - with mirror Wall slide flexion x 10; scaption x 10 Seated R shoulder flexion AROM 2 x 10- with mirror Standing shoulder ext + ABD RTB  Measured R shoulder ROM and tested strength PATIENT EDUCATION:  Education details: medbridge updated with new HEP Person educated: Patient Education method: Explanation, Demonstration, Verbal cues, Tactile cues, and Handouts Education comprehension: verbalized understanding, verbal cues required, tactile cues required, and needs further education  HOME EXERCISE PROGRAM: Access Code: [email or web address] URL: [email or web address] Date: 09/28/2024 Prepared by: Garnette Montclair  Exercises - Seated Shoulder Scaption AAROM with Pulley at Side  - 1 x daily - 7 x weekly - 3 sets - 10 reps - Seated Single Arm Shoulder External Rotation  - 1 x daily  - 7 x weekly - 3 sets - 10 reps - Sidelying Shoulder External Rotation with Dumbbell  - 1 x daily - 7 x weekly - 3 sets - 10 reps - Supine Shoulder External Rotation in Abduction  - 1 x daily - 7 x weekly - 3 sets - 10 reps - Sidelying Shoulder Horizontal Abduction  - 1 x daily - 7 x weekly - 3 sets - 10 reps - Seated Shoulder External Rotation PROM on Table  - 1 x daily - 7 x weekly - 1 sets - 2 reps - 1 min hold - Standing Shoulder External Rotation Stretch in Doorway  - 1 x daily - 7 x weekly - 1 sets - 2 reps - 1 min hold -  Seated Shoulder Flexion Slide at Table Top with Forearm in Neutral  - 1 x daily - 7 x weekly - 1 sets - 2 reps - 1 min hold - Seated Shoulder Abduction Towel Slide at Table Top  - 1 x daily - 7 x weekly - 1 sets - 2 reps - 1 min hold - Doorway Pec Stretch at 90 Degrees Abduction  - 1 x daily - 7 x weekly - 1 sets - 2 reps - 1 min hold - Standing Shoulder Internal Rotation Stretch with Towel  - 1 x daily - 7 x weekly - 3 sets - 10 reps - Standing Shoulder Flexion to 90 Degrees  - 1 x daily - 7 x weekly - 2 sets - 10 reps - Shoulder Abduction - Thumbs Up  - 1 x daily - 7 x weekly - 2 sets - 10 reps - Standing Shoulder Press  - 1 x daily - 7 x weekly - 2 sets - 10 reps - Bent Shoulder Horizontal Abduction and Adduction with Table Support  - 1 x daily - 7 x weekly - 3 sets - 10 reps - Standing Shoulder Row with Anchored Resistance  - 1 x daily - 7 x weekly - 3 sets - 10 reps - Seated High Lat Pull Down with Overhead Anchored Resistance  - 1 x daily - 7 x weekly - 1 sets - 2 reps ASSESSMENT:  CLINICAL IMPRESSION:  Patient has been seen almost 3 months S/P R RCR by Dr Duwaine.    He is progressing well per his rehab protocol.   PROM is getting closer to full.   AROM demonstrates weakness with compensatory scapular elevation.   He is unable to move the R shoulder actively through its full range of motion antigravity yet even with compensatory movements.   There is still significant  weakness, but he was PROM only for the first 6 weeks, so the weakness is expected.   He has only been AROM for the last 4 weeks.   Now that he is getting out of the protective phase of the [facility] protocol, we can be more aggressive with his strengthening and I expect that he is going to progress very well over the next month.   He is pleased with his progress and is not having any R shoulder pain.   Plan to continue with PT for deficits in R shoulder strength, ADL ability, and residual ROM/flexibility/joint mobility deficits.   He is agreeable to the plan.  I would expect his insurance company to approve at least another 12 visits since we have only used 18 thus far, and initial visits were limited to PROM only.  OBJECTIVE IMPAIRMENTS: decreased ROM, decreased strength, decreased safety awareness, increased edema, increased muscle spasms, impaired flexibility, impaired UE functional use, and pain.   ACTIVITY LIMITATIONS: carrying, lifting, sleeping, bathing, toileting, dressing, self feeding, reach over head, and hygiene/grooming  PARTICIPATION LIMITATIONS: meal prep, cleaning, laundry, driving, and shopping  PERSONAL FACTORS: Age and 1-2 comorbidities: HTN, sciatica, lumbar radiculopathy and chronic back pain are also affecting patient's functional outcome.   REHAB POTENTIAL: Good  CLINICAL DECISION MAKING: Evolving/moderate complexity  EVALUATION COMPLEXITY: Moderate   GOALS: Goals reviewed with patient? Yes  SHORT TERM GOALS: Target date: 10/26/2024   Patient will have R shoulder ROM as follows:  175 degrees flexion; 160 deg ABD; 90 deg ER at neutral and at 90 deg abd; 70  deg IR at 90 deg abd  Baseline:  see  ROM tables above Goal status:  IN PROGRESS  2.  Patient will demonstrate 4+/5 R shoulder strength for ability to do haircare, self hygiene, reaching above head Baseline:  has 4-/5 R deltoid strength at 90 degress, but is exhibits compensatory shoulder shrugging and cannot  lift the RUE throughout full available PROM  Goal Status:  IN PROGRESS  LONG TERM GOALS: Target date: 11/23/2024   Patient will be independent with ongoing/advanced HEP for self-management at home.  Baseline: 10/17:  continuing to advance weekly Goal status: IN PROGRESS-  2.  Patient to improve R shoulder AROM to equal to the L shoulder without pain provocation to allow for increased ease of ADLs.  Baseline: Refer to above UE ROM table Goal status: IN PROGRESS-  3.  Patient will demonstrate improved R shoulder strength to >/= 5/5 for functional UE use. Baseline: Refer to above UE MMT table Goal status: IN PROGRESS- 09/25/24  4.  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   Baseline: not allowed due to surgical restrictions Goal status: IN PROGRESS- 09/14/24 able to lift 1lb supine    PLAN:  PT FREQUENCY: 1-2x/week  PT DURATION: other: 16 weeks  PLANNED INTERVENTIONS: 97164- PT Re-evaluation, 97750- Physical Performance Testing, 97110-Therapeutic exercises, 97530- Therapeutic activity, [id number]- Neuromuscular re-education, 97535- Self Care, [id number]- Manual therapy, [id number]- Electrical stimulation (unattended), 97016- Vasopneumatic device, [id number]- Ultrasound, 79439 (1-2 muscles), 20561 (3+ muscles)- Dry Needling, Patient/Family education, Taping, Joint mobilization, Scar mobilization, Cryotherapy, and Moist heat  PLAN FOR NEXT SESSION:  12 weeks post op 10/01/24.  Antigravity strengthening of deltoid, add LT lift offs; add wall/ball circles with small ball, supine or seated angels  Arvie Bartholomew, PT 09/28/2024, 8:03 AM  Date of referral: 06/06/24 Referring provider: Duwaine Sharper Referring diagnosis? 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 (if different than referring diagnosis) R shoulder pain, R shoulder weakness, R shoulder stiffness  What was this (referring dx) caused by? Surgery (Type: R RTC repair, SAD/DCR)  Lysle of Condition: Initial Onset (within last 3 months)   Laterality: Rt  Current Functional Measure Score: DASH Quick Dash = 34.1 / 100 = 34.1 %  Objective measurements identify impairments when they are compared to normal values, the uninvolved extremity, and prior level of function.  [x]  Yes  []  No PROM R shoulder:  75 deg flexion; 60 deg ABD, 10 deg ER  Objective assessment of functional ability: Severe functional limitations   Briefly describe symptoms: New R RTC repair with SAD/DCR on 07/05/24; unable to use or move his R shoulder  How did symptoms start: surgery   Average pain intensity:  Last 24 hours: 3/10  Past week: 3/10  How often does the pt experience symptoms? Occasionally  How much have the symptoms interfered with usual daily activities? A little bit  How has condition changed since care began at this facility? Much better  In general, how is the patients overall health? Good   BACK PAIN (STarT Back Screening Tool) No

## 2024-10-02 ENCOUNTER — Ambulatory Visit

## 2024-10-02 DIAGNOSIS — M25511 Pain in right shoulder: Secondary | ICD-10-CM | POA: Diagnosis not present

## 2024-10-02 DIAGNOSIS — M6281 Muscle weakness (generalized): Secondary | ICD-10-CM

## 2024-10-02 DIAGNOSIS — M25611 Stiffness of right shoulder, not elsewhere classified: Secondary | ICD-10-CM

## 2024-10-02 NOTE — Therapy (Signed)
 OUTPATIENT PHYSICAL THERAPY SHOULDER TREATMENT      Patient Name: Phillip Hughes MRN: [id number] DOB:July 25, 1956, 68 y.o., male Today's Date: 10/02/2024   END OF SESSION:  PT End of Session - 10/02/24 0846     Visit Number 19    Date for Recertification  10/30/24    Authorization Type [employer] [facility]    Authorization Time Period 10/17-11/14    Authorization - Visit Number 2    PT Start Time 0841    PT Stop Time 0925    PT Time Calculation (min) 44 min    Activity Tolerance Patient tolerated treatment well;No increased pain    Behavior During Therapy WFL for tasks assessed/performed                Past Medical History:  Diagnosis Date   Hypertension    Sciatica    Sinusitis    Past Surgical History:  Procedure Laterality Date   BACK SURGERY     HERNIA REPAIR     HIP SURGERY     NECK SURGERY     Patient Active Problem List   Diagnosis Date Noted   Hx of total hip arthroplasty, right 02/28/2023   Degenerative tear of medial meniscus of right knee 12/01/2022   Lumbar radiculopathy 11/29/2022   Peroneal mononeuropathy, 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    PCP: Dorina Loving, PA-C   REFERRING PROVIDER: Duwaine Ozell PARAS., MD   REFERRING DIAG: Tear of R rotator cuff, unspecified tear extent, unspecified whether traumatic (M75.101); 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); Subacromial bursitis R shoulder (M75.51); Chronic R shoulder pain (M25.511, G89.29)  THERAPY DIAG:  Acute pain of right shoulder  Muscle weakness (generalized)  Stiffness of right shoulder, not elsewhere classified  RATIONALE FOR EVALUATION AND TREATMENT: Rehabilitation  ONSET DATE: 07/05/54  NEXT MD VISIT: 10/16/24   SUBJECTIVE:                                                                                                                                                                                                         SUBJECTIVE STATEMENT: R shoulder wore out today    PAIN: Are you having pain? Yes: NPRS  scale: 4/10 now and worst Pain location: R shoulder Pain description: soreness with overhead movement Aggravating factors: any movements Relieving factors: rest, ice, meds  PERTINENT HISTORY:  R THA, Back surgery 2017, lumbar radiculopathy, Neck surgery, HTN  PRECAUTIONS: Shoulder RTC protocol precautions for Dr Duwaine  RED FLAGS: None  HAND DOMINANCE: Left  WEIGHT BEARING RESTRICTIONS: Yes NWB RUE  FALLS:  Has patient fallen in last  6 months? 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;  would like to return to playing golf (hasn't done since 2023)   OBJECTIVE: (objective measures completed at initial evaluation unless otherwise dated)  PATIENT SURVEYS:  Quick Dash = 81.8/100  COGNITION: Overall cognitive status: Within functional limits for tasks assessed     SENSATION: WFL  POSTURE: No Significant postural limitations  UPPER EXTREMITY ROM:   Passive ROM Right eval Left eval R 07/23/24 RUE 08/06/24 RUE 08/14/24 RUE 08/21/24 R 08/29/24  RUE 09/04/24 RUE 09/18/24 RUE 09/28/24  Shoulder flexion 75 180 90 120 130 133 143 149  154  Shoulder extension NT 55          Shoulder abduction  175  100 110 105 130 122  133  Shoulder adduction            Shoulder internal rotation Hand to stomach in sling 75   Hand to stomach;  30 degrees at 40 deg scaption At 45 deg scaption: 45 degrees 53 At 70 degrees ABD:  60-65 degrees Functional IR is L5 To L4 with strap stretch  Shoulder external rotation 10 80 10 32 37 At 45 degrees scaption:  44 degrees  At 90 deg ABD:  70 deg;  At neutral:  50 degrees 60 deg at neutral 65 deg at neutral;  75 at 90 deg ABD  Elbow flexion WNL = BUE           Elbow extension            Wrist flexion            Wrist extension            Wrist ulnar deviation            Wrist radial deviation            Wrist pronation            Wrist supination            (Blank rows = not tested)  UPPER EXTREMITY ROM:  Active ROM RUE AROM 09/21/24 AROM 09/25/24 RUE 09/28/24  Shoulder flexion 110 w/ UT substitution 112 with shrug 115 with shrug  Shoulder extension 40    Shoulder abduction 85 w/ UT substitution 88 with shrug 85 deg with shrug  Shoulder adduction     Shoulder extension     Shoulder internal rotation To upper hip To C6 SI joint  Shoulder external rotation 45 deg at neutral To lumbar spine parallel to iliac crest 49 deg at 0 deg Abd;  50 deg on table at 90 deg ABD  Elbow flexion     Elbow extension     Wrist flexion     Wrist extension     Wrist ulnar deviation     Wrist radial deviation     Wrist pronation     Wrist supination      (Blank rows = not tested)   UPPER EXTREMITY MMT: 07/10/24--No strength testing done at eval due to surgical precautions  MMT Right 10/14 Left eval RUE 09/28/24  Shoulder flexion 4  4  Shoulder extension     Shoulder abduction 4  4-  Shoulder adduction     Shoulder internal rotation 4+  4+  Shoulder external rotation 4+  4  Middle trapezius     Lower trapezius     Elbow flexion     Elbow extension     Wrist flexion     Wrist extension     Wrist ulnar deviation     Wrist radial deviation     Wrist pronation     Wrist supination     Grip strength (lbs)     (Blank rows = not tested)  SHOULDER SPECIAL TESTS:  N/A   JOINT MOBILITY TESTING:  N/A  PALPATION:  TTP over the anterior lateral deltoid area and upper traps    TODAY'S TREATMENT:  10/02/24 UBE L2.5 46F/3B Standing R shld ER stretch in doorway x 1 min Wall slide R shld flexion x 10; scaption x 10 Standing press 1lb bar x 10  Sidelying R shoulder flexion x 10 no weight; 1lb  x 10 Sidelying R shoulder horiz ABD x 10; 1lb x 10 Sidelying R shld ER 1lb 2x10 towel at side Prone R rows 4lb 2x10 Prone R shld ext 3lb 2x10  PROM to R shoulder flex, abd, ER   09/28/24 THERAPEUTIC EXERCISE: UBE L2.5  x 3'F/3'B   THERAPEUTIC ACTIVITIES: To improve functional performance.  Demonstration, verbal and tactile cues throughout for technique.  Rowing low grip 25# x 2/10;   high grip 15# x 2/10  RUE Lat PD 15# x 10 RUE Door ER stretch at 0 deg ABD x 1' x 2 RUE Door ER stretch at 90 deg x 1' BUE Table slide flexion stretch x 1' x 2 RUE Table slide ABD stretch x 1' x  2 RUE Seated table ER stretch at 90 x 1' RUE Standing strap IR stretch x 1' x 2 RUE  NEUROMUSCULAR RE-EDUCATION: To improve kinesthesia and proprioception. Wall ball roll flexion x 10 reps Standing shoulder flexion BUE w/ mirroring and PT manual facilitation to inhibit scapular elevation Standing shoulder cane flexion BUE w/ mirroring and PT facilitation Seated cane shoulder press up BUE w/ mirroring and manual cues Seated active press up RUE w/ mirroring x 2/10 RUE  MANUAL THERAPY: To promote improved flexibility and improved joint mobility utilizing joint mobilization. Supine grade 3-4 R shoulder mobilizations for inferior and posterior glides at end range flexion and abduction x 20-30 reps  09/25/24 THERAPEUTIC EXERCISE: UBE L2.0  x 3'F/3'B PROM to R shoulder flex, ABD, ER full ROM  THERAPEUTIC ACTIVITIES: Seated prayer shoulder flexion x 10 - with mirror Wall slide flexion x 10; scaption x 10 Seated R shoulder flexion AROM 2 x 10- with mirror Standing shoulder ext + ABD RTB  Measured R shoulder ROM and tested strength PATIENT EDUCATION:  Education details: medbridge updated with new HEP Person educated: Patient Education method: Explanation, Demonstration, Verbal cues, Tactile cues, and Handouts Education comprehension: verbalized understanding, verbal cues required, tactile cues required, and  needs further education  HOME EXERCISE PROGRAM: Access Code: [email or web address] URL: [email or web address] Date: 09/28/2024 Prepared by: Garnette Montclair  Exercises - Seated Shoulder Scaption AAROM with Pulley at Side  - 1 x daily - 7 x weekly - 3 sets - 10 reps - Seated Single Arm Shoulder External Rotation  - 1 x daily - 7 x weekly - 3 sets - 10 reps - Sidelying Shoulder External Rotation with Dumbbell  - 1 x daily - 7 x weekly - 3 sets - 10 reps - Supine Shoulder External Rotation in Abduction  -  1 x daily - 7 x weekly - 3 sets - 10 reps - Sidelying Shoulder Horizontal Abduction  - 1 x daily - 7 x weekly - 3 sets - 10 reps - Seated Shoulder External Rotation PROM on Table  - 1 x daily - 7 x weekly - 1 sets - 2 reps - 1 min hold - Standing Shoulder External Rotation Stretch in Doorway  - 1 x daily - 7 x weekly - 1 sets - 2 reps - 1 min hold - Seated Shoulder Flexion Slide at Table Top with Forearm in Neutral  - 1 x daily - 7 x weekly - 1 sets - 2 reps - 1 min hold - Seated Shoulder Abduction Towel Slide at Table Top  - 1 x daily - 7 x weekly - 1 sets - 2 reps - 1 min hold - Doorway Pec Stretch at 90 Degrees Abduction  - 1 x daily - 7 x weekly - 1 sets - 2 reps - 1 min hold - Standing Shoulder Internal Rotation Stretch with Towel  - 1 x daily - 7 x weekly - 3 sets - 10 reps - Standing Shoulder Flexion to 90 Degrees  - 1 x daily - 7 x weekly - 2 sets - 10 reps - Shoulder Abduction - Thumbs Up  - 1 x daily - 7 x weekly - 2 sets - 10 reps - Standing Shoulder Press  - 1 x daily - 7 x weekly - 2 sets - 10 reps - Bent Shoulder Horizontal Abduction and Adduction with Table Support  - 1 x daily - 7 x weekly - 3 sets - 10 reps - Standing Shoulder Row with Anchored Resistance  - 1 x daily - 7 x weekly - 3 sets - 10 reps - Seated High Lat Pull Down with Overhead Anchored Resistance  - 1 x daily - 7 x weekly - 1 sets - 2 reps ASSESSMENT:  CLINICAL IMPRESSION: Advanced treatment to work motor  control of R shoulder complex to improve scapulohumeral rhythm and proper mechanics to allow for proper functional use of R shoulder. Pt responded well, continues to require manual and verb cues for proper shoulder mechanics during elevation. Continues to have some tightness in end range flex and abd.  OBJECTIVE IMPAIRMENTS: decreased ROM, decreased strength, decreased safety awareness, increased edema, increased muscle spasms, impaired flexibility, impaired UE functional use, and pain.   ACTIVITY LIMITATIONS: carrying, lifting, sleeping, bathing, toileting, dressing, self feeding, reach over head, and hygiene/grooming  PARTICIPATION LIMITATIONS: meal prep, cleaning, laundry, driving, and shopping  PERSONAL FACTORS: Age and 1-2 comorbidities: HTN, sciatica, lumbar radiculopathy and chronic back pain are also affecting patient's functional outcome.   REHAB POTENTIAL: Good  CLINICAL DECISION MAKING: Evolving/moderate complexity  EVALUATION COMPLEXITY: Moderate   GOALS: Goals reviewed with patient? Yes  SHORT TERM GOALS: Target date: 10/26/2024   Patient will have R shoulder ROM as follows:  175 degrees flexion; 160 deg ABD; 90 deg ER at neutral and at 90 deg abd; 70  deg IR at 90 deg abd  Baseline:  see ROM tables above Goal status:  IN PROGRESS  2.  Patient will demonstrate 4+/5 R shoulder strength for ability to do haircare, self hygiene, reaching above head Baseline:  has 4-/5 R deltoid strength at 90 degress, but is exhibits compensatory shoulder shrugging and cannot lift the RUE throughout full available PROM  Goal Status:  IN PROGRESS  LONG TERM GOALS: Target date: 11/23/2024   Patient will be independent  with ongoing/advanced HEP for self-management at home.  Baseline: 10/17:  continuing to advance weekly Goal status: IN PROGRESS-  2.  Patient to improve R shoulder AROM to equal to the L shoulder without pain provocation to allow for increased ease of ADLs.  Baseline:  Refer to above UE ROM table Goal status: IN PROGRESS-  3.  Patient will demonstrate improved R shoulder strength to >/= 5/5 for functional UE use. Baseline: Refer to above UE MMT table Goal status: IN PROGRESS- 09/25/24  4.  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   Baseline: not allowed due to surgical restrictions Goal status: IN PROGRESS- 09/14/24 able to lift 1lb supine    PLAN:  PT FREQUENCY: 1-2x/week  PT DURATION: other: 16 weeks  PLANNED INTERVENTIONS: 97164- PT Re-evaluation, 97750- Physical Performance Testing, 97110-Therapeutic exercises, 97530- Therapeutic activity, [id number]- Neuromuscular re-education, 97535- Self Care, [id number]- Manual therapy, [id number]- Electrical stimulation (unattended), 97016- Vasopneumatic device, [id number]- Ultrasound, 79439 (1-2 muscles), 20561 (3+ muscles)- Dry Needling, Patient/Family education, Taping, Joint mobilization, Scar mobilization, Cryotherapy, and Moist heat  PLAN FOR NEXT SESSION:  12 weeks post op 10/01/24.  Antigravity strengthening of deltoid, add LT lift offs; add wall/ball circles with small ball, supine or seated angels  Sol LITTIE Gaskins, PTA 10/02/2024, 9:30 AM  Date of referral: 06/06/24 Referring provider: Duwaine Sharper Referring diagnosis? 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 (if different than referring diagnosis) R shoulder pain, R shoulder weakness, R shoulder stiffness  What was this (referring dx) caused by? Surgery (Type: R RTC repair, SAD/DCR)  [place] of Condition: Initial Onset (within last 3 months)   Laterality: Rt  Current Functional Measure Score: DASH Quick Dash = 34.1 / 100 = 34.1 %  Objective measurements identify impairments when they are compared to normal  values, the uninvolved extremity, and prior level of function.  [x]  Yes  []  No PROM R shoulder:  75 deg flexion; 60 deg ABD, 10 deg ER  Objective assessment of functional ability: Severe functional limitations   Briefly describe symptoms: New R RTC repair with SAD/DCR on 07/05/24; unable to use or move his R shoulder  How did symptoms start: surgery   Average pain intensity:  Last 24 hours: 3/10  Past week: 3/10  How often does the pt experience symptoms? Occasionally  How much have the symptoms interfered with usual daily activities? A little bit  How has condition changed since care began at this facility? Much better  In general, how is the patients overall health? Good   BACK PAIN (STarT Back Screening Tool) No

## 2024-10-03 ENCOUNTER — Ambulatory Visit: Admitting: Orthopaedic Surgery

## 2024-10-05 ENCOUNTER — Ambulatory Visit: Admitting: Rehabilitation

## 2024-10-05 ENCOUNTER — Encounter: Payer: Self-pay | Admitting: Rehabilitation

## 2024-10-05 DIAGNOSIS — M25611 Stiffness of right shoulder, not elsewhere classified: Secondary | ICD-10-CM

## 2024-10-05 DIAGNOSIS — M25511 Pain in right shoulder: Secondary | ICD-10-CM

## 2024-10-05 DIAGNOSIS — M6281 Muscle weakness (generalized): Secondary | ICD-10-CM

## 2024-10-05 NOTE — Therapy (Signed)
 OUTPATIENT PHYSICAL THERAPY SHOULDER TREATMENT    Progress Note Reporting Period 07/10/24 to 10/05/2024  See note below for Objective Data and Assessment of Progress/Goals.      Patient Name: Phillip Hughes MRN: [id number] DOB:12/24/1955, 68 y.o., male Today's Date: 10/05/2024   END OF SESSION:  PT End of Session - 10/05/24 0803     Visit Number 20    Date for Recertification  10/30/24    Authorization Type [employer] [facility]    Authorization Time Period 10/17-11/14    PT Start Time 0757    PT Stop Time 0850    PT Time Calculation (min) 53 min    Activity Tolerance Patient tolerated treatment well;No increased pain    Behavior During Therapy WFL for tasks assessed/performed                Past Medical History:  Diagnosis Date   Hypertension    Sciatica    Sinusitis    Past Surgical History:  Procedure Laterality Date   BACK SURGERY     HERNIA REPAIR     HIP SURGERY     NECK SURGERY     Patient Active Problem List   Diagnosis Date Noted   Hx of total hip arthroplasty, right 02/28/2023   Degenerative tear of medial meniscus of right knee 12/01/2022   Lumbar radiculopathy 11/29/2022   Peroneal mononeuropathy, 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    PCP: Dorina Loving, PA-C   REFERRING PROVIDER: Duwaine Ozell PARAS., MD   REFERRING DIAG: Tear of R rotator cuff, unspecified tear extent, unspecified whether traumatic (M75.101); 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); Subacromial bursitis R shoulder (M75.51); Chronic R shoulder pain (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  0/10 pain today   PAIN: Are you having pain? Yes: NPRS scale: 4/10 now and worst Pain location: R shoulder Pain description: soreness with overhead movement Aggravating factors: any movements Relieving factors: rest, ice, meds  PERTINENT HISTORY:  R THA, Back surgery 2017, lumbar radiculopathy, Neck surgery, HTN  PRECAUTIONS: Shoulder RTC protocol precautions for Dr Duwaine  RED FLAGS: None  HAND DOMINANCE: Left  WEIGHT BEARING RESTRICTIONS: Yes NWB RUE  FALLS:  Has patient fallen in last 6 months? 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       10/05/2024 Activities Rating  Open a tight or new jar.  3 = Moderate difficulty  Do heavy household chores (e.g., wash walls, floors). 4 = Severe difficulty  Carry a shopping bag or briefcase 2 = Mild difficulty  Wash your back. 4 = Severe difficulty  Use a knife to cut food. 2 = Mild difficulty  Recreational activities in which you take some force or impact through your arm, shoulder or hand (e.g., golf, hammering, tennis, etc.). 4 = Severe difficulty  During the past week, to what extent has your arm, shoulder or hand problem interfered  with your normal social activities with family, friends, neighbors or groups?  2 = Slightly  During the past week, were you limited in your work or other regular daily activities as a result of your arm, shoulder or hand problem? 2 = Slightly limited  Rate the severity of the following symptoms in the last week: Arm, Shoulder, or hand pain. 2 = Mild  Rate the severity of the following symptoms in the last week: Tingling (pins and needles) in your arm, shoulder or hand. 1 = none  During the past week, how much difficulty have you had sleeping because of the pain in your arm, shoulder or hand?  1 = No difficulty   (A QuickDASH score may not be calculated if there is greater than 1 missing item.)  Quick Dash Disability/Symptom Score: [(sum of 27 (n) responses/11 (n)] x 25 = 36.36  Minimally Clinically Important Difference (MCID): 15-20 points  (Franchignoni, F. et al. (2013). Minimally clinically important difference of the disabilities of the arm, shoulder, and hand outcome measures (DASH) and its shortened version (Quick DASH). Journal of Orthopaedic & Sports Physical Therapy, 44(1), 30-39)   COGNITION: Overall cognitive status: Within functional limits for tasks assessed     SENSATION: WFL  POSTURE: No Significant postural limitations  UPPER EXTREMITY ROM:   Passive ROM Right eval Left eval R 07/23/24 RUE 08/06/24 RUE 08/14/24 RUE 08/21/24 R 08/29/24  RUE 09/04/24 RUE 09/18/24 RUE 09/28/24 RUE 10/05/24  Shoulder flexion 75 180 90 120 130 133 143 149  154 163  Shoulder extension NT 55           Shoulder abduction  175  100 110 105 130 122  133 145  Shoulder adduction             Shoulder internal rotation Hand to stomach in sling 75   Hand to stomach;  30 degrees at 40 deg scaption At 45 deg scaption: 45 degrees 53 At 70 degrees ABD:  60-65 degrees Functional IR is L5 To  L4 with strap stretch 40 degrees at 90 deg ABD  Shoulder external rotation 10 80 10 32 37 At 45 degrees scaption:   44 degrees  At 90 deg ABD:  70 deg;  At neutral:  50 degrees 60 deg at neutral 65 deg at neutral; 75 at 90 deg ABD 65 deg at neutral;  88 deg at 90 ABD  Elbow flexion WNL = BUE            Elbow extension             Wrist flexion             Wrist extension             Wrist ulnar deviation             Wrist radial deviation             Wrist pronation             Wrist supination             (Blank rows = not tested)  UPPER EXTREMITY ROM:  Active ROM RUE AROM 09/21/24 AROM 09/25/24 RUE 09/28/24 RUE 10/05/24  Shoulder flexion 110 w/ UT substitution 112 with shrug 115 with shrug/ 120 deg with shrug at the end range  Shoulder extension 40   50   Shoulder abduction 85 w/ UT substitution 88 with shrug 85 deg with shrug 90 with shrug end ROM  Shoulder adduction      Shoulder internal rotation To upper hip To C6 SI joint To L4;  30 deg At 90 ABD  Shoulder external rotation 45 deg at neutral To lumbar spine parallel to iliac crest 49 deg at 0 deg Abd;  50 deg on table at 90 deg ABD 45 deg at neutral; 80 deg at 90 ABD  Elbow flexion      Elbow extension      Wrist flexion      Wrist extension      Wrist ulnar deviation      Wrist radial deviation      Wrist pronation      Wrist supination       (Blank rows = not tested)   UPPER EXTREMITY MMT: 07/10/24--No strength testing done at eval due to surgical precautions  MMT Right 10/14 Left eval RUE 09/28/24  Shoulder flexion 4  4  Shoulder extension     Shoulder abduction 4  4-  Shoulder adduction     Shoulder internal rotation 4+  4+  Shoulder external rotation 4+  4  Middle trapezius     Lower trapezius     Elbow flexion     Elbow extension     Wrist flexion     Wrist extension     Wrist ulnar deviation     Wrist radial deviation     Wrist pronation     Wrist supination     Grip strength (lbs)     (Blank rows = not tested)  SHOULDER SPECIAL TESTS:  N/A   JOINT MOBILITY TESTING:  N/A  PALPATION:  TTP over the  anterior lateral deltoid area and upper traps    TODAY'S TREATMENT:  10/05/24 THERAPEUTIC EXERCISE: UBE L2.5  x 3'F/3'B  THERAPEUTIC ACTIVITIES: To improve functional performance.  Demonstration, verbal and tactile cues throughout for technique. Standing row blue TB doubled x 30 RUE Standing shoulder ext RTB doubled x 30 RUE Standing ER YTB x 20 RUE Standing IR GTB x 20  RUE Wall dust to full flexion x 20 RUE Wall ball roll up flexion x 20 Bue Finger ladder flexion x 10 RUE SL ER 1# x 20 RUE SL HABD 1# x 20 RUE SL ABD 1# x 20 RUE Supine shoulder flexion stretch x 1'  RUE Supine shoulder ER stretch at neutral x 1' RUE  NEUROMUSCULAR RE-EDUCATION: To improve coordination, kinesthesia, and posture. Seated cane flexion w/ PT facilitation for scapular position/rotation/avoiding UT compensation, keeping grip close doing true flexion vs  scaption Wall ball circles at 90 deg x 20 CW; x 20 CCW w/ PT manual facilitation for scapular positioning, keeping elbow straight Supine snow angel ABD x 20 BUE  10/02/24 UBE L2.5 109F/3B Standing R shld ER stretch in doorway x 1 min Wall slide R shld flexion x 10; scaption x 10 Standing press 1lb bar x 10  Sidelying R shoulder flexion x 10 no weight; 1lb x 10 Sidelying R shoulder horiz ABD x 10; 1lb x 10 Sidelying R shld ER 1lb 2x10 towel at side Prone R rows 4lb 2x10 Prone R shld ext 3lb 2x10  PROM to R shoulder flex, abd, ER   PATIENT EDUCATION:  Education details: medbridge updated with new HEP Person educated: Patient Education method: Explanation, Demonstration, Verbal cues, Tactile cues, and Handouts Education comprehension: verbalized understanding, verbal cues required, tactile cues required, and needs further education  HOME EXERCISE PROGRAM: Access Code: [email or web address] URL: [email or web address] Date: 10/05/2024 Prepared by: Garnette Montclair  Exercises - Seated Shoulder Scaption AAROM with Pulley at Side  - 1 x daily -  7 x weekly - 3 sets - 10 reps - Sidelying Shoulder External Rotation with Dumbbell  - 1 x daily - 7 x weekly - 3 sets - 10 reps - Sidelying Shoulder Horizontal Abduction  - 1 x daily - 7 x weekly - 3 sets - 10 reps - Supine Shoulder External Rotation in Abduction  - 1 x daily - 7 x weekly - 3 sets - 10 reps - Seated Shoulder Abduction Towel Slide at Table Top  - 1 x daily - 7 x weekly - 1 sets - 2 reps - 1 min hold - Doorway Pec Stretch at 90 Degrees Abduction  - 1 x daily - 7 x weekly - 1 sets - 2 reps - 1 min hold - Standing Shoulder Flexion to 90 Degrees  - 1 x daily - 7 x weekly - 2 sets - 10 reps - Shoulder Abduction - Thumbs Up  - 1 x daily - 7 x weekly - 2 sets - 10 reps - Standing Shoulder Press  - 1 x daily - 7 x weekly - 2 sets - 10 reps - Standing Shoulder Row with Anchored Resistance  - 1 x daily - 7 x weekly - 3 sets - 10 reps - Shoulder extension with resistance - Neutral  - 1 x daily - 7 x weekly - 3 sets - 10 reps - Shoulder External Rotation with Anchored Resistance  - 1 x daily - 7 x weekly - 3 sets - 10 reps - Shoulder Internal Rotation with Resistance  - 1 x daily - 7 x weekly - 3 sets - 10 reps - Wall Push Up with Plus  - 1 x daily - 7 x weekly - 3 sets - 10 reps - Standing Shoulder External Rotation Stretch in Doorway  - 1 x daily - 7 x weekly - 1 sets - 2 reps - 1 min hold ASSESSMENT:  CLINICAL IMPRESSION: Patient has  been seen x 12 weeks PT S/P R RCR by Dr Duwaine.   He has made good progress to date, but has not fully met the goals that we have set for him.   However, he has excellent rehab potential to meet his goals.   He has only been doing AROM of the R shoulder for 6 weeks now so he has a long way to go with strengthening.    He has pretty good R shoulder PROM now (see ROM tables above for progress).   However, he cannot lift his R shoulder antigravity throughout the full available ROM that he has indicating strength deficits.   He still exhibits upper trap substitution  pattern and today we noticed some scapular winging with shoulder flexion.   He needs to improve his strength in shoulder elevated positions to be able to return to playing golf which is a very reasonable goal for him.   He is no longer having any pain in the R shoulder, but only soreness after exercise.    He is progressing very well for 12 weeks after surgery.  He needs at least 1 more month of PT for full AROM and sufficient strength to return to functional activities.   Continue per POC  OBJECTIVE IMPAIRMENTS: decreased ROM, decreased strength, decreased safety awareness, increased edema, increased muscle spasms, impaired flexibility, impaired UE functional use, and pain.   ACTIVITY LIMITATIONS: carrying, lifting, sleeping, bathing, toileting, dressing, self feeding, reach over head, and hygiene/grooming  PARTICIPATION LIMITATIONS: meal prep, cleaning, laundry, driving, and shopping  PERSONAL FACTORS: Age and 1-2 comorbidities: HTN, sciatica, lumbar radiculopathy and chronic back pain are also affecting patient's functional outcome.   REHAB POTENTIAL: Good  CLINICAL DECISION MAKING: Evolving/moderate complexity  EVALUATION COMPLEXITY: Moderate   GOALS: Goals reviewed with patient? Yes  SHORT TERM GOALS: Target date: 10/26/2024   Patient will have R shoulder ROM as follows:  175 degrees flexion; 160 deg ABD; 90 deg ER at neutral and at 90 deg abd; 70  deg IR at 90 deg abd  Baseline:  see ROM tables above Goal status:  IN PROGRESS  2.  Patient will demonstrate 4+/5 R shoulder strength for ability to do haircare, self hygiene, reaching above head Baseline:  has 4-/5 R deltoid strength at 90 degress, but is exhibits compensatory shoulder shrugging and cannot lift the RUE throughout full available PROM  Goal Status:  IN PROGRESS  LONG TERM GOALS: Target date: 11/23/2024   Patient will be independent with ongoing/advanced HEP for self-management at home.  Baseline: 10/17:  continuing  to advance weekly 10/24:  still changing every week Goal status: IN PROGRESS-  2.  Patient to improve R shoulder AROM to equal to the L shoulder without pain provocation to allow for increased ease of ADLs.  Baseline: Refer to above UE ROM table 10/05/24:  see updated ROM tables above for PROM and AROM of the R shoulder Goal status: IN PROGRESS-  3.  Patient will demonstrate improved R shoulder strength to >/= 5/5 for functional UE use. Baseline: Refer to above UE MMT table Goal status: IN PROGRESS- 09/25/24  4.  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   Baseline: not allowed due to surgical restrictions 10/04/24:  can lift a 1 lb dumbbell overhead to 115 degrees of R shoulder flexion with R upper trap shrugging/compensatory pattern Goal status: IN PROGRESS    5.  Patient will score </=  20 on Quick Dash outcome survey for improved R shoulder function and QOL  Baseline:  10/05/24 = 36.36  Goal Status:  IN PROGRESS  PLAN:  PT FREQUENCY: 1-2x/week  PT DURATION: other: 16 weeks  PLANNED INTERVENTIONS: 97164- PT Re-evaluation, 97750- Physical Performance Testing, 97110-Therapeutic exercises, 97530- Therapeutic activity, [id number]- Neuromuscular re-education, 97535- Self Care, 02859- Manual therapy, [id number]- Electrical stimulation (unattended), 97016- Vasopneumatic device, [id number]- Ultrasound, 79439 (1-2 muscles), 20561 (3+ muscles)- Dry Needling, Patient/Family education, Taping, Joint mobilization, Scar mobilization, Cryotherapy, and Moist heat  PLAN FOR NEXT SESSION:  12 weeks post op 10/01/24.  Antigravity strengthening of deltoid/supraspinatus; work on serratus strength by adding chest press supine w/ weight or standing with TB and pushups w/ plus  Lismary Kiehn, PT 10/05/2024, 10:32 AM  Date of referral: 06/06/24 Referring provider: Duwaine Sharper Referring diagnosis? 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 (if different than referring diagnosis) R shoulder pain, R shoulder weakness, R shoulder stiffness  What was this (referring dx) caused by? Surgery (Type: R RTC repair, SAD/DCR)  Lysle of Condition: Initial Onset (within last 3 months)   Laterality: Rt  Current Functional Measure Score: DASH Quick Dash = 34.1 / 100 = 34.1 %  Objective measurements identify impairments when they are compared to normal values, the uninvolved extremity, and prior level of function.  [x]  Yes  []  No PROM R shoulder:  75 deg flexion; 60 deg ABD, 10 deg ER  Objective assessment of functional ability: Severe functional limitations   Briefly describe symptoms: New R RTC repair with SAD/DCR on 07/05/24; unable to use or move his R shoulder  How did symptoms start: surgery   Average pain intensity:  Last 24 hours: 3/10  Past week: 3/10  How often does the pt experience symptoms? Occasionally  How much have the symptoms interfered with usual daily activities? A little bit  How has condition changed since care began at this facility? Much better  In general, how is the patients overall health? Good   BACK PAIN (STarT Back Screening Tool) No

## 2024-10-09 ENCOUNTER — Ambulatory Visit

## 2024-10-09 DIAGNOSIS — M6281 Muscle weakness (generalized): Secondary | ICD-10-CM

## 2024-10-09 DIAGNOSIS — M25511 Pain in right shoulder: Secondary | ICD-10-CM | POA: Diagnosis not present

## 2024-10-09 DIAGNOSIS — M25611 Stiffness of right shoulder, not elsewhere classified: Secondary | ICD-10-CM

## 2024-10-09 NOTE — Therapy (Signed)
 OUTPATIENT PHYSICAL THERAPY SHOULDER TREATMENT        Patient Name: Phillip Hughes MRN: [id number] DOB:Mar 26, 1956, 68 y.o., male Today's Date: 10/09/2024   END OF SESSION:  PT End of Session - 10/09/24 0816     Visit Number 21    Date for Recertification  10/30/24    Authorization Type [employer] [facility]    Authorization Time Period 10/17-11/14    Authorization - Visit Number 4    PT Start Time 0805    PT Stop Time 0846    PT Time Calculation (min) 41 min    Activity Tolerance Patient tolerated treatment well;No increased pain    Behavior During Therapy WFL for tasks assessed/performed                 Past Medical History:  Diagnosis Date   Hypertension    Sciatica    Sinusitis    Past Surgical History:  Procedure Laterality Date   BACK SURGERY     HERNIA REPAIR     HIP SURGERY     NECK SURGERY     Patient Active Problem List   Diagnosis Date Noted   Hx of total hip arthroplasty, right 02/28/2023   Degenerative tear of medial meniscus of right knee 12/01/2022   Lumbar radiculopathy 11/29/2022   Peroneal mononeuropathy, 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    PCP: Dorina Loving, PA-C   REFERRING PROVIDER: Duwaine Ozell PARAS., MD   REFERRING DIAG: Tear of R rotator cuff, unspecified tear extent, unspecified whether traumatic (M75.101); 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); Subacromial bursitis R shoulder (M75.51); Chronic R shoulder pain (M25.511, G89.29)  THERAPY DIAG:  Acute pain of right shoulder  Muscle weakness (generalized)  Stiffness of right shoulder, not elsewhere classified  RATIONALE FOR EVALUATION AND TREATMENT: Rehabilitation  ONSET DATE: 07/05/54  NEXT MD VISIT: 10/16/24   SUBJECTIVE:                                                                                                                                                                                                         SUBJECTIVE STATEMENT: Pt denies pain today, feels easier to raise arm but feels like  he is still shrugging.   PAIN: Are you having pain? Yes: NPRS scale: 0/10 now and worst Pain location: R shoulder Pain description: soreness with overhead movement Aggravating factors: any movements Relieving factors: rest, ice, meds  PERTINENT HISTORY:  R THA, Back surgery 2017, lumbar radiculopathy, Neck surgery, HTN  PRECAUTIONS: Shoulder RTC protocol precautions for Dr Duwaine  RED FLAGS: None  HAND DOMINANCE: Left  WEIGHT  BEARING RESTRICTIONS: Yes NWB RUE  FALLS:  Has patient fallen in last 6 months? 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       10/09/2024 Activities Rating  Open a tight or new jar.  3 = Moderate difficulty  Do heavy household chores (e.g., wash walls, floors). 4 = Severe difficulty  Carry a shopping bag or briefcase 2 = Mild difficulty  Wash your back. 4 = Severe difficulty  Use a knife to cut food. 2 = Mild difficulty  Recreational activities in which you take some force or impact through your arm, shoulder or hand (e.g., golf, hammering, tennis, etc.). 4 = Severe difficulty  During the past week, to what extent has your arm, shoulder or hand problem interfered with your normal social activities with family, friends, neighbors or groups?  2 = Slightly  During the past week, were  you limited in your work or other regular daily activities as a result of your arm, shoulder or hand problem? 2 = Slightly limited  Rate the severity of the following symptoms in the last week: Arm, Shoulder, or hand pain. 2 = Mild  Rate the severity of the following symptoms in the last week: Tingling (pins and needles) in your arm, shoulder or hand. 1 = none  During the past week, how much difficulty have you had sleeping because of the pain in your arm, shoulder or hand?  1 = No difficulty   (A QuickDASH score may not be calculated if there is greater than 1 missing item.)  Quick Dash Disability/Symptom Score: [(sum of 27 (n) responses/11 (n)] x 25 = 36.36  Minimally Clinically Important Difference (MCID): 15-20 points  (Franchignoni, F. et al. (2013). Minimally clinically important difference of the disabilities of the arm, shoulder, and hand outcome measures (DASH) and its shortened version (Quick DASH). Journal of Orthopaedic & Sports Physical Therapy, 44(1), 30-39)   COGNITION: Overall cognitive status: Within functional limits for tasks assessed     SENSATION: WFL  POSTURE: No Significant postural limitations  UPPER EXTREMITY ROM:   Passive ROM Right eval Left eval R 07/23/24 RUE 08/06/24 RUE 08/14/24 RUE 08/21/24 R 08/29/24  RUE 09/04/24 RUE 09/18/24 RUE 09/28/24 RUE 10/05/24  Shoulder flexion 75 180 90 120 130 133 143 149  154 163  Shoulder extension NT 55           Shoulder abduction  175  100 110 105 130 122  133 145  Shoulder adduction             Shoulder internal rotation Hand to stomach in sling 75   Hand to stomach;  30 degrees at 40 deg scaption At 45 deg scaption: 45 degrees 53 At 70 degrees ABD:  60-65 degrees Functional IR is L5 To L4 with strap stretch 40 degrees at 90 deg ABD  Shoulder external rotation 10 80 10 32  37 At 45 degrees scaption:  44 degrees  At 90 deg ABD:  70 deg;  At neutral:  50 degrees 60 deg at neutral 65 deg at neutral; 75 at 90 deg ABD 65  deg at neutral;  88 deg at 90 ABD  Elbow flexion WNL = BUE            Elbow extension             Wrist flexion             Wrist extension             Wrist ulnar deviation             Wrist radial deviation             Wrist pronation             Wrist supination             (Blank rows = not tested)  UPPER EXTREMITY ROM:  Active ROM RUE AROM 09/21/24 AROM 09/25/24 RUE 09/28/24 RUE 10/05/24  Shoulder flexion 110 w/ UT substitution 112 with shrug 115 with shrug/ 120 deg with shrug at the end range  Shoulder extension 40   50   Shoulder abduction 85 w/ UT substitution 88 with shrug 85 deg with shrug 90 with shrug end ROM  Shoulder adduction      Shoulder internal rotation To upper hip To C6 SI joint To L4;  30 deg At 90 ABD  Shoulder external rotation 45 deg at neutral To lumbar spine parallel to iliac crest 49 deg at 0 deg Abd;  50 deg on table at 90 deg ABD 45 deg at neutral; 80 deg at 90 ABD  Elbow flexion      Elbow extension      Wrist flexion      Wrist extension      Wrist ulnar deviation      Wrist radial deviation      Wrist pronation      Wrist supination       (Blank rows = not tested)   UPPER EXTREMITY MMT: 07/10/24--No strength testing done at eval due to surgical precautions  MMT Right 10/14 Left eval RUE 09/28/24  Shoulder flexion 4  4  Shoulder extension     Shoulder abduction 4  4-  Shoulder adduction     Shoulder internal rotation 4+  4+  Shoulder external rotation 4+  4  Middle trapezius     Lower trapezius     Elbow flexion     Elbow extension     Wrist flexion     Wrist extension     Wrist ulnar deviation     Wrist radial deviation     Wrist pronation     Wrist supination     Grip strength (lbs)     (Blank rows = not tested)  SHOULDER SPECIAL TESTS:  N/A   JOINT MOBILITY TESTING:  N/A  PALPATION:  TTP over the anterior lateral deltoid area and upper traps    TODAY'S TREATMENT:  10/09/24 THERAPEUTIC EXERCISE: Pulleys flexion x  UBE L3.0  x 2'F/2'B Supine shoulder flex w/ 1lb bar 3x for 10 sec holds Standing IR RTB arm at side x 20 Standing ER RTB arm at side x 12 S/l ER 1lb x 20  THERAPEUTIC ACTIVITIES: To improve functional performance.  Demonstration, verbal and tactile cues throughout for technique. Finger ladder flexion all the way up x 10  with ecc lowering Finger ladder scaption all the way up x 10 with ecc lowering Wall CW/CCW circles 30 sec intervals; flexion then scaption Standing golf swings with blue TB x 20 Standing shoulder flexion YTB to 90 deg x 10; scaption x 10      10/05/24 THERAPEUTIC EXERCISE: UBE L2.5  x 3'F/3'B  THERAPEUTIC ACTIVITIES: To improve functional performance.  Demonstration, verbal and tactile cues throughout for technique. Standing row blue TB doubled x 30 RUE Standing shoulder ext RTB doubled x 30 RUE Standing ER YTB x 20 RUE Standing IR GTB x 20 RUE Wall dust to full flexion x 20 RUE Wall ball roll up flexion x 20 Bue Finger ladder flexion x 10 RUE SL ER 1# x 20 RUE SL HABD 1# x 20 RUE SL ABD 1# x 20 RUE Supine shoulder flexion stretch x 1'  RUE Supine shoulder ER stretch at neutral x 1' RUE  NEUROMUSCULAR RE-EDUCATION: To improve coordination, kinesthesia, and posture. Seated cane flexion w/ PT facilitation for scapular position/rotation/avoiding UT compensation, keeping grip close doing true flexion vs  scaption Wall ball circles at 90 deg x 20 CW; x 20 CCW w/ PT manual facilitation for scapular positioning, keeping elbow straight Supine snow angel ABD x 20 BUE  10/02/24 UBE L2.5 41F/3B Standing R shld ER stretch in doorway x 1 min Wall slide R shld flexion x 10; scaption x 10 Standing press 1lb bar x 10  Sidelying R shoulder flexion x 10 no weight; 1lb x 10 Sidelying R shoulder horiz ABD x 10; 1lb x 10 Sidelying R shld ER 1lb 2x10 towel at side Prone R rows 4lb 2x10 Prone R shld ext 3lb 2x10  PROM to R shoulder flex, abd, ER   PATIENT  EDUCATION:  Education details: medbridge updated with new HEP Person educated: Patient Education method: Explanation, Demonstration, Verbal cues, Tactile cues, and Handouts Education comprehension: verbalized understanding, verbal cues required, tactile cues required, and needs further education  HOME EXERCISE PROGRAM: Access Code: [email or web address] URL: [email or web address] Date: 10/05/2024 Prepared by: Garnette Montclair  Exercises - Seated Shoulder Scaption AAROM with Pulley at Side  - 1 x daily - 7 x weekly - 3 sets - 10 reps - Sidelying Shoulder External Rotation with Dumbbell  - 1 x daily - 7 x weekly - 3 sets - 10 reps - Sidelying Shoulder Horizontal Abduction  - 1 x daily - 7 x weekly - 3 sets - 10 reps - Supine Shoulder External Rotation in Abduction  - 1 x daily - 7 x weekly - 3 sets - 10 reps - Seated Shoulder Abduction Towel Slide at Table Top  - 1 x daily - 7 x weekly - 1 sets - 2 reps - 1 min hold - Doorway Pec Stretch at 90 Degrees Abduction  - 1 x daily - 7 x weekly - 1 sets - 2 reps - 1 min hold - Standing Shoulder Flexion to 90 Degrees  - 1 x daily - 7 x weekly - 2 sets - 10 reps - Shoulder Abduction - Thumbs Up  - 1 x daily - 7 x weekly - 2 sets - 10 reps - Standing Shoulder Press  - 1 x daily - 7 x weekly - 2 sets - 10 reps - Standing Shoulder Row with Anchored Resistance  - 1 x daily - 7 x weekly - 3 sets - 10 reps - Shoulder extension with resistance - Neutral  - 1 x daily - 7 x weekly - 3 sets -  10 reps - Shoulder External Rotation with Anchored Resistance  - 1 x daily - 7 x weekly - 3 sets - 10 reps - Shoulder Internal Rotation with Resistance  - 1 x daily - 7 x weekly - 3 sets - 10 reps - Wall Push Up with Plus  - 1 x daily - 7 x weekly - 3 sets - 10 reps - Standing Shoulder External Rotation Stretch in Doorway  - 1 x daily - 7 x weekly - 1 sets - 2 reps - 1 min hold ASSESSMENT:  CLINICAL IMPRESSION: Patient now  13 weeks out from S/P R RCR by Dr Duwaine. No  issues completing interventions. Progressed RTC strengthening along with functional reaching activities for R shoulder. Minimal shrugging noticed today with overhead movements.   OBJECTIVE IMPAIRMENTS: decreased ROM, decreased strength, decreased safety awareness, increased edema, increased muscle spasms, impaired flexibility, impaired UE functional use, and pain.   ACTIVITY LIMITATIONS: carrying, lifting, sleeping, bathing, toileting, dressing, self feeding, reach over head, and hygiene/grooming  PARTICIPATION LIMITATIONS: meal prep, cleaning, laundry, driving, and shopping  PERSONAL FACTORS: Age and 1-2 comorbidities: HTN, sciatica, lumbar radiculopathy and chronic back pain are also affecting patient's functional outcome.   REHAB POTENTIAL: Good  CLINICAL DECISION MAKING: Evolving/moderate complexity  EVALUATION COMPLEXITY: Moderate   GOALS: Goals reviewed with patient? Yes  SHORT TERM GOALS: Target date: 10/26/2024   Patient will have R shoulder ROM as follows:  175 degrees flexion; 160 deg ABD; 90 deg ER at neutral and at 90 deg abd; 70  deg IR at 90 deg abd  Baseline:  see ROM tables above Goal status:  IN PROGRESS  2.  Patient will demonstrate 4+/5 R shoulder strength for ability to do haircare, self hygiene, reaching above head Baseline:  has 4-/5 R deltoid strength at 90 degress, but is exhibits compensatory shoulder shrugging and cannot lift the RUE throughout full available PROM  Goal Status:  IN PROGRESS  LONG TERM GOALS: Target date: 11/23/2024   Patient will be independent with ongoing/advanced HEP for self-management at home.  Baseline: 10/17:  continuing to advance weekly 10/24:  still changing every week Goal status: IN PROGRESS-  2.  Patient to improve R shoulder AROM to equal to the L shoulder without pain provocation to allow for increased ease of ADLs.  Baseline: Refer to above UE ROM table 10/05/24:  see updated ROM tables above for PROM and AROM of the  R shoulder Goal status: IN PROGRESS-  3.  Patient will demonstrate improved R shoulder strength to >/= 5/5 for functional UE use. Baseline: Refer to above UE MMT table Goal status: IN PROGRESS- 09/25/24  4.  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   Baseline: not allowed due to surgical restrictions 10/04/24:  can lift a 1 lb dumbbell overhead to 115 degrees of R shoulder flexion with R upper trap shrugging/compensatory pattern Goal status: IN PROGRESS    5.  Patient will score </= 20 on Quick Dash outcome survey for improved R shoulder function and QOL  Baseline:  10/05/24 = 36.36  Goal Status:  IN PROGRESS  PLAN:  PT FREQUENCY: 1-2x/week  PT DURATION: other: 16 weeks  PLANNED INTERVENTIONS: 97164- PT Re-evaluation, 97750- Physical Performance Testing, 97110-Therapeutic exercises, 97530- Therapeutic activity, [id number]- Neuromuscular re-education, 97535- Self Care, 02859- Manual therapy, [id number]- Electrical stimulation (unattended), 97016- Vasopneumatic device, [id number]- Ultrasound, 79439 (1-2 muscles), 20561 (3+ muscles)- Dry Needling, Patient/Family education,  Taping, Joint mobilization, Scar mobilization, Cryotherapy, and Moist heat  PLAN FOR NEXT SESSION:  13 weeks post op 10/08/24.  Antigravity strengthening of deltoid/supraspinatus; work on serratus strength by adding chest press supine w/ weight or standing with TB and pushups w/ plus  Myna Freimark L Azelea Seguin, PTA 10/09/2024, 8:53 AM  Date of referral: 06/06/24 Referring provider: Duwaine Sharper Referring diagnosis? 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 (if different than referring diagnosis) R shoulder pain, R shoulder weakness, R shoulder stiffness  What was this (referring dx) caused  by? Surgery (Type: R RTC repair, SAD/DCR)  Lysle of Condition: Initial Onset (within last 3 months)   Laterality: Rt  Current Functional Measure Score: DASH Quick Dash = 34.1 / 100 = 34.1 %  Objective measurements identify impairments when they are compared to normal values, the uninvolved extremity, and prior level of function.  [x]  Yes  []  No PROM R shoulder:  75 deg flexion; 60 deg ABD, 10 deg ER  Objective assessment of functional ability: Severe functional limitations   Briefly describe symptoms: New R RTC repair with SAD/DCR on 07/05/24; unable to use or move his R shoulder  How did symptoms start: surgery   Average pain intensity:  Last 24 hours: 3/10  Past week: 3/10  How often does the pt experience symptoms? Occasionally  How much have the symptoms interfered with usual daily activities? A little bit  How has condition changed since care began at this facility? Much better  In general, how is the patients overall health? Good   BACK PAIN (STarT Back Screening Tool) No

## 2024-10-12 ENCOUNTER — Ambulatory Visit: Admitting: Rehabilitation

## 2024-10-12 ENCOUNTER — Encounter: Payer: Self-pay | Admitting: Rehabilitation

## 2024-10-12 DIAGNOSIS — M25511 Pain in right shoulder: Secondary | ICD-10-CM

## 2024-10-12 DIAGNOSIS — M25611 Stiffness of right shoulder, not elsewhere classified: Secondary | ICD-10-CM

## 2024-10-12 DIAGNOSIS — M6281 Muscle weakness (generalized): Secondary | ICD-10-CM

## 2024-10-12 NOTE — Therapy (Signed)
 OUTPATIENT PHYSICAL THERAPY SHOULDER TREATMENT        Patient Name: Phillip Hughes MRN: [id number] DOB:[birth year], 68 y.o., male Today's Date: 10/14/2024   END OF SESSION:           Past Medical History:  Diagnosis Date   Hypertension    Sciatica    Sinusitis    Past Surgical History:  Procedure Laterality Date   BACK SURGERY     HERNIA REPAIR     HIP SURGERY     NECK SURGERY     Patient Active Problem List   Diagnosis Date Noted   Hx of total hip arthroplasty, right 02/28/2023   Degenerative tear of medial meniscus of right knee 12/01/2022   Lumbar radiculopathy 11/29/2022   Peroneal mononeuropathy, 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    PCP: Dorina Loving, PA-C   REFERRING PROVIDER: Duwaine Ozell PARAS., MD   REFERRING DIAG: Tear of R rotator cuff, unspecified tear extent, unspecified whether traumatic (M75.101); 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  Reports still having trouble getting his R shoulder elevated past 90-100 degrees without shrugging compensation  PAIN: Are you having pain? Yes: NPRS scale: 0/10 now and worst Pain location: R shoulder Pain description: soreness with overhead movement Aggravating factors: any movements Relieving factors: rest, ice, meds  PERTINENT HISTORY:  R THA, Back surgery 2017, lumbar radiculopathy, Neck surgery, HTN  PRECAUTIONS: Shoulder RTC  protocol precautions for Dr Duwaine  RED FLAGS: None  HAND DOMINANCE: Left  WEIGHT BEARING RESTRICTIONS: Yes NWB RUE  FALLS:  Has patient fallen in last 6 months? 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       10/14/2024 Activities Rating  Open a tight or new jar.  3 = Moderate difficulty  Do heavy household chores (e.g., wash walls, floors). 4 = Severe difficulty  Carry a shopping bag or briefcase 2 = Mild difficulty  Wash your back. 4 = Severe difficulty  Use a knife to cut food. 2 = Mild difficulty  Recreational activities in which you take some force or impact through your arm, shoulder or hand (e.g., golf, hammering, tennis, etc.). 4 = Severe difficulty  During the past week, to what extent has your arm, shoulder or hand problem interfered with your normal social activities with family, friends, neighbors or groups?  2 = Slightly  During the past week, were you limited in your work or other regular daily activities as a result of your arm, shoulder or hand problem? 2 = Slightly limited  Rate the severity of the following symptoms in the last week: Arm, Shoulder, or hand pain. 2 = Mild  Rate the severity of the following symptoms in the last week: Tingling (pins and needles) in your arm, shoulder or hand. 1 =  none  During the past week, how much difficulty have you had sleeping because of the pain in your arm, shoulder or hand?  1 = No difficulty   (A QuickDASH score may not be calculated if there is greater than 1 missing item.)  Quick Dash Disability/Symptom Score: [(sum of 27 (n) responses/11 (n)] x 25 = 36.36  Minimally Clinically Important Difference (MCID): 15-20 points  (Franchignoni, F. et al. (2013). Minimally clinically important difference of the disabilities of the arm, shoulder, and hand outcome measures (DASH) and its shortened version (Quick DASH). Journal of Orthopaedic & Sports Physical Therapy, 44(1), 30-39)   COGNITION: Overall cognitive status: Within functional limits for tasks assessed     SENSATION: WFL  POSTURE: No Significant postural limitations  UPPER EXTREMITY ROM:   Passive ROM Right eval Left eval R 07/23/24 RUE 08/06/24 RUE 08/14/24 RUE 08/21/24 R 08/29/24  RUE 09/04/24 RUE 09/18/24 RUE 09/28/24 RUE 10/05/24  Shoulder flexion 75 180 90 120 130 133 143 149  154 163  Shoulder extension NT 55           Shoulder abduction  175  100 110 105 130 122  133 145  Shoulder adduction             Shoulder internal rotation Hand to stomach in sling 75   Hand to stomach;  30 degrees at 40 deg scaption At 45 deg scaption: 45 degrees 53 At 70 degrees ABD:  60-65 degrees Functional IR is L5 To L4 with strap stretch 40 degrees at 90 deg ABD  Shoulder external rotation 10 80 10 32 37 At 45 degrees scaption:  44 degrees  At 90 deg ABD:  70 deg;  At neutral:  50 degrees 60 deg at neutral 65 deg at neutral; 75 at 90 deg ABD 65 deg at neutral;  88 deg at 90 ABD  Elbow flexion WNL = BUE            Elbow extension             Wrist flexion  Wrist extension             Wrist ulnar deviation             Wrist radial deviation             Wrist pronation             Wrist supination             (Blank rows = not tested)  UPPER EXTREMITY ROM:  Active ROM RUE  AROM 09/21/24 AROM 09/25/24 RUE 09/28/24 RUE 10/05/24  Shoulder flexion 110 w/ UT substitution 112 with shrug 115 with shrug/ 120 deg with shrug at the end range  Shoulder extension 40   50   Shoulder abduction 85 w/ UT substitution 88 with shrug 85 deg with shrug 90 with shrug end ROM  Shoulder adduction      Shoulder internal rotation To upper hip To C6 SI joint To L4;  30 deg At 90 ABD  Shoulder external rotation 45 deg at neutral To lumbar spine parallel to iliac crest 49 deg at 0 deg Abd;  50 deg on table at 90 deg ABD 45 deg at neutral; 80 deg at 90 ABD  Elbow flexion      Elbow extension      Wrist flexion      Wrist extension      Wrist ulnar deviation      Wrist radial deviation      Wrist pronation      Wrist supination       (Blank rows = not tested)   UPPER EXTREMITY MMT: 07/10/24--No strength testing done at eval due to surgical precautions  MMT Right 10/14 Left eval RUE 09/28/24  Shoulder flexion 4  4  Shoulder extension     Shoulder abduction 4  4-  Shoulder adduction     Shoulder internal rotation 4+  4+  Shoulder external rotation 4+  4  Middle trapezius     Lower trapezius     Elbow flexion     Elbow extension     Wrist flexion     Wrist extension     Wrist ulnar deviation     Wrist radial deviation     Wrist pronation     Wrist supination     Grip strength (lbs)     (Blank rows = not tested)  SHOULDER SPECIAL TESTS:  N/A   JOINT MOBILITY TESTING:  N/A  PALPATION:  TTP over the anterior lateral deltoid area and upper traps    TODAY'S TREATMENT:  10/12/24 THERAPEUTIC EXERCISE: To improve strength and endurance.  Demonstration, verbal and tactile cues throughout for technique. UBE L2.5 x 3'F/3'B  NEUROMUSCULAR RE-EDUCATION: To improve coordination, kinesthesia, posture, and proprioception. Supine shoulder ABD w/ PT facilitation for scapular depression while patient isometrically holds depression x 3' of repetitions Supine shoulder ABD w/  PT Holding lateral scapular border to prevent upward rotation to isolate GH motion and inferior joint stretch x 1' x 2 Supine serratus punches 4# x 30 RUE  Table pushups x 10 BUE Doorway at 90/90 lift off scapular retraction x 30 BUE Wall slides with HABD YTB for serratus activation x 30 BUE   THERAPEUTIC ACTIVITIES: To improve functional performance.  Demonstration, verbal and tactile cues throughout for technique. SL ER 2# x 30 RUE SL HABD 2# x 30 RUE Standing rowing doubled blue TB x 30 RUE Standing chest press YTB x 30 RUE Standing ER YTB x 30 RUE Standing  IR GTB x 30 RUE Holding Top of doorframe flexion stretch x 1' BUE   10/09/24 THERAPEUTIC EXERCISE: Pulleys flexion x UBE L3.0  x 2'F/2'B Supine shoulder flex w/ 1lb bar 3x for 10 sec holds Standing IR RTB arm at side x 20 Standing ER RTB arm at side x 12 S/l ER 1lb x 20  THERAPEUTIC ACTIVITIES: To improve functional performance.  Demonstration, verbal and tactile cues throughout for technique. Finger ladder flexion all the way up x 10 with ecc lowering Finger ladder scaption all the way up x 10 with ecc lowering Wall CW/CCW circles 30 sec intervals; flexion then scaption Standing golf swings with blue TB x 20 Standing shoulder flexion YTB to 90 deg x 10; scaption x 10       10/05/24 THERAPEUTIC EXERCISE: UBE L2.5  x 3'F/3'B  THERAPEUTIC ACTIVITIES: To improve functional performance.  Demonstration, verbal and tactile cues throughout for technique. Standing row blue TB doubled x 30 RUE Standing shoulder ext RTB doubled x 30 RUE Standing ER YTB x 20 RUE Standing IR GTB x 20 RUE Wall dust to full flexion x 20 RUE Wall ball roll up flexion x 20 Bue Finger ladder flexion x 10 RUE SL ER 1# x 20 RUE SL HABD 1# x 20 RUE SL ABD 1# x 20 RUE Supine shoulder flexion stretch x 1'  RUE Supine shoulder ER stretch at neutral x 1' RUE  NEUROMUSCULAR RE-EDUCATION: To improve coordination, kinesthesia, and  posture. Seated cane flexion w/ PT facilitation for scapular position/rotation/avoiding UT compensation, keeping grip close doing true flexion vs  scaption Wall ball circles at 90 deg x 20 CW; x 20 CCW w/ PT manual facilitation for scapular positioning, keeping elbow straight Supine snow angel ABD x 20 BUE  10/02/24 UBE L2.5 61F/3B Standing R shld ER stretch in doorway x 1 min Wall slide R shld flexion x 10; scaption x 10 Standing press 1lb bar x 10  Sidelying R shoulder flexion x 10 no weight; 1lb x 10 Sidelying R shoulder horiz ABD x 10; 1lb x 10 Sidelying R shld ER 1lb 2x10 towel at side Prone R rows 4lb 2x10 Prone R shld ext 3lb 2x10  PROM to R shoulder flex, abd, ER   PATIENT EDUCATION:  Education details: medbridge updated with new HEP Person educated: Patient Education method: Explanation, Demonstration, Verbal cues, Tactile cues, and Handouts Education comprehension: verbalized understanding, verbal cues required, tactile cues required, and needs further education  HOME EXERCISE PROGRAM: Access Code: [email or web address] URL: [email or web address] Date: 10/05/2024 Prepared by: Garnette Montclair  Exercises - Seated Shoulder Scaption AAROM with Pulley at Side  - 1 x daily - 7 x weekly - 3 sets - 10 reps - Sidelying Shoulder External Rotation with Dumbbell  - 1 x daily - 7 x weekly - 3 sets - 10 reps - Sidelying Shoulder Horizontal Abduction  - 1 x daily - 7 x weekly - 3 sets - 10 reps - Supine Shoulder External Rotation in Abduction  - 1 x daily - 7 x weekly - 3 sets - 10 reps - Seated Shoulder Abduction Towel Slide at Table Top  - 1 x daily - 7 x weekly - 1 sets - 2 reps - 1 min hold - Doorway Pec Stretch at 90 Degrees Abduction  - 1 x daily - 7 x weekly - 1 sets - 2 reps - 1 min hold - Standing Shoulder Flexion to 90 Degrees  - 1 x daily - 7 x weekly -  2 sets - 10 reps - Shoulder Abduction - Thumbs Up  - 1 x daily - 7 x weekly - 2 sets - 10 reps - Standing Shoulder  Press  - 1 x daily - 7 x weekly - 2 sets - 10 reps - Standing Shoulder Row with Anchored Resistance  - 1 x daily - 7 x weekly - 3 sets - 10 reps - Shoulder extension with resistance - Neutral  - 1 x daily - 7 x weekly - 3 sets - 10 reps - Shoulder External Rotation with Anchored Resistance  - 1 x daily - 7 x weekly - 3 sets - 10 reps - Shoulder Internal Rotation with Resistance  - 1 x daily - 7 x weekly - 3 sets - 10 reps - Wall Push Up with Plus  - 1 x daily - 7 x weekly - 3 sets - 10 reps - Standing Shoulder External Rotation Stretch in Doorway  - 1 x daily - 7 x weekly - 1 sets - 2 reps - 1 min hold ASSESSMENT:  CLINICAL IMPRESSION: Focused today on reeducating on scapulohumeral rhythm and upward rotation rather than elevation of his scapula.  HE is still doing compensatory elevation from 110 degrees and higher in standing positions.   He is also having some winging noted with active movement of the R shoulder.   Added in more serratus strengthening to address this.   He is doing well overall and will f/u with Dr Duwaine soon.   PT remains necessary for strengthening, neuroreeduation of proper shoulder mechanics, end range stretching.   Continue per POC   OBJECTIVE IMPAIRMENTS: decreased ROM, decreased strength, decreased safety awareness, increased edema, increased muscle spasms, impaired flexibility, impaired UE functional use, and pain.   ACTIVITY LIMITATIONS: carrying, lifting, sleeping, bathing, toileting, dressing, self feeding, reach over head, and hygiene/grooming  PARTICIPATION LIMITATIONS: meal prep, cleaning, laundry, driving, and shopping  PERSONAL FACTORS: Age and 1-2 comorbidities: HTN, sciatica, lumbar radiculopathy and chronic back pain are also affecting patient's functional outcome.   REHAB POTENTIAL: Good  CLINICAL DECISION MAKING: Evolving/moderate complexity  EVALUATION COMPLEXITY: Moderate   GOALS: Goals reviewed with patient? Yes  SHORT TERM GOALS: Target date:  10/26/2024   Patient will have R shoulder ROM as follows:  175 degrees flexion; 160 deg ABD; 90 deg ER at neutral and at 90 deg abd; 70  deg IR at 90 deg abd  Baseline:  see ROM tables above Goal status:  IN PROGRESS  2.  Patient will demonstrate 4+/5 R shoulder strength for ability to do haircare, self hygiene, reaching above head Baseline:  has 4-/5 R deltoid strength at 90 degress, but is exhibits compensatory shoulder shrugging and cannot lift the RUE throughout full available PROM  Goal Status:  IN PROGRESS  LONG TERM GOALS: Target date: 11/23/2024   Patient will be independent with ongoing/advanced HEP for self-management at home.  Baseline: 10/17:  continuing to advance weekly 10/24:  still changing every week Goal status: IN PROGRESS-  2.  Patient to improve R shoulder AROM to equal to the L shoulder without pain provocation to allow for increased ease of ADLs.  Baseline: Refer to above UE ROM table 10/05/24:  see updated ROM tables above for PROM and AROM of the R shoulder Goal status: IN PROGRESS-  3.  Patient will demonstrate improved R shoulder strength to >/= 5/5 for functional UE use. Baseline: Refer to above UE MMT table Goal status: IN PROGRESS- 09/25/24  4.  Patient  will be able to lift 5-10 lbs overhead for full return to activity around the house and with doordash   Baseline: not allowed due to surgical restrictions 10/04/24:  can lift a 1 lb dumbbell overhead to 115 degrees of R shoulder flexion with R upper trap shrugging/compensatory pattern Goal status: IN PROGRESS    5.  Patient will score </= 20 on Quick Dash outcome survey for improved R shoulder function and QOL  Baseline:  10/05/24 = 36.36  Goal Status:  IN PROGRESS  PLAN:  PT FREQUENCY: 1-2x/week  PT DURATION: other: 16 weeks  PLANNED INTERVENTIONS: 97164- PT Re-evaluation, 97750- Physical Performance Testing, 97110-Therapeutic exercises, 97530- Therapeutic activity, [id number]- Neuromuscular  re-education, 97535- Self Care, 02859- Manual therapy, [id number]- Electrical stimulation (unattended), 97016- Vasopneumatic device, [id number]- Ultrasound, 79439 (1-2 muscles), 20561 (3+ muscles)- Dry Needling, Patient/Family education, Taping, Joint mobilization, Scar mobilization, Cryotherapy, and Moist heat  PLAN FOR NEXT SESSION:  13 weeks post op 10/08/24.  Antigravity strengthening of deltoid/supraspinatus; work on serratus strength by adding chest press supine w/ weight or standing with TB and pushups w/ plus  Anneka Studer, PT 10/14/2024, 4:11 PM  Date of referral: 06/06/24 Referring provider: Duwaine Sharper Referring diagnosis? 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 (if different than referring diagnosis) R shoulder pain, R shoulder weakness, R shoulder stiffness  What was this (referring dx) caused by? Surgery (Type: R RTC repair, SAD/DCR)  [place] of Condition: Initial Onset (within last 3 months)   Laterality: Rt  Current Functional Measure Score: DASH Quick Dash = 34.1 / 100 = 34.1 %  Objective measurements identify impairments when they are compared to normal values, the uninvolved extremity, and prior level of function.  [x]  Yes  []  No PROM R shoulder:  75 deg flexion; 60 deg ABD, 10 deg ER  Objective assessment of functional ability: Severe functional limitations   Briefly describe symptoms: New R RTC repair with SAD/DCR on 07/05/24; unable to use or move his R shoulder  How did symptoms start: surgery   Average pain intensity:  Last 24 hours: 3/10  Past week: 3/10  How often does the pt experience symptoms? Occasionally  How much have the symptoms interfered with usual daily activities? A little bit  How has condition changed since care began at this facility? Much better  In  general, how is the patients overall health? Good   BACK PAIN (STarT Back Screening Tool) No

## 2024-10-15 ENCOUNTER — Encounter: Payer: Self-pay | Admitting: Radiology

## 2024-10-16 ENCOUNTER — Encounter

## 2024-10-17 ENCOUNTER — Ambulatory Visit: Attending: Sports Medicine

## 2024-10-17 DIAGNOSIS — M25511 Pain in right shoulder: Secondary | ICD-10-CM | POA: Diagnosis present

## 2024-10-17 DIAGNOSIS — M6281 Muscle weakness (generalized): Secondary | ICD-10-CM | POA: Diagnosis present

## 2024-10-17 DIAGNOSIS — M25611 Stiffness of right shoulder, not elsewhere classified: Secondary | ICD-10-CM | POA: Insufficient documentation

## 2024-10-17 NOTE — Therapy (Signed)
 OUTPATIENT PHYSICAL THERAPY SHOULDER TREATMENT        Patient Name: Phillip Hughes MRN: [id number] DOB:25-Feb-1956, 68 y.o., male Today's Date: 10/17/2024   END OF SESSION:  PT End of Session - 10/17/24 0845     Visit Number 23    Date for Recertification  10/30/24    Authorization Type [employer] [facility]    Authorization Time Period 10/17-11/14    Authorization - Visit Number 6    PT Start Time 0802    PT Stop Time 0847    PT Time Calculation (min) 45 min    Activity Tolerance Patient tolerated treatment well;No increased pain    Behavior During Therapy WFL for tasks assessed/performed                  Past Medical History:  Diagnosis Date   Hypertension    Sciatica    Sinusitis    Past Surgical History:  Procedure Laterality Date   BACK SURGERY     HERNIA REPAIR     HIP SURGERY     NECK SURGERY     Patient Active Problem List   Diagnosis Date Noted   Hx of total hip arthroplasty, right 02/28/2023   Degenerative tear of medial meniscus of right knee 12/01/2022   Lumbar radiculopathy 11/29/2022   Peroneal mononeuropathy, 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    PCP: Dorina Loving, PA-C   REFERRING PROVIDER: Duwaine Ozell PARAS., MD   REFERRING DIAG: Tear of R rotator cuff, unspecified tear extent, unspecified whether traumatic (M75.101); 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); Subacromial bursitis R shoulder (M75.51); Chronic R shoulder pain (M25.511, G89.29)  THERAPY DIAG:  Acute pain of right shoulder  Muscle weakness (generalized)  Stiffness of right shoulder, not elsewhere classified  RATIONALE FOR EVALUATION AND TREATMENT: Rehabilitation  ONSET DATE: 07/05/54  NEXT MD VISIT: 10/16/24   SUBJECTIVE:                                                                                                                                                                                                         SUBJECTIVE STATEMENT: Doctor released him from any further  appointments.  PAIN: Are you having pain? Yes: NPRS scale: 0/10 now and worst Pain location: R shoulder Pain description: soreness with overhead movement Aggravating factors: any movements Relieving factors: rest, ice, meds  PERTINENT HISTORY:  R THA, Back surgery 2017, lumbar radiculopathy, Neck surgery, HTN  PRECAUTIONS: Shoulder RTC protocol precautions for Dr Duwaine  RED FLAGS: None  HAND DOMINANCE: Left  WEIGHT BEARING RESTRICTIONS: Yes NWB RUE  FALLS:  Has  patient fallen in last 6 months? 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       10/17/2024 Activities Rating  Open a tight or new jar.  3 = Moderate difficulty  Do heavy household chores (e.g., wash walls, floors). 4 = Severe difficulty  Carry a shopping bag or briefcase 2 = Mild difficulty  Wash your back. 4 = Severe difficulty  Use a knife to cut food. 2 = Mild difficulty  Recreational activities in which you take some force or impact through your arm, shoulder or hand (e.g., golf, hammering, tennis, etc.). 4 = Severe difficulty  During the past week, to what extent has your arm, shoulder or hand problem interfered with your normal social activities with family, friends, neighbors or groups?  2 = Slightly  During the past week, were you limited  in your work or other regular daily activities as a result of your arm, shoulder or hand problem? 2 = Slightly limited  Rate the severity of the following symptoms in the last week: Arm, Shoulder, or hand pain. 2 = Mild  Rate the severity of the following symptoms in the last week: Tingling (pins and needles) in your arm, shoulder or hand. 1 = none  During the past week, how much difficulty have you had sleeping because of the pain in your arm, shoulder or hand?  1 = No difficulty   (A QuickDASH score may not be calculated if there is greater than 1 missing item.)  Quick Dash Disability/Symptom Score: [(sum of 27 (n) responses/11 (n)] x 25 = 36.36  Minimally Clinically Important Difference (MCID): 15-20 points  (Franchignoni, F. et al. (2013). Minimally clinically important difference of the disabilities of the arm, shoulder, and hand outcome measures (DASH) and its shortened version (Quick DASH). Journal of Orthopaedic & Sports Physical Therapy, 44(1), 30-39)   COGNITION: Overall cognitive status: Within functional limits for tasks assessed     SENSATION: WFL  POSTURE: No Significant postural limitations  UPPER EXTREMITY ROM:   Passive ROM Right eval Left eval R 07/23/24 RUE 08/06/24 RUE 08/14/24 RUE 08/21/24 R 08/29/24  RUE 09/04/24 RUE 09/18/24 RUE 09/28/24 RUE 10/05/24  Shoulder flexion 75 180 90 120 130 133 143 149  154 163  Shoulder extension NT 55           Shoulder abduction  175  100 110 105 130 122  133 145  Shoulder adduction             Shoulder internal rotation Hand to stomach in sling 75   Hand to stomach;  30 degrees at 40 deg scaption At 45 deg scaption: 45 degrees 53 At 70 degrees ABD:  60-65 degrees Functional IR is L5 To L4 with strap stretch 40 degrees at 90 deg ABD  Shoulder external rotation 10 80 10 32 37 At 45 degrees scaption:  44 degrees  At 90 deg ABD:  70 deg;  At neutral:  50 degrees 60 deg at neutral 65 deg at neutral; 75 at 90 deg ABD 65 deg at  neutral;  88 deg at 90 ABD  Elbow flexion WNL = BUE            Elbow extension             Wrist flexion             Wrist extension             Wrist ulnar deviation             Wrist radial deviation             Wrist pronation             Wrist supination             (Blank rows = not tested)  UPPER EXTREMITY ROM:  Active ROM RUE AROM 09/21/24 AROM 09/25/24 RUE 09/28/24 RUE 10/05/24  Shoulder flexion 110 w/ UT substitution 112 with shrug 115 with shrug/ 120 deg with shrug at the end range  Shoulder extension 40   50   Shoulder abduction 85 w/ UT substitution 88 with shrug 85 deg with shrug 90 with shrug end ROM  Shoulder adduction      Shoulder internal rotation To upper hip To C6 SI joint To L4;  30 deg At 90 ABD  Shoulder external rotation 45 deg at neutral To lumbar spine parallel to iliac crest 49 deg at 0 deg Abd;  50 deg on table at 90 deg ABD 45 deg at neutral; 80 deg at 90 ABD  Elbow flexion      Elbow extension      Wrist flexion      Wrist extension      Wrist ulnar deviation      Wrist radial deviation      Wrist pronation      Wrist supination       (Blank rows = not tested)   UPPER EXTREMITY MMT: 07/10/24--No strength testing done at eval due to surgical precautions  MMT Right 10/14 Left eval RUE 09/28/24  Shoulder flexion 4  4  Shoulder extension     Shoulder abduction 4  4-  Shoulder adduction     Shoulder internal rotation 4+  4+  Shoulder external rotation 4+  4  Middle trapezius     Lower trapezius     Elbow flexion     Elbow extension     Wrist flexion     Wrist extension     Wrist ulnar deviation     Wrist radial deviation     Wrist pronation     Wrist supination     Grip strength (lbs)     (Blank rows = not tested)  SHOULDER SPECIAL TESTS:  N/A   JOINT MOBILITY TESTING:  N/A  PALPATION:  TTP over the anterior lateral deltoid area and upper traps    TODAY'S TREATMENT:  10/17/24 THERAPEUTIC EXERCISE: To improve strength and  endurance.  Demonstration, verbal and tactile cues throughout for technique. UBE L3.0 3 min each way S/L R shoulder ER 3lb 2x10  THERAPEUTIC ACTIVITIES: To improve functional performance.  Demonstration, verbal and tactile cues throughout for technique. Seated R shoulder flexion 1lb x 10; R shoulder scaption 1lb x 10 Wall slide R shld flex 2lb cuff weight 2x10; scaption 2x10 2lb  Serratus slide with RTB 2x10 Standing ER  RTB back to doorframe 10x3'; 2 sets S/L R shoulder flexion 3lb 2x10  STM to R infra and supraspinatus with MWM in L s/l- helped with ROM and scapulohumeral rythm  10/12/24 THERAPEUTIC EXERCISE: To improve strength and endurance.  Demonstration, verbal and tactile cues throughout for technique. UBE L2.5 x 3'F/3'B  NEUROMUSCULAR RE-EDUCATION: To improve coordination, kinesthesia, posture, and proprioception. Supine shoulder ABD w/ PT facilitation for scapular depression while patient isometrically holds depression x 3' of repetitions Supine shoulder ABD w/ PT Holding lateral scapular border to prevent upward rotation to isolate GH motion and inferior joint stretch x 1' x 2 Supine serratus punches 4# x 30 RUE  Table pushups x 10 BUE Doorway at 90/90 lift off scapular retraction x 30 BUE Wall slides with HABD YTB for serratus activation x 30 BUE   THERAPEUTIC ACTIVITIES: To improve functional performance.  Demonstration, verbal and tactile cues throughout for technique. SL ER 2# x 30 RUE SL HABD 2# x 30 RUE Standing rowing doubled blue TB x 30 RUE Standing chest press YTB x 30 RUE Standing ER YTB x 30 RUE Standing IR GTB x 30 RUE Holding Top of doorframe flexion stretch x 1' BUE   10/09/24 THERAPEUTIC EXERCISE: Pulleys flexion x UBE L3.0  x 2'F/2'B Supine shoulder flex w/ 1lb bar 3x for 10 sec holds Standing IR RTB arm at side x 20 Standing ER RTB arm at side x 12 S/l ER 1lb x 20  THERAPEUTIC ACTIVITIES: To improve functional performance.  Demonstration,  verbal and tactile cues throughout for technique. Finger ladder flexion all the way up x 10 with ecc lowering Finger ladder scaption all the way up x 10 with ecc lowering Wall CW/CCW circles 30 sec intervals; flexion then scaption Standing golf swings with blue TB x 20 Standing shoulder flexion YTB to 90 deg x 10; scaption x 10       10/05/24 THERAPEUTIC EXERCISE: UBE L2.5  x 3'F/3'B  THERAPEUTIC ACTIVITIES: To improve functional performance.  Demonstration, verbal and tactile cues throughout for technique. Standing row blue TB doubled x 30 RUE Standing shoulder ext RTB doubled x 30 RUE Standing ER YTB x 20 RUE Standing IR GTB x 20 RUE Wall dust to full flexion x 20 RUE Wall ball roll up flexion x 20 Bue Finger ladder flexion x 10 RUE SL ER 1# x 20 RUE SL HABD 1# x 20 RUE SL ABD 1# x 20 RUE Supine shoulder flexion stretch x 1'  RUE Supine shoulder ER stretch at neutral x 1' RUE  NEUROMUSCULAR RE-EDUCATION: To improve coordination, kinesthesia, and posture. Seated cane flexion w/ PT facilitation for scapular position/rotation/avoiding UT compensation, keeping grip close doing true flexion vs  scaption Wall ball circles at 90 deg x 20 CW; x 20 CCW w/ PT manual facilitation for scapular positioning, keeping elbow straight Supine snow angel ABD x 20 BUE  10/02/24 UBE L2.5 86F/3B Standing R shld ER stretch in doorway x 1 min Wall slide R shld flexion x 10; scaption x 10 Standing press 1lb bar x 10  Sidelying R shoulder flexion x 10 no weight; 1lb x 10 Sidelying R shoulder horiz ABD x 10; 1lb x 10 Sidelying R shld ER 1lb 2x10 towel at side Prone R rows 4lb 2x10 Prone R shld ext 3lb 2x10  PROM to R shoulder flex, abd, ER   PATIENT EDUCATION:  Education details: medbridge updated with new HEP Person educated: Patient Education method: Explanation, Demonstration, Verbal cues, Tactile cues, and Handouts  Education comprehension: verbalized understanding, verbal cues required,  tactile cues required, and needs further education  HOME EXERCISE PROGRAM: Access Code: [email or web address] URL: [email or web address] Date: 10/05/2024 Prepared by: Garnette Montclair  Exercises - Seated Shoulder Scaption AAROM with Pulley at Side  - 1 x daily - 7 x weekly - 3 sets - 10 reps - Sidelying Shoulder External Rotation with Dumbbell  - 1 x daily - 7 x weekly - 3 sets - 10 reps - Sidelying Shoulder Horizontal Abduction  - 1 x daily - 7 x weekly - 3 sets - 10 reps - Supine Shoulder External Rotation in Abduction  - 1 x daily - 7 x weekly - 3 sets - 10 reps - Seated Shoulder Abduction Towel Slide at Table Top  - 1 x daily - 7 x weekly - 1 sets - 2 reps - 1 min hold - Doorway Pec Stretch at 90 Degrees Abduction  - 1 x daily - 7 x weekly - 1 sets - 2 reps - 1 min hold - Standing Shoulder Flexion to 90 Degrees  - 1 x daily - 7 x weekly - 2 sets - 10 reps - Shoulder Abduction - Thumbs Up  - 1 x daily - 7 x weekly - 2 sets - 10 reps - Standing Shoulder Press  - 1 x daily - 7 x weekly - 2 sets - 10 reps - Standing Shoulder Row with Anchored Resistance  - 1 x daily - 7 x weekly - 3 sets - 10 reps - Shoulder extension with resistance - Neutral  - 1 x daily - 7 x weekly - 3 sets - 10 reps - Shoulder External Rotation with Anchored Resistance  - 1 x daily - 7 x weekly - 3 sets - 10 reps - Shoulder Internal Rotation with Resistance  - 1 x daily - 7 x weekly - 3 sets - 10 reps - Wall Push Up with Plus  - 1 x daily - 7 x weekly - 3 sets - 10 reps - Standing Shoulder External Rotation Stretch in Doorway  - 1 x daily - 7 x weekly - 1 sets - 2 reps - 1 min hold ASSESSMENT:  CLINICAL IMPRESSION: Focused today on scapulohumeral rhythm and upward rotation rather than elevation of his scapula.  HE is still doing compensatory elevation from 110 degrees and higher in standing positions.  Pt is released from MD and has two more visits approved through insurance, so can plan for discharge to HEP after  these two visits. PT remains necessary for strengthening, neuroreeduation of proper shoulder mechanics, end range stretching.   Continue per POC   OBJECTIVE IMPAIRMENTS: decreased ROM, decreased strength, decreased safety awareness, increased edema, increased muscle spasms, impaired flexibility, impaired UE functional use, and pain.   ACTIVITY LIMITATIONS: carrying, lifting, sleeping, bathing, toileting, dressing, self feeding, reach over head, and hygiene/grooming  PARTICIPATION LIMITATIONS: meal prep, cleaning, laundry, driving, and shopping  PERSONAL FACTORS: Age and 1-2 comorbidities: HTN, sciatica, lumbar radiculopathy and chronic back pain are also affecting patient's functional outcome.   REHAB POTENTIAL: Good  CLINICAL DECISION MAKING: Evolving/moderate complexity  EVALUATION COMPLEXITY: Moderate   GOALS: Goals reviewed with patient? Yes  SHORT TERM GOALS: Target date: 10/26/2024   Patient will have R shoulder ROM as follows:  175 degrees flexion; 160 deg ABD; 90 deg ER at neutral and at 90 deg abd; 70  deg IR at 90 deg abd  Baseline:  see ROM tables above Goal status:  IN PROGRESS  2.  Patient will demonstrate 4+/5 R shoulder strength for ability to do haircare, self hygiene, reaching above head Baseline:  has 4-/5 R deltoid strength at 90 degress, but is exhibits compensatory shoulder shrugging and cannot lift the RUE throughout full available PROM  Goal Status:  IN PROGRESS  LONG TERM GOALS: Target date: 11/23/2024   Patient will be independent with ongoing/advanced HEP for self-management at home.  Baseline: 10/17:  continuing to advance weekly 10/24:  still changing every week Goal status: IN PROGRESS-  2.  Patient to improve R shoulder AROM to equal to the L shoulder without pain provocation to allow for increased ease of ADLs.  Baseline: Refer to above UE ROM table 10/05/24:  see updated ROM tables above for PROM and AROM of the R shoulder Goal status: IN  PROGRESS-  3.  Patient will demonstrate improved R shoulder strength to >/= 5/5 for functional UE use. Baseline: Refer to above UE MMT table Goal status: IN PROGRESS- 09/25/24  4.  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   Baseline: not allowed due to surgical restrictions 10/04/24:  can lift a 1 lb dumbbell overhead to 115 degrees of R shoulder flexion with R upper trap shrugging/compensatory pattern Goal status: IN PROGRESS    5.  Patient will score </= 20 on Quick Dash outcome survey for improved R shoulder function and QOL  Baseline:  10/05/24 = 36.36  Goal Status:  IN PROGRESS  PLAN:  PT FREQUENCY: 1-2x/week  PT DURATION: other: 16 weeks  PLANNED INTERVENTIONS: 97164- PT Re-evaluation, 97750- Physical Performance Testing, 97110-Therapeutic exercises, 97530- Therapeutic activity, [id number]- Neuromuscular re-education, 97535- Self Care, 02859- Manual therapy, [id number]- Electrical stimulation (unattended), 97016- Vasopneumatic device, [id number]- Ultrasound, 79439 (1-2 muscles), 20561 (3+ muscles)- Dry Needling, Patient/Family education, Taping, Joint mobilization, Scar mobilization, Cryotherapy, and Moist heat  PLAN FOR NEXT SESSION:  2 more visits approved by insurance;15 weeks post op 10/22/24. Add gym equipment (pt request); Antigravity strengthening of deltoid/supraspinatus; work on serratus strength by adding chest press supine w/ weight or standing with TB and pushups w/ plus  Andren Bethea L Raenah Murley, PTA 10/17/2024, 8:52 AM  Date of referral: 06/06/24 Referring provider: Duwaine Sharper Referring diagnosis? 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 (if different than referring diagnosis) R shoulder pain, R shoulder weakness, R shoulder  stiffness  What was this (referring dx) caused by? Surgery (Type: R RTC repair, SAD/DCR)  Lysle of Condition: Initial Onset (within last 3 months)   Laterality: Rt  Current Functional Measure Score: DASH Quick Dash = 34.1 / 100 = 34.1 %  Objective measurements identify impairments when they are compared to normal values, the uninvolved extremity, and prior level of function.  [x]  Yes  []  No PROM R shoulder:  75 deg flexion; 60 deg ABD, 10 deg ER  Objective assessment of functional ability: Severe functional limitations   Briefly describe symptoms: New R RTC repair with SAD/DCR on 07/05/24; unable to use or move his R shoulder  How did symptoms start: surgery   Average pain intensity:  Last 24 hours: 3/10  Past week: 3/10  How often does the pt experience symptoms? Occasionally  How much have the symptoms interfered with usual daily activities? A little bit  How has condition changed since care began at this facility? Much better  In general, how is the patients overall health? Good   BACK PAIN (STarT Back Screening Tool) No

## 2024-10-19 ENCOUNTER — Ambulatory Visit: Admitting: Rehabilitation

## 2024-10-19 DIAGNOSIS — M6281 Muscle weakness (generalized): Secondary | ICD-10-CM

## 2024-10-19 DIAGNOSIS — M25511 Pain in right shoulder: Secondary | ICD-10-CM | POA: Diagnosis not present

## 2024-10-19 DIAGNOSIS — M25611 Stiffness of right shoulder, not elsewhere classified: Secondary | ICD-10-CM

## 2024-10-19 NOTE — Therapy (Signed)
 OUTPATIENT PHYSICAL THERAPY SHOULDER TREATMENT / DC SUMMARY       Patient Name: Phillip Hughes MRN: [id number] DOB:[birth year], 68 y.o., male Today's Date: 10/19/2024   END OF SESSION:  PT End of Session - 10/19/24 0803     Visit Number 24    Date for Recertification  10/30/24    Authorization Type [employer] MCR    Authorization Time Period 10/17-11/14    PT Start Time 0800    PT Stop Time 0845    PT Time Calculation (min) 45 min    Activity Tolerance Patient tolerated treatment well;No increased pain    Behavior During Therapy WFL for tasks assessed/performed                  Past Medical History:  Diagnosis Date   Hypertension    Sciatica    Sinusitis    Past Surgical History:  Procedure Laterality Date   BACK SURGERY     HERNIA REPAIR     HIP SURGERY     NECK SURGERY     Patient Active Problem List   Diagnosis Date Noted   Hx of total hip arthroplasty, right 02/28/2023   Degenerative tear of medial meniscus of right knee 12/01/2022   Lumbar radiculopathy 11/29/2022   Peroneal mononeuropathy, 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    PCP: Dorina Loving, PA-C   REFERRING PROVIDER: Duwaine Ozell PARAS., MD   REFERRING DIAG: Tear of R rotator cuff, unspecified tear extent, unspecified whether traumatic (M75.101); 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); Subacromial bursitis R shoulder (M75.51); Chronic R shoulder pain (M25.511, G89.29)  THERAPY DIAG:  Acute pain of right shoulder  Muscle weakness (generalized)  Stiffness of right shoulder, not elsewhere classified  RATIONALE FOR EVALUATION AND TREATMENT: Rehabilitation  ONSET DATE: [birth year]  NEXT MD VISIT: 10/16/24   SUBJECTIVE:                                                                                                                                                                                                         SUBJECTIVE STATEMENT: States saw Dr Duwaine and that he was given good report and no need to return unless he  has a problem with the R shoulder.   States ortho was pleased with his progress.    PAIN: Are you having pain? Yes: NPRS scale: 0/10 now and worst Pain location: R shoulder Pain description: soreness with overhead movement Aggravating factors: any movements Relieving factors: rest, ice, meds  PERTINENT HISTORY:  R THA, Back surgery 2017, lumbar radiculopathy, Neck surgery, HTN  PRECAUTIONS: Shoulder RTC protocol  precautions for Dr Duwaine  RED FLAGS: None  HAND DOMINANCE: Left  WEIGHT BEARING RESTRICTIONS: Yes NWB RUE  FALLS:  Has patient fallen in last 6 months? 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       10/19/2024 Activities Rating  Open a tight or new jar.  3 = Moderate difficulty  Do heavy household chores (e.g., wash walls, floors). 4 = Severe difficulty  Carry a shopping bag or briefcase 2 = Mild difficulty  Wash your back. 4 = Severe difficulty  Use a knife to cut food. 2 = Mild difficulty  Recreational activities in which you take some force or impact through your arm, shoulder or hand (e.g., golf, hammering, tennis, etc.). 4 = Severe difficulty  During the past week, to what extent has your arm, shoulder or hand problem interfered with your normal social activities with family, friends,  neighbors or groups?  2 = Slightly  During the past week, were you limited in your work or other regular daily activities as a result of your arm, shoulder or hand problem? 2 = Slightly limited  Rate the severity of the following symptoms in the last week: Arm, Shoulder, or hand pain. 2 = Mild  Rate the severity of the following symptoms in the last week: Tingling (pins and needles) in your arm, shoulder or hand. 1 = none  During the past week, how much difficulty have you had sleeping because of the pain in your arm, shoulder or hand?  1 = No difficulty   (A QuickDASH score may not be calculated if there is greater than 1 missing item.)  Quick Dash Disability/Symptom Score: [(sum of 27 (n) responses/11 (n)] x 25 = 36.36  Minimally Clinically Important Difference (MCID): 15-20 points  (Franchignoni, F. et al. (2013). Minimally clinically important difference of the disabilities of the arm, shoulder, and hand outcome measures (DASH) and its shortened version (Quick DASH). Journal of Orthopaedic & Sports Physical Therapy, 44(1), 30-39)   COGNITION: Overall cognitive status: Within functional limits for tasks assessed     SENSATION: WFL  POSTURE: No Significant postural limitations  UPPER EXTREMITY ROM:   Passive ROM Right eval Left eval R 07/23/24 RUE 08/06/24 RUE 08/14/24 RUE 08/21/24 R 08/29/24  RUE 09/04/24 RUE 09/18/24 RUE 09/28/24 RUE 10/05/24  Shoulder flexion 75 180 90 120 130 133 143 149  154 163  Shoulder extension NT 55           Shoulder abduction  175  100 110 105 130 122  133 145  Shoulder adduction             Shoulder internal rotation Hand to stomach in sling 75   Hand to stomach;  30 degrees at 40 deg scaption At 45 deg scaption: 45 degrees 53 At 70 degrees ABD:  60-65 degrees Functional IR is L5 To L4 with strap stretch  40 degrees at 90 deg ABD  Shoulder external rotation 10 80 10 32 37 At 45 degrees scaption:  44 degrees  At 90 deg ABD:  70 deg;  At neutral:  50  degrees 60 deg at neutral 65 deg at neutral; 75 at 90 deg ABD 65 deg at neutral;  88 deg at 90 ABD  Elbow flexion WNL = BUE            Elbow extension             Wrist flexion             Wrist extension             Wrist ulnar deviation             Wrist radial deviation             Wrist pronation             Wrist supination             (Blank rows = not tested)  UPPER EXTREMITY ROM:  Active ROM RUE AROM 09/21/24 AROM 09/25/24 RUE 09/28/24 RUE 10/05/24  Shoulder flexion 110 w/ UT substitution 112 with shrug 115 with shrug/ 120 deg with shrug at the end range  Shoulder extension 40   50   Shoulder abduction 85 w/ UT substitution 88 with shrug 85 deg with shrug 90 with shrug end ROM  Shoulder adduction      Shoulder internal rotation To upper hip To C6 SI joint To L4;  30 deg At 90 ABD  Shoulder external rotation 45 deg at neutral To lumbar spine parallel to iliac crest 49 deg at 0 deg Abd;  50 deg on table at 90 deg ABD 45 deg at neutral; 80 deg at 90 ABD  Elbow flexion      Elbow extension      Wrist flexion      Wrist extension      Wrist ulnar deviation      Wrist radial deviation      Wrist pronation      Wrist supination       (Blank rows = not tested)   UPPER EXTREMITY MMT: 07/10/24--No strength testing done at eval due to surgical precautions  MMT Right 10/14 Left eval RUE 09/28/24  Shoulder flexion 4  4  Shoulder extension     Shoulder abduction 4  4-  Shoulder adduction     Shoulder internal rotation 4+  4+  Shoulder external rotation 4+  4  Middle trapezius     Lower trapezius     Elbow flexion     Elbow extension     Wrist flexion     Wrist extension     Wrist ulnar deviation     Wrist radial deviation     Wrist pronation     Wrist supination     Grip strength (lbs)     (Blank rows = not tested)  SHOULDER SPECIAL TESTS:  N/A   JOINT MOBILITY TESTING:  N/A  PALPATION:  TTP over the anterior lateral deltoid area and upper  traps    TODAY'S TREATMENT:  10/19/24 THERAPEUTIC EXERCISE: To improve strength and endurance.  Demonstration, verbal and tactile cues throughout for technique. UBE L3.0 3 min each way Pulleys x 2' flexion, 2' ABD  THERAPEUTIC ACTIVITIES: To improve functional performance.  Demonstration, verbal and tactile cues throughout for technique. Lat PD 20# x 20 Seated Row 25# x 20  low grip;  15# x 20 high grip Chest press 5# x 30 vertical grip BUE Front deltoid raise thumb up 1# x 20 Lateral deltoid raise 1# x 20 RUE Shoulder press up 1# x 20 RUE SL ER 1# x 30 RUE SL HABD 1# x 30 RUE Supine scapular plus 5# x 30 w/ PT assist for form  10/17/24 THERAPEUTIC EXERCISE: To improve strength and endurance.  Demonstration, verbal and tactile cues throughout for technique. UBE L3.0 3 min each way S/L R shoulder ER 3lb 2x10  THERAPEUTIC ACTIVITIES: To improve functional performance.  Demonstration, verbal and tactile cues throughout for technique. Seated R shoulder flexion 1lb x 10; R shoulder scaption 1lb x 10 Wall slide R shld flex 2lb cuff weight 2x10; scaption 2x10 2lb  Serratus slide with RTB 2x10 Standing ER RTB back to doorframe 10x3'; 2 sets S/L R shoulder flexion 3lb 2x10  STM to R infra and supraspinatus with MWM in L s/l- helped with ROM and scapulohumeral rythm  10/12/24 THERAPEUTIC EXERCISE: To improve strength and endurance.  Demonstration, verbal and tactile cues throughout for technique. UBE L2.5 x 3'F/3'B  NEUROMUSCULAR RE-EDUCATION: To improve coordination, kinesthesia, posture, and proprioception. Supine shoulder ABD w/ PT facilitation for scapular depression while patient isometrically holds depression x 3' of repetitions Supine shoulder ABD w/ PT Holding lateral scapular border to prevent upward rotation to isolate GH motion and inferior joint stretch x 1' x 2 Supine serratus punches 4# x 30 RUE  Table pushups x 10 BUE Doorway at 90/90 lift off scapular retraction x 30  BUE Wall slides with HABD YTB for serratus activation x 30 BUE   THERAPEUTIC ACTIVITIES: To improve functional performance.  Demonstration, verbal and tactile cues throughout for technique. SL ER 2# x 30 RUE SL HABD 2# x 30 RUE Standing rowing doubled blue TB x 30 RUE Standing chest press YTB x 30 RUE Standing ER YTB x 30 RUE Standing IR GTB x 30 RUE Holding Top of doorframe flexion stretch x 1' BUE   10/09/24 THERAPEUTIC EXERCISE: Pulleys flexion x UBE L3.0  x 2'F/2'B Supine shoulder flex w/ 1lb bar 3x for 10 sec holds Standing IR RTB arm at side x 20 Standing ER RTB arm at side x 12 S/l ER 1lb x 20  THERAPEUTIC ACTIVITIES: To improve functional performance.  Demonstration, verbal and tactile cues throughout for technique. Finger ladder flexion all the way up x 10 with ecc lowering Finger ladder scaption all the way up x 10 with ecc lowering Wall CW/CCW circles 30 sec intervals; flexion then scaption Standing golf swings with blue TB x 20 Standing shoulder flexion YTB to 90 deg x 10; scaption x 10       10/05/24 THERAPEUTIC EXERCISE: UBE L2.5  x 3'F/3'B  THERAPEUTIC ACTIVITIES: To improve functional performance.  Demonstration, verbal and tactile cues throughout for technique. Standing row blue TB doubled x 30 RUE Standing shoulder ext RTB doubled x 30 RUE Standing ER YTB x 20 RUE Standing IR GTB x 20 RUE Wall dust to full flexion x 20 RUE Wall ball roll up flexion x 20 Bue Finger ladder flexion x 10 RUE SL ER 1# x 20 RUE SL HABD 1# x 20 RUE SL ABD 1# x 20 RUE Supine shoulder flexion stretch x 1'  RUE Supine shoulder ER stretch at neutral x 1' RUE  NEUROMUSCULAR RE-EDUCATION: To improve coordination, kinesthesia, and posture. Seated cane flexion w/ PT facilitation for scapular position/rotation/avoiding UT compensation, keeping grip close doing  true flexion vs  scaption Wall ball circles at 90 deg x 20 CW; x 20 CCW w/ PT manual facilitation for scapular  positioning, keeping elbow straight Supine snow angel ABD x 20 BUE  10/02/24 UBE L2.5 34F/3B Standing R shld ER stretch in doorway x 1 min Wall slide R shld flexion x 10; scaption x 10 Standing press 1lb bar x 10  Sidelying R shoulder flexion x 10 no weight; 1lb x 10 Sidelying R shoulder horiz ABD x 10; 1lb x 10 Sidelying R shld ER 1lb 2x10 towel at side Prone R rows 4lb 2x10 Prone R shld ext 3lb 2x10  PROM to R shoulder flex, abd, ER   PATIENT EDUCATION:  Education details: medbridge updated with new HEP Person educated: Patient Education method: Explanation, Demonstration, Verbal cues, Tactile cues, and Handouts Education comprehension: verbalized understanding, verbal cues required, tactile cues required, and needs further education  HOME EXERCISE PROGRAM: Access Code: [email or web address] URL: [email or web address] Date: 10/19/2024 Prepared by: Garnette Montclair  Exercises - Seated Shoulder Scaption AAROM with Pulley at Side  - 1 x daily - 7 x weekly - 3 sets - 10 reps - Standing Shoulder External Rotation Stretch in Doorway  - 1 x daily - 7 x weekly - 1 sets - 2 reps - 1 min hold - Doorway Pec Stretch at 90 Degrees Abduction  - 1 x daily - 7 x weekly - 1 sets - 2 reps - 1 min hold - Sidelying Shoulder External Rotation with Dumbbell  - 1 x daily - 7 x weekly - 3 sets - 10 reps - Sidelying Shoulder Horizontal Abduction  - 1 x daily - 7 x weekly - 3 sets - 10 reps - Standing Shoulder Row with Anchored Resistance  - 1 x daily - 7 x weekly - 3 sets - 10 reps - Shoulder extension with resistance - Neutral  - 1 x daily - 7 x weekly - 3 sets - 10 reps - Shoulder External Rotation with Anchored Resistance  - 1 x daily - 7 x weekly - 3 sets - 10 reps - Shoulder Internal Rotation with Resistance  - 1 x daily - 7 x weekly - 3 sets - 10 reps - Wall Push Up with Plus  - 1 x daily - 7 x weekly - 3 sets - 10 reps - Push Up on Table  - 1 x daily - 7 x weekly - 3 sets - 10 reps -  Standing Shoulder Flexion to 90 Degrees with Dumbbells  - 1 x daily - 7 x weekly - 3 sets - 10 reps - Shoulder Abduction with Dumbbells - Thumbs Up  - 1 x daily - 7 x weekly - 3 sets - 10 reps - Shoulder Overhead Press in Flexion with Dumbbells  - 1 x daily - 7 x weekly - 3 sets - 10 reps - Supine Scapular Protraction in Flexion with Dumbbells  - 1 x daily - 7 x weekly - 3 sets - 10 reps - Seated Row Cable Machine  - 1 x daily - 7 x weekly - 3 sets - 10 reps - Lat Pull Down  - 1 x daily - 7 x weekly - 3 sets - 10 reps ASSESSMENT:  CLINICAL IMPRESSION:  Patient is ready for and requesting D/C from PT today.  R shoulder PROM as follows:   160 deg flexion, 150 deg ABD, 60 deg ER, 50 deg IR at 90.    R shoulder AROM as follows:  145  deg flexion, 140 deg ABD, 30 deg ER at neutral, functional ER is C4, functional IR is to L4.    R shoulder strength as follows:  4+ flexion; 4- ABD; 4+ ER; 5/5 IR.   Patient is pleased with his progress and is going to have back surgery in 1 month.   He is requesting D/C PT and we feel that he can continue on his own at this time.  Reviewed and updated all of his HEP and included instructions on gym weight machines.     He is to call us  with any further questions.   D/C PT   OBJECTIVE IMPAIRMENTS: decreased ROM, decreased strength, decreased safety awareness, increased edema, increased muscle spasms, impaired flexibility, impaired UE functional use, and pain.   ACTIVITY LIMITATIONS: carrying, lifting, sleeping, bathing, toileting, dressing, self feeding, reach over head, and hygiene/grooming  PARTICIPATION LIMITATIONS: meal prep, cleaning, laundry, driving, and shopping  PERSONAL FACTORS: Age and 1-2 comorbidities: HTN, sciatica, lumbar radiculopathy and chronic back pain are also affecting patient's functional outcome.   REHAB POTENTIAL: Good  CLINICAL DECISION MAKING: Evolving/moderate complexity  EVALUATION COMPLEXITY: Moderate   GOALS: Goals reviewed with  patient? Yes  SHORT TERM GOALS: Target date: 10/26/2024   Patient will have R shoulder ROM as follows:  175 degrees flexion; 160 deg ABD; 90 deg ER at neutral and at 90 deg abd; 70  deg IR at 90 deg abd  Baseline:  see ROM tables above Goal status:  MET  2.  Patient will demonstrate 4+/5 R shoulder strength for ability to do haircare, self hygiene, reaching above head Baseline:  has 4-/5 R deltoid strength at 90 degress, but is exhibits compensatory shoulder shrugging and cannot lift the RUE throughout full available PROM  Goal Status:  PARTIALLY MET  LONG TERM GOALS: Target date: 11/23/2024   Patient will be independent with ongoing/advanced HEP for self-management at home.  Baseline: 10/17:  continuing to advance weekly 10/24:  still changing every week Goal status: MET-  2.  Patient to improve R shoulder AROM to equal to the L shoulder without pain provocation to allow for increased ease of ADLs.  Baseline: Refer to above UE ROM table 10/05/24:  see updated ROM tables above for PROM and AROM of the R shoulder Goal status: PARTIAL MET-  3.  Patient will demonstrate improved R shoulder strength to >/= 5/5 for functional UE use. Baseline: Refer to above UE MMT table Goal status: SEE ASSESSMENT NOTE  4.  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   Baseline: not allowed due to surgical restrictions 10/04/24:  can lift a 1 lb dumbbell overhead to 115 degrees of R shoulder flexion with R upper trap shrugging/compensatory pattern Goal status: PARTIAL MET--CAN LIFT 2 LBS OVERHEAD TODAY    5.  Patient will score </= 20 on Quick Dash outcome survey for improved R shoulder function and QOL  Baseline:  10/05/24 = 36.36  Goal Status:  IN PROGRESS  PLAN:  PT FREQUENCY: 1-2x/week  PT DURATION: other: 16 weeks  PLANNED INTERVENTIONS: 97164- PT Re-evaluation, 97750- Physical Performance Testing, 97110-Therapeutic  exercises, 97530- Therapeutic activity, [id number]- Neuromuscular re-education, 97535- Self Care, 02859- Manual therapy, G0283- Electrical stimulation (unattended), 97016- Vasopneumatic device, [id number]- Ultrasound, 79439 (1-2 muscles), 20561 (3+ muscles)- Dry Needling, Patient/Family education, Taping, Joint mobilization, Scar mobilization, Cryotherapy, and Moist heat  PLAN FOR NEXT SESSION:  D/C PT  PHYSICAL THERAPY DISCHARGE SUMMARY  Visits from Start of Care: 24  Current functional level related to goals / functional outcomes: See above notes in assessments and goals for functional level   Remaining deficits: Very minimal compensations with shoulder elevation   Education / Equipment: Patient is independent with all home exercises and advised to continue daily as tolerated and call us  with any questions   Patient agrees to discharge. Patient goals were met. Patient is being discharged due to meeting the stated rehab goals.   Donica Derouin, PT 10/19/2024, 9:59 AM

## 2024-10-23 ENCOUNTER — Encounter: Admitting: Rehabilitation

## 2024-10-26 ENCOUNTER — Encounter

## 2024-10-29 ENCOUNTER — Ambulatory Visit (INDEPENDENT_AMBULATORY_CARE_PROVIDER_SITE_OTHER): Admitting: Medical

## 2024-10-29 ENCOUNTER — Encounter: Payer: Self-pay | Admitting: Medical

## 2024-10-29 ENCOUNTER — Ambulatory Visit: Payer: Self-pay | Admitting: Medical

## 2024-10-29 VITALS — BP 149/78 | HR 96 | Temp 98.3°F | Resp 16 | Ht 67.0 in | Wt 162.8 lb

## 2024-10-29 DIAGNOSIS — R7989 Other specified abnormal findings of blood chemistry: Secondary | ICD-10-CM

## 2024-10-29 DIAGNOSIS — E785 Hyperlipidemia, unspecified: Secondary | ICD-10-CM | POA: Diagnosis not present

## 2024-10-29 DIAGNOSIS — Z87891 Personal history of nicotine dependence: Secondary | ICD-10-CM

## 2024-10-29 DIAGNOSIS — I10 Essential (primary) hypertension: Secondary | ICD-10-CM

## 2024-10-29 DIAGNOSIS — R350 Frequency of micturition: Secondary | ICD-10-CM

## 2024-10-29 DIAGNOSIS — Z1159 Encounter for screening for other viral diseases: Secondary | ICD-10-CM | POA: Diagnosis not present

## 2024-10-29 DIAGNOSIS — R7689 Other specified abnormal immunological findings in serum: Secondary | ICD-10-CM

## 2024-10-29 LAB — COMPREHENSIVE METABOLIC PANEL WITH GFR
ALT: 21 U/L (ref 0–53)
AST: 24 U/L (ref 0–37)
Albumin: 4.3 g/dL (ref 3.5–5.2)
Alkaline Phosphatase: 89 U/L (ref 39–117)
BUN: 11 mg/dL (ref 6–23)
CO2: 27 meq/L (ref 19–32)
Calcium: 9.4 mg/dL (ref 8.4–10.5)
Chloride: 102 meq/L (ref 96–112)
Creatinine, Ser: 0.97 mg/dL (ref 0.40–1.50)
GFR: 79.54 mL/min (ref >60.00)
Glucose, Bld: 85 mg/dL (ref 70–99)
Potassium: 3.9 meq/L (ref 3.5–5.1)
Sodium: 140 meq/L (ref 135–145)
Total Bilirubin: 0.6 mg/dL (ref 0.2–1.2)
Total Protein: 8 g/dL (ref 6.0–8.3)

## 2024-10-29 NOTE — Patient Instructions (Addendum)
 Essential hypertension Blood pressure elevated at 149/80 mmHg. Prefers to avoid additional antihypertensive medication due to polypharmacy concerns and past side effects. - Continue hydrochlorothiazide  25 mg daily. - Monitor blood pressure at home daily for one week and send readings via MyChart. - Consider adding losartan if home readings remain elevated.  Chronic pain, back and leg Chronic pain may contribute to elevated blood pressure. Scheduled for surgery next month, which may alleviate symptoms.  History of mixed hyperlipidemia Previously elevated cholesterol levels. Recent levels normal. No current medication due to past adverse reaction. - Checked cholesterol levels today.  History of tobacco use Heavy smoking history warrants consideration for lung cancer screening. - Referred to pulmonologist for evaluation of eligibility for [place] lung cancer screening.  General Health Maintenance Discussed importance of vaccinations, including flu shot and shingles vaccine. Increased risk due to lack of chickenpox history. - Recommended shingles vaccine. - Encouraged flu vaccination.  Follow up date to be determined after lab review and update.

## 2024-10-29 NOTE — Progress Notes (Signed)
   Subjective:    Patient ID: Phillip Hughes, male    DOB: 06/17/1956, 68 y.o.   MRN: [id number]  HPI  Pt  with hypertension who presents with elevated blood pressure and chronic pain.  His  blood pressure is elevated today, potentially due to chronic back and leg pain. He is on hydrochlorothiazide  25 mg daily for hypertension. His blood pressure was 129/76 at a recent visit. He is scheduled for surgery next month to address her chronic pain.  His  A1c was within the controlled range at the end of last month. He has hyperlipidemia but is not on medication due to a past reaction. Recent cholesterol levels have been normal.  He has a significant smoking history, having quit in May 2000 after smoking a pack a day from age 68 to 29. He does  experience frequent urination currently intermittently.  Review of Systems See hpi    Objective:   Physical Exam  General Mental Status- Alert. General Appearance- Not in acute distress.   Skin General: Color- Normal Color. Moisture- Normal Moisture.  Neck No carotid bruits. No JVD.  Chest and Lung Exam Auscultation: Breath Sounds:-Normal.  Cardiovascular Auscultation:Rythm- Regular. Murmurs & Other Heart Sounds:Auscultation of the heart reveals- No Murmurs.  Abdomen Inspection:-Inspeection Normal. Palpation/Percussion:Note:No mass. Palpation and Percussion of the abdomen reveal- Non Tender, Non Distended + BS, no rebound or guarding.    Neurologic Cranial Nerve exam:- CN III-XII intact(No nystagmus), symmetric smile. Strength:- 5/5 equal and symmetric strength both upper and lower extremities.       Assessment & Plan:  Essential hypertension Blood pressure elevated at 149/80 mmHg. Prefers to avoid additional antihypertensive medication due to polypharmacy concerns and past side effects. - Continue hydrochlorothiazide  25 mg daily. - Monitor blood pressure at home daily for one week and send readings via MyChart. - Consider adding  losartan if home readings remain elevated.  Chronic pain, back and leg Chronic pain may contribute to elevated blood pressure. Scheduled for surgery next month, which may alleviate symptoms.  History of mixed hyperlipidemia Previously elevated cholesterol levels. Recent levels normal. No current medication due to past adverse reaction. - Checked cholesterol levels today.  History of tobacco use Heavy smoking history warrants consideration for lung cancer screening. - Referred to pulmonologist for evaluation of eligibility for CT lung cancer screening.  General Health Maintenance Discussed importance of vaccinations, including flu shot and shingles vaccine. Increased risk due to lack of chickenpox history. - Recommended shingles vaccine to prevent severe shingles outbreak. - Encouraged flu vaccination.  Follow up date to be determined after lab review and update.

## 2024-10-30 ENCOUNTER — Encounter: Admitting: Rehabilitation

## 2024-10-30 NOTE — Addendum Note (Signed)
 Addended by: DORINA DALLAS HERO on: 10/30/2024 05:23 PM   Modules accepted: Orders

## 2024-10-31 ENCOUNTER — Other Ambulatory Visit (INDEPENDENT_AMBULATORY_CARE_PROVIDER_SITE_OTHER)

## 2024-10-31 DIAGNOSIS — R7689 Other specified abnormal immunological findings in serum: Secondary | ICD-10-CM | POA: Diagnosis not present

## 2024-10-31 DIAGNOSIS — I10 Essential (primary) hypertension: Secondary | ICD-10-CM | POA: Diagnosis not present

## 2024-10-31 DIAGNOSIS — R350 Frequency of micturition: Secondary | ICD-10-CM | POA: Diagnosis not present

## 2024-10-31 DIAGNOSIS — E785 Hyperlipidemia, unspecified: Secondary | ICD-10-CM | POA: Diagnosis not present

## 2024-10-31 DIAGNOSIS — R7989 Other specified abnormal findings of blood chemistry: Secondary | ICD-10-CM | POA: Diagnosis not present

## 2024-11-01 LAB — CBC WITH DIFFERENTIAL/PLATELET
Basophils Absolute: 0 K/uL (ref 0.0–0.1)
Basophils Relative: 0.7 % (ref 0.0–3.0)
Eosinophils Absolute: 0.1 K/uL (ref 0.0–0.7)
Eosinophils Relative: 2.8 % (ref 0.0–5.0)
HCT: 40.5 % (ref 39.0–52.0)
Hemoglobin: 13.3 g/dL (ref 13.0–17.0)
Lymphocytes Relative: 56 % — ABNORMAL HIGH (ref 12.0–46.0)
Lymphs Abs: 2.7 K/uL (ref 0.7–4.0)
MCHC: 32.8 g/dL (ref 30.0–36.0)
MCV: 87.7 fl (ref 78.0–100.0)
Monocytes Absolute: 0.5 K/uL (ref 0.1–1.0)
Monocytes Relative: 11.4 % (ref 3.0–12.0)
Neutro Abs: 1.4 K/uL (ref 1.4–7.7)
Neutrophils Relative %: 29.1 % — ABNORMAL LOW (ref 43.0–77.0)
Platelets: 270 K/uL (ref 150.0–400.0)
RBC: 4.61 Mil/uL (ref 4.22–5.81)
RDW: 12.6 % (ref 11.5–15.5)
WBC: 4.8 K/uL (ref 4.0–10.5)

## 2024-11-01 LAB — PSA: PSA: 1.09 ng/mL (ref 0.10–4.00)

## 2024-11-01 LAB — LIPID PANEL
Cholesterol: 183 mg/dL (ref 0–200)
HDL: 51.9 mg/dL
LDL Cholesterol: 114 mg/dL — ABNORMAL HIGH (ref 0–99)
NonHDL: 130.82
Total CHOL/HDL Ratio: 4
Triglycerides: 84 mg/dL (ref 0.0–149.0)
VLDL: 16.8 mg/dL (ref 0.0–40.0)

## 2024-11-02 LAB — HEPATITIS C ANTIBODY: Hepatitis C Ab: REACTIVE — AB

## 2024-11-02 LAB — HEPATITIS C RNA QUANTITATIVE
HCV Quantitative Log: <1.18 {Log_IU}/mL
HCV Quantitative Log: 6.06 {Log_IU}/mL — ABNORMAL HIGH
HCV RNA, PCR, QN: <15 [IU]/mL
HCV RNA, PCR, QN: 1160000 [IU]/mL — ABNORMAL HIGH

## 2024-11-02 NOTE — Addendum Note (Signed)
 Addended by: DORINA DALLAS HERO on: 11/02/2024 04:26 PM   Modules accepted: Orders

## 2024-11-05 NOTE — Telephone Encounter (Signed)
 Copied from CRM #[id number]. Topic: Clinical - Lab/Test Results >> Nov 05, 2024  4:54 PM Lauren C wrote: Reason for CRM: Pt returning call from [facility] to explain Hep C test results. Requesting call back at [phone]

## 2024-11-05 NOTE — Addendum Note (Signed)
 Addended by: DORINA DALLAS HERO on: 11/05/2024 05:01 PM   Modules accepted: Orders

## 2024-11-06 ENCOUNTER — Telehealth: Payer: Self-pay

## 2024-11-06 NOTE — Telephone Encounter (Signed)
 Called pt and was able to get him scheduled for lab tomorrow at 8:30 am

## 2024-11-06 NOTE — Telephone Encounter (Signed)
 Called pt to get him scheduled for lab this week or next week put pt did not answer vm not set up

## 2024-11-07 ENCOUNTER — Other Ambulatory Visit (INDEPENDENT_AMBULATORY_CARE_PROVIDER_SITE_OTHER)

## 2024-11-07 ENCOUNTER — Telehealth: Payer: Self-pay | Admitting: Medical

## 2024-11-07 DIAGNOSIS — R7689 Other specified abnormal immunological findings in serum: Secondary | ICD-10-CM | POA: Diagnosis not present

## 2024-11-07 NOTE — Telephone Encounter (Signed)
 Pt dropped off paper to be filled out by pcp. Pt needs done by 11/15/24. Please call pt when ready to be picked up. Placed paper in pcps box.

## 2024-11-07 NOTE — Telephone Encounter (Signed)
 Spoke to pt and was able to confirm it was the same form that was filled out for a previous surg  No apt needed. Form will be filled out before the 4th and faxed over pt was understanding

## 2024-11-09 ENCOUNTER — Other Ambulatory Visit (HOSPITAL_BASED_OUTPATIENT_CLINIC_OR_DEPARTMENT_OTHER): Payer: Self-pay

## 2024-11-09 ENCOUNTER — Other Ambulatory Visit: Payer: Self-pay | Admitting: Medical

## 2024-11-10 ENCOUNTER — Other Ambulatory Visit (HOSPITAL_BASED_OUTPATIENT_CLINIC_OR_DEPARTMENT_OTHER): Payer: Self-pay

## 2024-11-10 MED ORDER — HYDROCHLOROTHIAZIDE 25 MG PO TABS
25.0000 mg | ORAL_TABLET | Freq: Every day | ORAL | 3 refills | Status: DC
  Filled 2024-11-10: qty 90, 90d supply, fill #0

## 2024-11-11 LAB — HEPATITIS C RNA QUANTITATIVE
HCV Quantitative Log: 6.04 {Log_IU}/mL — ABNORMAL HIGH
HCV RNA, PCR, QN: 1100000 [IU]/mL — ABNORMAL HIGH

## 2024-11-11 NOTE — Addendum Note (Signed)
 Addended by: DORINA DALLAS HERO on: 11/11/2024 04:01 PM   Modules accepted: Orders

## 2024-11-12 ENCOUNTER — Encounter: Payer: Self-pay | Admitting: Medical

## 2024-11-12 ENCOUNTER — Telehealth: Payer: Self-pay

## 2024-11-12 ENCOUNTER — Other Ambulatory Visit (HOSPITAL_BASED_OUTPATIENT_CLINIC_OR_DEPARTMENT_OTHER): Payer: Self-pay

## 2024-11-12 MED ORDER — HYDROCHLOROTHIAZIDE 25 MG PO TABS: 25.0000 mg | ORAL_TABLET | Freq: Every day | ORAL | 3 refills | Status: AC

## 2024-11-12 NOTE — Telephone Encounter (Signed)
 Pt called office to discuss HH needs after surgery.  Spoke with Dr. Luke who advised that the pt waits until after surgery to plan what HH needs he will have. Msg sent to pt via portal.

## 2024-11-12 NOTE — Telephone Encounter (Unsigned)
 Copied from CRM #[id number]. Topic: Clinical - Prescription Issue >> Nov 12, 2024  1:31 PM Willma R wrote: Reason for CRM: Patients prescription for hydrochlorothiazide  (HYDRODIURIL ) 25 MG tablet shows refilled on 11/10/24 but pharmacy does not show receipt of it. Patient asking to resend.  Patient can be reached at [phone]

## 2024-11-12 NOTE — Telephone Encounter (Signed)
 Called pt again he states he needs the date corrected on the form   Copied from CRM #[id number]. Topic: General - Other >> Nov 12, 2024  2:46 PM Sophia H wrote: Reason for CRM: Certification form for home health needs to be filled out for the whole year, he just needs dates corrected. Patient also sent in a mychart message regarding this.

## 2024-11-12 NOTE — Telephone Encounter (Signed)
 Rx resent.

## 2024-11-12 NOTE — Telephone Encounter (Signed)
 Called pharmacy and confirmed pt med is ready for pick up Called pt and notified him as well  Copied from CRM #[id number]. Topic: Clinical - Prescription Issue >> Nov 12, 2024  1:31 PM Willma R wrote: Reason for CRM: Patients prescription for hydrochlorothiazide  (HYDRODIURIL ) 25 MG tablet shows refilled on 11/10/24 but pharmacy does not show receipt of it. Patient asking to resend.  Patient can be reached at [phone]

## 2024-11-12 NOTE — Telephone Encounter (Signed)
 Copied from CRM #[id number]. Topic: Clinical - Medical Advice >> Nov 12, 2024  1:34 PM Willma R wrote: Reason for CRM: Patient calling and requesting to speak to a nurse. states he was supposed to have a referral for a gastroenterologist but no information has been received for him. Also states he has a questions about a recertification for his insurance and the times notated for his treatment.   Patient can be reached at [phone]

## 2024-11-13 ENCOUNTER — Telehealth: Payer: Self-pay

## 2024-11-13 NOTE — Telephone Encounter (Signed)
 Copied from CRM #[id number]. Topic: General - Other >> Nov 12, 2024  2:46 PM Sophia H wrote: Reason for CRM: Certification form for home health needs to be filled out for the whole year, he just needs dates corrected. Patient also sent in a mychart message regarding this. >> Nov 13, 2024 11:55 AM Rosina BIRCH wrote: Patient would like the nurse to call him in regards to a health form [phone]

## 2024-11-13 NOTE — Telephone Encounter (Signed)
 Called pt back to confirm more questions. He said he was NOT billed so that must have been a misunderstanding. He said that he was going to collect the bills if needed? Besides that he said everything was covered. He also says he uses PT in our building across the hall the number is [phone]

## 2024-11-15 NOTE — Telephone Encounter (Signed)
 DOS 11/15/24  Procedure Lateral lumbar interbody fusion at Lumbar 2-3, Percutaneous screw fixation Lumbar 2-3  Tess Cramp, MD  POST-OP APPT 11/30/24 @ 9:40 AM

## 2024-11-16 ENCOUNTER — Ambulatory Visit: Admitting: Medical

## 2024-11-16 ENCOUNTER — Ambulatory Visit (HOSPITAL_BASED_OUTPATIENT_CLINIC_OR_DEPARTMENT_OTHER): Admitting: Orthopaedic Surgery

## 2024-11-17 ENCOUNTER — Other Ambulatory Visit (HOSPITAL_BASED_OUTPATIENT_CLINIC_OR_DEPARTMENT_OTHER): Payer: Self-pay

## 2024-11-17 MED ORDER — TAMSULOSIN HCL 0.4 MG PO CAPS
0.4000 mg | ORAL_CAPSULE | Freq: Every day | ORAL | 0 refills | Status: AC
  Filled 2024-11-17: qty 90, 90d supply, fill #0

## 2024-11-17 MED ORDER — HYDROCODONE-ACETAMINOPHEN 5-325 MG PO TABS
1.0000 | ORAL_TABLET | Freq: Four times a day (QID) | ORAL | 0 refills | Status: AC | PRN
  Filled 2024-11-17: qty 30, 8d supply, fill #0

## 2024-11-18 ENCOUNTER — Other Ambulatory Visit: Payer: Self-pay

## 2024-11-19 ENCOUNTER — Telehealth: Payer: Self-pay | Admitting: Medical

## 2024-11-19 ENCOUNTER — Ambulatory Visit: Admitting: Medical

## 2024-11-19 ENCOUNTER — Telehealth: Payer: Self-pay

## 2024-11-19 ENCOUNTER — Other Ambulatory Visit (HOSPITAL_BASED_OUTPATIENT_CLINIC_OR_DEPARTMENT_OTHER): Payer: Self-pay

## 2024-11-19 NOTE — Telephone Encounter (Signed)
 Called pt and got the name of the agency witch he stated was [facility] when googling the agency it says the company is located in [place] . Tried contacting the pt again x2 with no answer it says the vm is full

## 2024-11-19 NOTE — Telephone Encounter (Signed)
 Will do!

## 2024-11-19 NOTE — Telephone Encounter (Signed)
 If by 9 am tomorrow we don't get paperwork from agency that has provided homehealth services then call them again and ask them to fax over papwork. If give summary of prior services and plan

## 2024-11-19 NOTE — Telephone Encounter (Signed)
 Called pt to get the name of the [facility] agency but didn't answer left vm

## 2024-11-19 NOTE — Telephone Encounter (Signed)
 Called to schedule NP appt. Vmail full.

## 2024-11-19 NOTE — Telephone Encounter (Signed)
 [facility] - based in [place], [place]   Talked with staff regarding the [facility] forearm and her certification..  Apparently he has been getting primarily home health care through an aide who has been helping him with activities of daily living.  She explained that patient has a supplemental plan that covers him if services are needed but he is not getting physical therapy or occupational therapy to their agency it is only the home health aide portion and he can get skilled nursing through them as well as needed.  I filled out the form previously limited timeframe as he was getting surgery in the past and did not get certification.  Beyond October 24 as usually get physical therapy and OT updates if patient is going through therapy.  Patient is coming in tomorrow for office visit and we will fill out all form and give the date from October 05, 2024 through October 05, 2025 primarily for home health aide help with ADLs and I will also probably check skilled nursing as well as needed.  Patient just had another orthopedic surgery recently.

## 2024-11-19 NOTE — Telephone Encounter (Signed)
 Spoke with hh and they said they have no office notes only a care plan sheet witch she will be faxing over. She states he needs help lifting transferring and etc

## 2024-11-19 NOTE — Telephone Encounter (Signed)
 Talked with staff

## 2024-11-19 NOTE — Telephone Encounter (Signed)
 Called rehab to see if they could give me info on what hh agency pt uses. She didn't understand why pt needed pt as he was seeing them and was D/C 10/19/24 . I then called neuro where he had his surgery and they do not know anything about a previous agency although she did notify me when he spoke to a child psychotherapist at the hospital he stated he had no preference of witch agency to use? Not sure if I'm going to be able to get any information or office notes before his apt tomorrow but will keep trying

## 2024-11-19 NOTE — Telephone Encounter (Unsigned)
 Copied from CRM [id number]. Topic: General - Call Back - No Documentation >> Nov 19, 2024 11:15 AM Ashley R wrote: Reason for RMF:rjoopwh back from missed call 12/8, 11AM -  confirming name of agency is [facility] - based in [place], [place] ([phone])

## 2024-11-20 ENCOUNTER — Other Ambulatory Visit (HOSPITAL_BASED_OUTPATIENT_CLINIC_OR_DEPARTMENT_OTHER): Payer: Self-pay

## 2024-11-20 ENCOUNTER — Ambulatory Visit (HOSPITAL_BASED_OUTPATIENT_CLINIC_OR_DEPARTMENT_OTHER)
Admission: RE | Admit: 2024-11-20 | Discharge: 2024-11-20 | Disposition: A | Source: Ambulatory Visit | Attending: Medical | Admitting: Medical

## 2024-11-20 ENCOUNTER — Ambulatory Visit: Admitting: Medical

## 2024-11-20 ENCOUNTER — Telehealth: Payer: Self-pay

## 2024-11-20 ENCOUNTER — Encounter: Payer: Self-pay | Admitting: Medical

## 2024-11-20 VITALS — BP 155/80 | HR 89 | Temp 97.8°F | Resp 15 | Ht 67.0 in | Wt 160.0 lb

## 2024-11-20 DIAGNOSIS — R7689 Other specified abnormal immunological findings in serum: Secondary | ICD-10-CM | POA: Diagnosis not present

## 2024-11-20 DIAGNOSIS — Z9889 Other specified postprocedural states: Secondary | ICD-10-CM

## 2024-11-20 DIAGNOSIS — M5416 Radiculopathy, lumbar region: Secondary | ICD-10-CM

## 2024-11-20 DIAGNOSIS — R140 Abdominal distension (gaseous): Secondary | ICD-10-CM

## 2024-11-20 DIAGNOSIS — M545 Low back pain, unspecified: Secondary | ICD-10-CM

## 2024-11-20 DIAGNOSIS — K5900 Constipation, unspecified: Secondary | ICD-10-CM

## 2024-11-20 DIAGNOSIS — E871 Hypo-osmolality and hyponatremia: Secondary | ICD-10-CM

## 2024-11-20 MED ORDER — BENZONATATE 100 MG PO CAPS
100.0000 mg | ORAL_CAPSULE | Freq: Three times a day (TID) | ORAL | 0 refills | Status: AC | PRN
  Filled 2024-11-20: qty 30, 10d supply, fill #0

## 2024-11-20 NOTE — Progress Notes (Signed)
 Subjective:    Patient ID: Phillip Hughes, male    DOB: 06/25/1956, 68 y.o.   MRN: [id number]  HPI   Phillip Hughes is a 68 year old male who presents with constipation following recent surgery.  He underwent surgery at [facility] last Wednesday and has had no bowel movement since the day before surgery. He used Colace starting yesterday without relief. He received Miralax in the hospital but has not taken it at home.  No fever, no chills or sweats. No nausea or vomiting.   He is taking hydrocodone  for postoperative pain and has been trying to limit its use to help his bowels. He has abdominal pain and bloating.  He can walk around the house with a walker and assistance. He eats independently and controls his bladder but needs help with dressing, toileting, and transfers to prevent falls. Needs form filled out for home health        Number given to fax for home health care [phone]  Review of Systems  Constitutional:  Negative for chills and fatigue.  Respiratory:  Negative for cough, chest tightness, shortness of breath and wheezing.   Cardiovascular:  Negative for chest pain and palpitations.  Gastrointestinal:  Positive for abdominal distention and constipation. Negative for abdominal pain, blood in stool, nausea and vomiting.  Musculoskeletal:  Positive for gait problem. Negative for myalgias.  Skin:  Negative for rash.  Neurological:  Negative for dizziness and numbness.  Hematological:  Negative for adenopathy.  Psychiatric/Behavioral:  Negative for behavioral problems, decreased concentration and dysphoric mood.     Past Medical History:  Diagnosis Date   Hypertension    Sciatica    Sinusitis      Social History   Socioeconomic History   Marital status: Married    Spouse name: Not on file   Number of children: Not on file   Years of education: Not on file   Highest education level: 11th grade  Occupational History   Not on file  Tobacco Use   Smoking  status: Former    Current packs/day: 1.00    Average packs/day: 1 pack/day for 28.0 years (28.0 ttl pk-yrs)    Types: Cigarettes   Smokeless tobacco: Never  Vaping Use   Vaping status: Never Used  Substance and Sexual Activity   Alcohol use: Yes    Comment: rarely   Drug use: No   Sexual activity: Not on file  Other Topics Concern   Not on file  Social History Narrative   Not on file   Social Drivers of Health   Financial Resource Strain: Low Risk  (10/24/2024)   Overall Financial Resource Strain (CARDIA)    Difficulty of Paying Living Expenses: Not hard at all  Food Insecurity: Low Risk (11/15/2024)   Received from [facility]   Hunger Vital Sign    Within the past 12 months, you worried that your food would run out before you got money to buy more: Never true    Within the past 12 months, the food you bought just didn't last and you didn't have money to get more. : Never true  Transportation Needs: No Transportation Needs (11/15/2024)   Received from [facility]    In the past 12 months, has lack of reliable transportation kept you from medical appointments, meetings, work or from getting things needed for daily living? : No  Physical Activity: Sufficiently Active (10/24/2024)   Exercise Vital Sign    Days of Exercise per  Week: 5 days    Minutes of Exercise per Session: 30 min  Stress: No Stress Concern Present (10/24/2024)   [employer] of Occupational Health - Occupational Stress Questionnaire    Feeling of Stress: Not at all  Social Connections: Moderately Isolated (10/24/2024)   Social Connection and Isolation Panel    Frequency of Communication with Friends and Family: Three times a week    Frequency of Social Gatherings with Friends and Family: Once a week    Attends Religious Services: Never    Database Administrator or Organizations: No    Attends Engineer, Structural: Not on file    Marital Status: Married  Catering Manager  Violence: Not At Risk (02/28/2024)   Humiliation, Afraid, Rape, and Kick questionnaire    Fear of Current or Ex-Partner: No    Emotionally Abused: No    Physically Abused: No    Sexually Abused: No    Past Surgical History:  Procedure Laterality Date   BACK SURGERY     HERNIA REPAIR     HIP SURGERY     NECK SURGERY      No family history on file.  Allergies  Allergen Reactions   Gabapentin Other (See Comments)    Mood Swings and Depression Mood Swings and Depression Mood Swings and Depression Mood Swings and Depression    Nsaids Other (See Comments)    Joint pain and swelling ( updated 02/2024)   Statins Other (See Comments)    Joint pain and swelling ( updated 02/2024)   Other Dermatitis    Gold, Silver, fashion jewelry causes skin rash    Current Outpatient Medications on File Prior to Visit  Medication Sig Dispense Refill   diclofenac  Sodium (VOLTAREN ) 1 % GEL Apply 2 g topically 4 (four) times daily as needed. 100 g 0   hydrochlorothiazide  (HYDRODIURIL ) 25 MG tablet Take 1 tablet (25 mg total) by mouth daily. 90 tablet 3   HYDROcodone -acetaminophen  (NORCO/VICODIN) 5-325 MG tablet Take 1 tablet by mouth every 6 (six) hours as needed for moderate pain (4-6). 12 tablet 0   HYDROcodone -acetaminophen  (NORCO/VICODIN) 5-325 MG tablet Take 1 tablet by mouth every 6 (six) hours as needed for moderate pain (4-6). 30 tablet 0   oxyCODONE -acetaminophen  (PERCOCET/ROXICET) 5-325 MG tablet Take 1 tablet by mouth every 4 (four) hours as needed for moderate pain (4-6) or severe pain (7-10). 24 tablet 0   tamsulosin  (FLOMAX ) 0.4 MG CAPS capsule Take 1 capsule (0.4 mg total) by mouth daily. 90 capsule 0   No current facility-administered medications on file prior to visit.    BP (!) 155/80   Pulse 89   Temp 97.8 F (36.6 C) (Oral)   Resp 15   Ht 5' 7 (1.702 m)   Wt 160 lb (72.6 kg)   SpO2 97%   BMI 25.06 kg/m        Objective:   Physical Exam  General Mental Status-  Alert. General Appearance- Not in acute distress.   Skin General: Color- Normal Color. Moisture- Normal Moisture.  Neck Carotid Arteries- Normal color. Moisture- Normal Moisture. No carotid bruits. No JVD.  Chest and Lung Exam Auscultation: Breath Sounds:-Normal.  Cardiovascular Auscultation:Rythm- Regular. Murmurs & Other Heart Sounds:Auscultation of the heart reveals- No Murmurs.  Abdomen Inspection:-Inspeection Normal. Palpation/Percussion:Note:No mass. Palpation and Percussion of the abdomen reveal- Non Tender, mild Distended + BS, no rebound or guarding.   Neurologic Cranial Nerve exam:- CN III-XII intact(No nystagmus), symmetric smile. Strength:- 5/5 equal and symmetric  strength both upper and lower extremities.       Assessment & Plan:   Patient Instructions  Constipation with abdominal distension Post-operative constipation likely due to hydrocodone . Differential includes bowel obstruction vs constipation post surgery) - Ordered abdominal x-ray to rule out bowel obstruction. - Advised taking Miralax and Colace together. - Consider magnesium citrate if no bowel movement by tomorrow -hold hydrodocone use this evening and hydrate well. - Recommend emergency department evaluation if x-ray shows emergent findings.  Status post lumbar surgery Ongoing recovery with pain managed by hydrocodone , contributing to constipation. - Continue physical therapy as arranged by orthopedics. - Advised reducing hydrocodone  use to manage constipation.  Hyponatremia Noted on recent hospital labs. - Ordered CBC and metabolic panel to assess current sodium levels.  Htn -bp elevated but he did not take bp med today and he is in pain.  -advised take bp med when you get home.  Filled out unl form for home health care and skilled nursing after long discussion   Follow up date to be determined after xray and lab review   Dallas Maxwell, PA-C   I personally spent a total of 45  minutes in the care of the patient today including performing a medically appropriate exam/evaluation, counseling and educating, placing orders, documenting clinical information in the EHR, and filling out somewhat complicated form. Discussing how would fill out.SABRA

## 2024-11-20 NOTE — Patient Instructions (Addendum)
 Constipation with abdominal distension Post-operative constipation likely due to hydrocodone . Differential includes bowel obstruction vs constipation post surgery) - Ordered abdominal x-ray to rule out bowel obstruction. - Advised taking Miralax and Colace together. - Consider magnesium citrate if no bowel movement by tomorrow -hold hydrodocone use this evening and hydrate well. - Recommend emergency department evaluation if x-ray shows emergent findings.  Status post lumbar surgery Ongoing recovery with pain managed by hydrocodone , contributing to constipation. - Continue physical therapy as arranged by orthopedics. - Advised reducing hydrocodone  use to manage constipation.  Hyponatremia Noted on recent hospital labs. - Ordered CBC and metabolic panel to assess current sodium levels.  Htn -bp elevated but he did not take bp med today and he is in pain.  -advised take bp med when you get home.  Filled out unl form for home health care and skilled nursing after long discussion   Follow up date to be determined after xray and lab review

## 2024-11-20 NOTE — Telephone Encounter (Signed)
 Called [facility] agency again due to no receiving fax. Said she will send over pt records again  I provided fax number for her

## 2024-11-21 ENCOUNTER — Ambulatory Visit: Payer: Self-pay | Admitting: Medical

## 2024-11-21 ENCOUNTER — Encounter (HOSPITAL_BASED_OUTPATIENT_CLINIC_OR_DEPARTMENT_OTHER): Payer: Self-pay | Admitting: Emergency Medicine

## 2024-11-21 ENCOUNTER — Ambulatory Visit: Payer: Self-pay

## 2024-11-21 ENCOUNTER — Emergency Department (HOSPITAL_BASED_OUTPATIENT_CLINIC_OR_DEPARTMENT_OTHER)
Admission: EM | Admit: 2024-11-21 | Discharge: 2024-11-21 | Disposition: A | Discharge status: Home / Self Care | Attending: Emergency Medicine | Admitting: Emergency Medicine

## 2024-11-21 ENCOUNTER — Telehealth: Payer: Self-pay

## 2024-11-21 ENCOUNTER — Other Ambulatory Visit: Payer: Self-pay

## 2024-11-21 DIAGNOSIS — R109 Unspecified abdominal pain: Secondary | ICD-10-CM | POA: Diagnosis present

## 2024-11-21 DIAGNOSIS — K5903 Drug induced constipation: Secondary | ICD-10-CM | POA: Diagnosis not present

## 2024-11-21 DIAGNOSIS — T402X5A Adverse effect of other opioids, initial encounter: Secondary | ICD-10-CM | POA: Diagnosis not present

## 2024-11-21 LAB — CBC WITH DIFFERENTIAL/PLATELET
Basophils Absolute: 0 K/uL (ref 0.0–0.1)
Basophils Relative: 1 % (ref 0.0–3.0)
Eosinophils Absolute: 0.3 K/uL (ref 0.0–0.7)
Eosinophils Relative: 5.4 % — ABNORMAL HIGH (ref 0.0–5.0)
HCT: 35.7 % — ABNORMAL LOW (ref 39.0–52.0)
Hemoglobin: 11.9 g/dL — ABNORMAL LOW (ref 13.0–17.0)
Lymphocytes Relative: 42.5 % (ref 12.0–46.0)
Lymphs Abs: 2.1 K/uL (ref 0.7–4.0)
MCHC: 33.4 g/dL (ref 30.0–36.0)
MCV: 86.5 fl (ref 78.0–100.0)
Monocytes Absolute: 0.6 K/uL (ref 0.1–1.0)
Monocytes Relative: 11.8 % (ref 3.0–12.0)
Neutro Abs: 1.9 K/uL (ref 1.4–7.7)
Neutrophils Relative %: 39.3 % — ABNORMAL LOW (ref 43.0–77.0)
Platelets: 274 K/uL (ref 150.0–400.0)
RBC: 4.12 Mil/uL — ABNORMAL LOW (ref 4.22–5.81)
RDW: 12.5 % (ref 11.5–15.5)
WBC: 4.9 K/uL (ref 4.0–10.5)

## 2024-11-21 LAB — COMPREHENSIVE METABOLIC PANEL WITH GFR
ALT: 34 U/L (ref 0–53)
AST: 34 U/L (ref 0–37)
Albumin: 4.2 g/dL (ref 3.5–5.2)
Alkaline Phosphatase: 75 U/L (ref 39–117)
BUN: 13 mg/dL (ref 6–23)
CO2: 32 meq/L (ref 19–32)
Calcium: 9.8 mg/dL (ref 8.4–10.5)
Chloride: 97 meq/L (ref 96–112)
Creatinine, Ser: 0.78 mg/dL (ref 0.40–1.50)
GFR: 90.83 mL/min (ref >60.00)
Glucose, Bld: 95 mg/dL (ref 70–99)
Potassium: 3.9 meq/L (ref 3.5–5.1)
Sodium: 138 meq/L (ref 135–145)
Total Bilirubin: 0.8 mg/dL (ref 0.2–1.2)
Total Protein: 7.6 g/dL (ref 6.0–8.3)

## 2024-11-21 NOTE — ED Triage Notes (Signed)
 Pt states back Sx about 1 week ago, has not had BM since. Seen by PCP told to stop hydrocodone  and use colace and miralax.

## 2024-11-21 NOTE — Telephone Encounter (Signed)
 [facility] home health forms faxed with confirmation received

## 2024-11-21 NOTE — ED Provider Notes (Signed)
 [name] [facility]  Provider Note  CSN: [id number] Arrival date & time: 11/21/24 0423  History Chief Complaint  Patient presents with   Constipation    Phillip Hughes is a 68 y.o. male here with wife for evaluation of abdominal pain. He recently had a back surgery and has not had BM in about 7 days post-op. He has been taking hydrocodone  for post-op pain. He has been taking colace for constipation but nothing else. Saw PCP on 12/9 and had xray showing increased stool burden in R colon, no SBO. He was advised to start Miralax which he has taken one dose of. He was having some cramping abdominal pain during the night prompting and ED visit. No nausea, vomiting or dysuria.    Home Medications Prior to Admission medications   Medication Sig Start Date End Date Taking? Authorizing Provider  benzonatate  (TESSALON ) 100 MG capsule Take 1 capsule (100 mg total) by mouth 3 (three) times daily as needed for cough. 11/20/24   Saguier, Dallas, PA-C  diclofenac  Sodium (VOLTAREN ) 1 % GEL Apply 2 g topically 4 (four) times daily as needed. 11/28/22   Long, Fonda MATSU, MD  hydrochlorothiazide  (HYDRODIURIL ) 25 MG tablet Take 1 tablet (25 mg total) by mouth daily. 11/12/24   Saguier, Dallas, PA-C  HYDROcodone -acetaminophen  (NORCO/VICODIN) 5-325 MG tablet Take 1 tablet by mouth every 6 (six) hours as needed for moderate pain (4-6). 07/20/24     HYDROcodone -acetaminophen  (NORCO/VICODIN) 5-325 MG tablet Take 1 tablet by mouth every 6 (six) hours as needed for moderate pain (4-6). 11/17/24     oxyCODONE -acetaminophen  (PERCOCET/ROXICET) 5-325 MG tablet Take 1 tablet by mouth every 4 (four) hours as needed for moderate pain (4-6) or severe pain (7-10). 07/05/24     tamsulosin  (FLOMAX ) 0.4 MG CAPS capsule Take 1 capsule (0.4 mg total) by mouth daily. 11/17/24        Allergies    Gabapentin, Nsaids, Statins, and Other   Review of Systems   Review of Systems Please see HPI for  pertinent positives and negatives  Physical Exam BP (!) 157/113 (BP Location: Left Arm)   Pulse 85   Temp 97.8 F (36.6 C) (Oral)   Resp 20   Ht 5' 7 (1.702 m)   Wt 72.5 kg   SpO2 100%   BMI 25.03 kg/m   Physical Exam Vitals and nursing note reviewed.  Constitutional:      Appearance: Normal appearance.  HENT:     Head: Normocephalic and atraumatic.     Nose: Nose normal.     Mouth/Throat:     Mouth: Mucous membranes are moist.  Eyes:     Extraocular Movements: Extraocular movements intact.     Conjunctiva/sclera: Conjunctivae normal.  Cardiovascular:     Rate and Rhythm: Normal rate.  Pulmonary:     Effort: Pulmonary effort is normal.     Breath sounds: Normal breath sounds.  Abdominal:     General: Abdomen is flat. There is no distension.     Palpations: Abdomen is soft.     Tenderness: There is abdominal tenderness (mild right side). There is no guarding.  Musculoskeletal:        General: No swelling. Normal range of motion.     Cervical back: Neck supple.  Skin:    General: Skin is warm and dry.  Neurological:     General: No focal deficit present.     Mental Status: He is alert.  Psychiatric:  Mood and Affect: Mood normal.     ED Results / Procedures / Treatments   EKG None  Procedures Procedures  Medications Ordered in the ED Medications - No data to display  Initial Impression and Plan  Patient here with constipation, abdomen is benign, xray from earlier in the day shows increased stool burden without other complications. Recommend he take Miralax with increased frequency, try Mag Citrate as well. Limit use of hydrocodone  and ensure adequate hydration. No indication for ED labs or imaging studies at this time. PCP follow up, RTED for any other concerns.    ED Course       MDM Rules/Calculators/A&P Medical Decision Making Problems Addressed: Drug-induced constipation: acute illness or injury  Risk OTC drugs.     Final Clinical  Impression(s) / ED Diagnoses Final diagnoses:  Drug-induced constipation    Rx / DC Orders ED Discharge Orders     None        Roselyn Carlin NOVAK, MD 11/21/24 [phone]

## 2024-11-21 NOTE — Telephone Encounter (Signed)
 FYI Only or Action Required?: Action required by provider: clinical question for provider and update on patient condition.  Patient was last seen in primary care on 11/20/2024 by Dorina Loving, PA-C.  Called Nurse Triage reporting Medication Adherence.    Triage Disposition: Call PCP Now  Patient/caregiver understands and will follow disposition?: Yes          Copied from CRM #[id number]. Topic: Clinical - Red Word Triage >> Nov 21, 2024  3:35 PM Drema MATSU wrote: Red Word that prompted transfer to Nurse Triage: Patient is requesting a callback and has had 3 bowel movements since yesterday. He wants to know if he can get back on the HYDROcodone -acetaminophen  (NORCO/VICODIN) 5-325 MG tablet for his pain. He has been experiencing pain since the procedure. Reason for Disposition  [1] Caller requests to speak ONLY to PCP AND [2] URGENT question  Answer Assessment - Initial Assessment Questions 1. REASON FOR CALL or QUESTION: What is your reason for calling today? or How can I best   Patient called in to report to PCP that he has had 4 bowel movements and now wishes to start back taking the HYDROcodone -acetaminophen  (NORCO/VICODIN) 5-325 MG tablet. He was advised by PCP to stop until he had a bowel movement. He also wants the provider to know He took magnesium citrate which caused the first couple of BMs. He is also taking Colace, Miralax.  Protocols used: PCP Call - No Triage-A-AH

## 2024-11-27 ENCOUNTER — Telehealth: Payer: Self-pay

## 2024-11-27 NOTE — Telephone Encounter (Signed)
 Called pt and he says that insurance claims that they never received unl hh forms. I explained to him that I sent it off on the 10th with confirmation received but will refax it off again    Copied from CRM #[id number]. Topic: General - Other >> Nov 26, 2024  4:44 PM Dedra B wrote: Reason for CRM: Pt would like a call regarding insurance form that his PCP was supposed to fill out and send to the insurance.

## 2024-11-27 NOTE — Telephone Encounter (Signed)
 [facility] forms re faxed with confirmation received

## 2024-11-30 ENCOUNTER — Other Ambulatory Visit (HOSPITAL_BASED_OUTPATIENT_CLINIC_OR_DEPARTMENT_OTHER): Payer: Self-pay

## 2024-11-30 MED ORDER — GABAPENTIN 300 MG PO CAPS
300.0000 mg | ORAL_CAPSULE | Freq: Three times a day (TID) | ORAL | 3 refills | Status: AC
  Filled 2024-11-30: qty 270, 90d supply, fill #0

## 2024-11-30 NOTE — Telephone Encounter (Signed)
 Pt called office, two identifiers used.   Pt stated that his prescription that was sent this morning had not been received by his pharmacy. He was concerned because they are not open on the weekend. Informed him that the prescription was sent and received by [facility] in [facility]. He stated that was not the correct pharmacy. Informed him that he had 3 different pharmacies on his chart and he instructed all of the pharmacies be removed except for [facility]. Other pharmacies removed.  Called [facility] and asked that the prescription fill be canceled.  [employer] and asked for medication to be transferred. The pharmacist stated that she would either need it to be resent or a verbal order placed. Verbal order given. Confirmed understanding, no further questions at this time.

## 2024-12-03 ENCOUNTER — Telehealth: Payer: Self-pay

## 2024-12-03 NOTE — Telephone Encounter (Signed)
 Pt came to office today stating the old [facility] HH forms were denied by insurance. He brought a new form by the name of [facility] witch ask the same questions. He states the dates need to be changed. I tried to call the company to see what needs to be corrected on the form but no one answers. I called x2 and it states that no one is available to answer. Form placed in pcp folder with old forms

## 2024-12-03 NOTE — Telephone Encounter (Signed)
 New [facility] form copied and placed in file cabinet per PCP

## 2024-12-03 NOTE — Telephone Encounter (Unsigned)
 Copied from CRM #[id number]. Topic: General - Call Back - No Documentation >> Dec 03, 2024  3:27 PM Rea C wrote: Reason for CRM: Patient would appreciate a callback from Dr. GORMAN nurse.   Patient stated the form he had sent back to the insurance company, the reason why they didn't accept it is because Dr. GORMAN didn't check home health care aide. He just checked off general skilled nursing. Home Health care aide/skilled nursing needs to be checked off and patient would still appreciate the call.   [phone] (M)

## 2024-12-11 ENCOUNTER — Telehealth: Payer: Self-pay

## 2024-12-11 NOTE — Telephone Encounter (Signed)
 Pt has explained this over the phn and in person    Copied from CRM #[id number]. Topic: Clinical - Medical Advice >> Dec 10, 2024  3:06 PM Shereese L wrote: Reason for CRM: Patient called in and stated that the home health aide box needs to be checked on the [facility] form that was received by the office. CB#[phone]

## 2024-12-11 NOTE — Telephone Encounter (Signed)
 Copied from CRM #[id number]. Topic: Clinical - Medical Advice >> Dec 10, 2024  3:06 PM Shereese L wrote: Reason for CRM: Patient called in and stated that the home health aide box needs to be checked on the [facility] form that was received by the office. [phone] >> Dec 11, 2024  9:58 AM Mesmerise C wrote: Patient wanting a call back from [facility] states that the forms were missing the box the be checked for home care aid advised of message from 12/22 that Dr. Dorina will be in office tomorrow to fill it out and will have her call him once completed at [phone]

## 2024-12-12 NOTE — Telephone Encounter (Signed)
 [facility] form faxed off to both [facility] and [facility] with confirmation received  Called pt and notified him

## 2024-12-18 NOTE — Addendum Note (Signed)
 Addended by: DORINA DALLAS DORINA PA-C M on: 12/18/2024 09:28 PM   Modules accepted: Orders

## 2024-12-19 ENCOUNTER — Telehealth: Payer: Self-pay

## 2024-12-19 ENCOUNTER — Ambulatory Visit

## 2024-12-19 NOTE — Telephone Encounter (Signed)
 Copied from CRM [id number]. Topic: Clinical - Request for Lab/Test Order >> Dec 19, 2024 12:28 PM Phillip Hughes wrote: Reason for CRM: Patient stated an US  Abdomen Limited has been ordered from his PCP. The patient is calling in asking why this US  has been ordered, as he doesn't believe one is needed.   PCP/Care Team Please Advise

## 2024-12-20 ENCOUNTER — Telehealth: Payer: Self-pay

## 2024-12-20 NOTE — Telephone Encounter (Signed)
 Called pt and spoke to him about pcps advice

## 2024-12-20 NOTE — Telephone Encounter (Signed)
 Called over to [facility] and they did verify he has an apt march 17th looks like the referral was sent to [facility] first and they are the ones that wrote the note stating they dont treat heap c. Then it was routed to [facility]

## 2024-12-26 ENCOUNTER — Encounter: Payer: Self-pay | Admitting: Medical

## 2025-01-04 ENCOUNTER — Other Ambulatory Visit (HOSPITAL_BASED_OUTPATIENT_CLINIC_OR_DEPARTMENT_OTHER): Payer: Self-pay

## 2025-01-04 MED ORDER — JOURNAVX 50 MG PO TABS
ORAL_TABLET | ORAL | 0 refills | Status: AC
  Filled 2025-01-04: qty 30, 15d supply, fill #0

## 2025-01-07 ENCOUNTER — Other Ambulatory Visit (HOSPITAL_BASED_OUTPATIENT_CLINIC_OR_DEPARTMENT_OTHER): Payer: Self-pay

## 2025-01-18 ENCOUNTER — Encounter: Payer: Self-pay | Admitting: Medical

## 2025-01-21 ENCOUNTER — Ambulatory Visit: Admitting: Medical

## 2025-03-05 ENCOUNTER — Ambulatory Visit
